# Patient Record
Sex: Male | Born: 1935
Health system: Southern US, Community
[De-identification: ages and names within clinical notes are randomized; demographics above are authoritative.]

## PROBLEM LIST (undated history)

## (undated) DIAGNOSIS — I358 Other nonrheumatic aortic valve disorders: Secondary | ICD-10-CM

## (undated) DIAGNOSIS — G935 Compression of brain: Secondary | ICD-10-CM

## (undated) DIAGNOSIS — E039 Hypothyroidism, unspecified: Secondary | ICD-10-CM

## (undated) DIAGNOSIS — I219 Acute myocardial infarction, unspecified: Secondary | ICD-10-CM

## (undated) DIAGNOSIS — I10 Essential (primary) hypertension: Secondary | ICD-10-CM

## (undated) DIAGNOSIS — I509 Heart failure, unspecified: Secondary | ICD-10-CM

## (undated) DIAGNOSIS — K56609 Unspecified intestinal obstruction, unspecified as to partial versus complete obstruction: Secondary | ICD-10-CM

## (undated) DIAGNOSIS — Z8701 Personal history of pneumonia (recurrent): Secondary | ICD-10-CM

## (undated) DIAGNOSIS — I351 Nonrheumatic aortic (valve) insufficiency: Secondary | ICD-10-CM

## (undated) DIAGNOSIS — J40 Bronchitis, not specified as acute or chronic: Secondary | ICD-10-CM

## (undated) DIAGNOSIS — E785 Hyperlipidemia, unspecified: Secondary | ICD-10-CM

## (undated) DIAGNOSIS — K219 Gastro-esophageal reflux disease without esophagitis: Secondary | ICD-10-CM

## (undated) DIAGNOSIS — K5790 Diverticulosis of intestine, part unspecified, without perforation or abscess without bleeding: Secondary | ICD-10-CM

## (undated) DIAGNOSIS — F0390 Unspecified dementia without behavioral disturbance: Secondary | ICD-10-CM

## (undated) DIAGNOSIS — H9190 Unspecified hearing loss, unspecified ear: Secondary | ICD-10-CM

## (undated) HISTORY — DX: Personal history of pneumonia (recurrent): Z87.01

## (undated) HISTORY — PX: ABDOMINAL HERNIA REPAIR: SHX539

## (undated) HISTORY — DX: Gastro-esophageal reflux disease without esophagitis: K21.9

## (undated) HISTORY — PX: CHOLECYSTECTOMY: SHX55

## (undated) HISTORY — DX: Hyperlipidemia, unspecified: E78.5

## (undated) HISTORY — DX: Diverticulosis of intestine, part unspecified, without perforation or abscess without bleeding: K57.90

## (undated) HISTORY — PX: EXPLORATORY LAPAROTOMY W/ BOWEL RESECTION: SHX1544

## (undated) HISTORY — DX: Hypothyroidism, unspecified: E03.9

## (undated) HISTORY — DX: Unspecified intestinal obstruction, unspecified as to partial versus complete obstruction: K56.609

## (undated) HISTORY — DX: Unspecified hearing loss, unspecified ear: H91.90

## (undated) HISTORY — PX: TRANSURETHRAL RESECTION OF PROSTATE: SHX73

## (undated) HISTORY — DX: Other nonrheumatic aortic valve disorders: I35.8

## (undated) HISTORY — PX: CATARACT EXTRACTION: SUR2

## (undated) HISTORY — DX: Acute myocardial infarction, unspecified: I21.9

## (undated) HISTORY — PX: EYE SURGERY: SHX253

## (undated) HISTORY — DX: Nonrheumatic aortic (valve) insufficiency: I35.1

---

## 1994-05-29 DIAGNOSIS — I219 Acute myocardial infarction, unspecified: Secondary | ICD-10-CM

## 1994-05-29 HISTORY — DX: Acute myocardial infarction, unspecified: I21.9

## 2002-11-17 ENCOUNTER — Ambulatory Visit (HOSPITAL_COMMUNITY): Admission: RE | Admit: 2002-11-17 | Discharge: 2002-11-17 | Payer: Self-pay | Admitting: Unknown Physician Specialty

## 2002-11-17 ENCOUNTER — Encounter: Payer: Self-pay | Admitting: Unknown Physician Specialty

## 2003-05-24 ENCOUNTER — Emergency Department (HOSPITAL_COMMUNITY): Admission: EM | Admit: 2003-05-24 | Discharge: 2003-05-24 | Payer: Self-pay | Admitting: Emergency Medicine

## 2003-09-09 ENCOUNTER — Inpatient Hospital Stay (HOSPITAL_COMMUNITY): Admission: EM | Admit: 2003-09-09 | Discharge: 2003-09-12 | Payer: Self-pay | Admitting: Emergency Medicine

## 2003-09-15 ENCOUNTER — Ambulatory Visit (HOSPITAL_COMMUNITY): Admission: RE | Admit: 2003-09-15 | Discharge: 2003-09-15 | Payer: Self-pay | Admitting: Unknown Physician Specialty

## 2004-10-25 ENCOUNTER — Ambulatory Visit: Payer: Self-pay | Admitting: Family Medicine

## 2005-03-02 ENCOUNTER — Ambulatory Visit: Payer: Self-pay | Admitting: Family Medicine

## 2005-04-13 ENCOUNTER — Ambulatory Visit: Payer: Self-pay | Admitting: Family Medicine

## 2005-05-28 ENCOUNTER — Emergency Department (HOSPITAL_COMMUNITY): Admission: EM | Admit: 2005-05-28 | Discharge: 2005-05-28 | Payer: Self-pay | Admitting: Emergency Medicine

## 2005-06-06 ENCOUNTER — Encounter: Payer: Self-pay | Admitting: Cardiology

## 2005-06-06 ENCOUNTER — Ambulatory Visit (HOSPITAL_COMMUNITY): Admission: RE | Admit: 2005-06-06 | Discharge: 2005-06-06 | Payer: Self-pay | Admitting: Family Medicine

## 2005-06-06 ENCOUNTER — Ambulatory Visit: Payer: Self-pay | Admitting: Family Medicine

## 2005-08-14 ENCOUNTER — Ambulatory Visit: Payer: Self-pay | Admitting: Family Medicine

## 2005-08-24 ENCOUNTER — Ambulatory Visit (HOSPITAL_COMMUNITY): Admission: RE | Admit: 2005-08-24 | Discharge: 2005-08-24 | Payer: Self-pay | Admitting: Family Medicine

## 2005-09-28 ENCOUNTER — Ambulatory Visit: Payer: Self-pay | Admitting: Family Medicine

## 2005-12-22 ENCOUNTER — Ambulatory Visit (HOSPITAL_COMMUNITY): Admission: RE | Admit: 2005-12-22 | Discharge: 2005-12-22 | Payer: Self-pay | Admitting: Ophthalmology

## 2006-02-08 ENCOUNTER — Ambulatory Visit: Payer: Self-pay | Admitting: Family Medicine

## 2006-03-12 ENCOUNTER — Ambulatory Visit (HOSPITAL_COMMUNITY): Admission: RE | Admit: 2006-03-12 | Discharge: 2006-03-12 | Payer: Self-pay | Admitting: Ophthalmology

## 2006-03-23 ENCOUNTER — Ambulatory Visit: Payer: Self-pay | Admitting: Family Medicine

## 2006-04-18 ENCOUNTER — Ambulatory Visit (HOSPITAL_COMMUNITY): Admission: RE | Admit: 2006-04-18 | Discharge: 2006-04-18 | Payer: Self-pay | Admitting: Family Medicine

## 2006-05-29 HISTORY — PX: COLONOSCOPY: SHX174

## 2006-06-04 ENCOUNTER — Ambulatory Visit: Payer: Self-pay | Admitting: Internal Medicine

## 2006-06-06 ENCOUNTER — Ambulatory Visit: Payer: Self-pay | Admitting: Internal Medicine

## 2006-06-21 ENCOUNTER — Encounter: Payer: Self-pay | Admitting: Cardiology

## 2007-06-24 ENCOUNTER — Encounter (INDEPENDENT_AMBULATORY_CARE_PROVIDER_SITE_OTHER): Payer: Self-pay | Admitting: General Surgery

## 2007-06-24 ENCOUNTER — Ambulatory Visit: Payer: Self-pay | Admitting: Internal Medicine

## 2007-06-24 ENCOUNTER — Inpatient Hospital Stay (HOSPITAL_COMMUNITY): Admission: EM | Admit: 2007-06-24 | Discharge: 2007-07-11 | Payer: Self-pay | Admitting: Emergency Medicine

## 2007-06-25 ENCOUNTER — Ambulatory Visit: Payer: Self-pay | Admitting: Internal Medicine

## 2007-07-04 ENCOUNTER — Encounter: Payer: Self-pay | Admitting: Cardiology

## 2007-07-07 ENCOUNTER — Encounter: Payer: Self-pay | Admitting: Cardiology

## 2007-07-08 ENCOUNTER — Encounter: Payer: Self-pay | Admitting: Cardiology

## 2007-07-12 ENCOUNTER — Inpatient Hospital Stay (HOSPITAL_COMMUNITY): Admission: EM | Admit: 2007-07-12 | Discharge: 2007-07-17 | Payer: Self-pay | Admitting: Emergency Medicine

## 2007-07-15 ENCOUNTER — Encounter: Payer: Self-pay | Admitting: Cardiology

## 2007-09-25 ENCOUNTER — Encounter: Payer: Self-pay | Admitting: Cardiology

## 2007-10-14 ENCOUNTER — Encounter: Payer: Self-pay | Admitting: Cardiology

## 2008-03-09 ENCOUNTER — Encounter: Payer: Self-pay | Admitting: Cardiology

## 2008-04-27 ENCOUNTER — Ambulatory Visit (HOSPITAL_COMMUNITY): Admission: RE | Admit: 2008-04-27 | Discharge: 2008-04-27 | Payer: Self-pay | Admitting: Ophthalmology

## 2009-03-10 ENCOUNTER — Encounter: Payer: Self-pay | Admitting: Cardiology

## 2009-03-17 ENCOUNTER — Encounter: Payer: Self-pay | Admitting: Cardiology

## 2009-08-16 ENCOUNTER — Encounter: Payer: Self-pay | Admitting: Cardiology

## 2009-10-26 ENCOUNTER — Encounter: Payer: Self-pay | Admitting: Cardiology

## 2009-11-23 ENCOUNTER — Encounter: Payer: Self-pay | Admitting: Cardiology

## 2009-11-30 ENCOUNTER — Ambulatory Visit: Payer: Self-pay | Admitting: Cardiology

## 2009-11-30 DIAGNOSIS — R42 Dizziness and giddiness: Secondary | ICD-10-CM | POA: Insufficient documentation

## 2009-11-30 DIAGNOSIS — G459 Transient cerebral ischemic attack, unspecified: Secondary | ICD-10-CM | POA: Insufficient documentation

## 2009-11-30 DIAGNOSIS — I10 Essential (primary) hypertension: Secondary | ICD-10-CM | POA: Insufficient documentation

## 2009-11-30 DIAGNOSIS — E785 Hyperlipidemia, unspecified: Secondary | ICD-10-CM | POA: Insufficient documentation

## 2009-11-30 DIAGNOSIS — I252 Old myocardial infarction: Secondary | ICD-10-CM

## 2009-11-30 DIAGNOSIS — E119 Type 2 diabetes mellitus without complications: Secondary | ICD-10-CM | POA: Insufficient documentation

## 2009-11-30 DIAGNOSIS — I2510 Atherosclerotic heart disease of native coronary artery without angina pectoris: Secondary | ICD-10-CM | POA: Insufficient documentation

## 2010-06-19 ENCOUNTER — Encounter: Payer: Self-pay | Admitting: Family Medicine

## 2010-06-30 NOTE — Assessment & Plan Note (Signed)
Summary: NP-CAD   Visit Type:  Follow-up Primary Provider:  Dr. Samuel Jester  CC:  Questionable CAD.  History of Present Illness: The patient presents for evaluation of questionable coronary disease. He was being seen routinely by another cardiology group. His family reports a history of an old myocardial infarction identified by some form of cardiovascular testing but apparently asymptomatic. I do not have any of these records. His wife says there was a catheterization many years ago but the patient does not recall this. He does not get chest discomfort, neck or arm discomfort. He does not have palpitations, presyncope or syncope. He does not have PND or orthopnea. I do note that at the time of abdominal surgery in the past he had some shortness of breath and questionable heart failure. In 2009 at that time an echo demonstrated LVH with a well-preserved ejection fraction and no mention of wall motion abnormalities.  Preventive Screening-Counseling & Management  Alcohol-Tobacco     Smoking Status: never  Current Medications (verified): 1)  Lotrel 5-20 Mg Caps (Amlodipine Besy-Benazepril Hcl) .... Take 1 Tablet By Mouth Once A Day 2)  Omeprazole 20 Mg Cpdr (Omeprazole) .... Take 1 Tablet By Mouth Once A Day 3)  Levoxyl 75 Mcg Tabs (Levothyroxine Sodium) .... Take 1 Tablet By Mouth Once A Day 4)  Lasix 40 Mg Tabs (Furosemide) .... Take 1 Tablet By Mouth Once A Day 5)  Simvastatin 20 Mg Tabs (Simvastatin) .... Take 1 Tablet By Mouth Once A Day 6)  Toprol Xl 50 Mg Xr24h-Tab (Metoprolol Succinate) .... Take 1 Tablet By Mouth Once A Day 7)  Aspir-Low 81 Mg Tbec (Aspirin) .... Take 1 Tablet By Mouth Once A Day 8)  Fish Oil 1000 Mg Caps (Omega-3 Fatty Acids) .... Take 1 Tablet By Mouth Two Times A Day 9)  Natural Vegetable Laxative 65-325 Mg Tabs (Senna) .... Take 1 Tab By Mouth At Bedtime As Needed 10)  Amitriptyline Hcl 25 Mg Tabs (Amitriptyline Hcl) .... Take 1 Tab By Mouth At  Bedtime  Allergies (verified): No Known Drug Allergies  Past History:  Past Medical History:  1. Incarcerated hernia.   2. Pneumonia.   3. Dysphagia.   4. Hyperglycemia.   5. Hypertension.   6. Hemorrhoids.   7. Questionable history of myocardial infarction.   8. Gastroesophageal reflux disease.      Past Surgical History: Exploratory laparotomy, small-bowel resection, repair of   ventral hernia with 6 x 16-cm MTF graft.  TURP Cholecystectomy     Family History: Father died of old age.  Mother lived into her 52s.    Social History: He lives with spouse,quit tobacco 60 years ago, drinking or drug abuse.     Smoking Status:  never  Review of Systems       As stated in the HPI and negative for all other systems.   Vital Signs:  Patient profile:   77 year old male Height:      65 inches Weight:      190.75 pounds BMI:     31.86 Pulse rate:   80 / minute BP sitting:   124 / 75  (left arm) Cuff size:   regular  Vitals Entered By: Hoover Brunette, LPN (November 30, 6293 3:22 PM)  Nutrition Counseling: Patient's BMI is greater than 25 and therefore counseled on weight management options. CC: Questionable CAD Is Patient Diabetic? No Comments establish  care with [place].  [facility] transferred to [place], no longer accept insurance.  Physical Exam  General:  Well developed, well nourished, in no acute distress. Head:  normocephalic and atraumatic Eyes:  PERRLA/EOM intact; conjunctiva and lids normal. Mouth:  Edentulous. Oral mucosa normal. Neck:  Neck supple, no JVD. No masses, thyromegaly or abnormal cervical nodes. Chest Wall:  no deformities or breast masses noted Lungs:  Clear bilaterally to auscultation and percussion. Abdomen:  Bowel sounds positive; abdomen soft and non-tender without masses, organomegaly, or hernias noted. No hepatosplenomegaly. Msk:  Back normal, normal gait. Muscle strength and tone normal. Extremities:  chronic venous stasis changes without  edema Neurologic:  Alert and oriented x 3.  Greatly decreased hearing Skin:  Intact without lesions or rashes. Cervical Nodes:  no significant adenopathy Axillary Nodes:  no significant adenopathy Inguinal Nodes:  no significant adenopathy Psych:  Normal affect.   Detailed Cardiovascular Exam  Neck    Carotids: Carotids full and equal bilaterally without bruits.      Neck Veins: Normal, no JVD.    Heart    Inspection: no deformities or lifts noted.      Palpation: normal PMI with no thrills palpable.      Auscultation: regular rate and rhythm, S1, S2 without murmurs, rubs, gallops, or clicks.    Vascular    Abdominal Aorta: no palpable masses, pulsations, or audible bruits.      Femoral Pulses: normal femoral pulses bilaterally.      Pedal Pulses: normal pedal pulses bilaterally.      Radial Pulses: normal radial pulses bilaterally.      Peripheral Circulation: no clubbing, cyanosis, or edema noted with normal capillary refill.     EKG  Procedure date:  11/30/2009  Findings:      Sinus rhythm, rate 79, axis within normal limits, intervals within normal limits, no acute ST-T wave changes  Impression & Recommendations:  Problem # 1:  CORONARY ATHEROSCLEROSIS NATIVE CORONARY ARTERY (ICD-414.01) He has a vague history of coronary disease and he does not recall any details or report of previous infarct. I will review records from his previous cardiology group. However, he is having no symptoms and no further testing would be indicated. He can continue with risk reduction alone. Orders: EKG w/ Interpretation (93000)  Problem # 2:  HYPERTENSION (ICD-401.9) His blood pressure is controlled on the medications as listed. I will suggest no changes.  Problem # 3:  OTHER AND UNSPECIFIED HYPERLIPIDEMIA (ICD-272.4) Per Dr. Charm Barges. Again I will review old records. Understanding whether he has prior coronary disease would affect our goals of therapy.  Patient Instructions: 1)  follow  up as needed

## 2010-06-30 NOTE — Letter (Signed)
Summary: External Correspondence/ OFFICE VISIT [facility]  External Correspondence/ OFFICE VISIT [facility]   Imported By: Dorise Hiss 12/08/2009 15:57:01  _____________________________________________________________________  External Attachment:    Type:   Image     Comment:   External Document

## 2010-06-30 NOTE — Letter (Signed)
Summary: External Correspondence/ OFFICE VISIT [facility]  External Correspondence/ OFFICE VISIT [facility]   Imported By: Dorise Hiss 11/24/2009 10:01:05  _____________________________________________________________________  External Attachment:    Type:   Image     Comment:   External Document

## 2010-06-30 NOTE — Medication Information (Signed)
Summary: RX Folder/ MED LIST [facility]  RX Folder/ MED LIST [facility]   Imported By: Dorise Hiss 11/24/2009 10:19:21  _____________________________________________________________________  External Attachment:    Type:   Image     Comment:   External Document

## 2010-06-30 NOTE — Letter (Signed)
Summary: External Correspondence/ [facility]  External Correspondence/ [facility]   Imported By: Dorise Hiss 11/30/2009 12:40:45  _____________________________________________________________________  External Attachment:    Type:   Image     Comment:   External Document

## 2010-07-01 NOTE — Letter (Signed)
Summary: External Correspondence/ OFFICE VISITS 2009 [facility]  External Correspondence/ OFFICE VISITS 2009 [facility]   Imported By: Dorise Hiss 12/13/2009 12:24:39  _____________________________________________________________________  External Attachment:    Type:   Image     Comment:   External Document

## 2010-08-10 ENCOUNTER — Inpatient Hospital Stay (HOSPITAL_COMMUNITY)
Admission: RE | Admit: 2010-08-10 | Discharge: 2010-08-16 | DRG: 394 | Disposition: A | Discharge status: Home / Self Care | Payer: Medicare Other | Source: Ambulatory Visit | Attending: Internal Medicine | Admitting: Internal Medicine

## 2010-08-10 ENCOUNTER — Emergency Department (HOSPITAL_COMMUNITY): Payer: Medicare Other

## 2010-08-10 DIAGNOSIS — H9190 Unspecified hearing loss, unspecified ear: Secondary | ICD-10-CM | POA: Diagnosis present

## 2010-08-10 DIAGNOSIS — E876 Hypokalemia: Secondary | ICD-10-CM | POA: Diagnosis present

## 2010-08-10 DIAGNOSIS — D509 Iron deficiency anemia, unspecified: Secondary | ICD-10-CM | POA: Diagnosis present

## 2010-08-10 DIAGNOSIS — E871 Hypo-osmolality and hyponatremia: Secondary | ICD-10-CM | POA: Diagnosis present

## 2010-08-10 DIAGNOSIS — E039 Hypothyroidism, unspecified: Secondary | ICD-10-CM | POA: Diagnosis present

## 2010-08-10 DIAGNOSIS — K219 Gastro-esophageal reflux disease without esophagitis: Secondary | ICD-10-CM | POA: Diagnosis present

## 2010-08-10 DIAGNOSIS — I10 Essential (primary) hypertension: Secondary | ICD-10-CM | POA: Diagnosis present

## 2010-08-10 DIAGNOSIS — K430 Incisional hernia with obstruction, without gangrene: Principal | ICD-10-CM | POA: Diagnosis present

## 2010-08-10 LAB — DIFFERENTIAL
Eosinophils Absolute: 0 10*3/uL (ref 0.0–0.7)
Eosinophils Relative: 0 % (ref 0–5)
Lymphs Abs: 0.5 10*3/uL — ABNORMAL LOW (ref 0.7–4.0)
Monocytes Relative: 8 % (ref 3–12)
Neutrophils Relative %: 86 % — ABNORMAL HIGH (ref 43–77)

## 2010-08-10 LAB — CBC
HCT: 35.6 % — ABNORMAL LOW (ref 39.0–52.0)
MCH: 23.4 pg — ABNORMAL LOW (ref 26.0–34.0)
MCV: 71.8 fL — ABNORMAL LOW (ref 78.0–100.0)
Platelets: 216 10*3/uL (ref 150–400)
RBC: 4.96 MIL/uL (ref 4.22–5.81)

## 2010-08-10 LAB — COMPREHENSIVE METABOLIC PANEL
Albumin: 3.5 g/dL (ref 3.5–5.2)
BUN: 17 mg/dL (ref 6–23)
Chloride: 99 mEq/L (ref 96–112)
Creatinine, Ser: 1.27 mg/dL (ref 0.4–1.5)
GFR calc non Af Amer: 54 mL/min — ABNORMAL LOW (ref >60)
Glucose, Bld: 192 mg/dL — ABNORMAL HIGH (ref 70–99)
Total Bilirubin: 0.5 mg/dL (ref 0.3–1.2)

## 2010-08-10 LAB — LIPASE, BLOOD: Lipase: 18 U/L (ref 11–59)

## 2010-08-10 LAB — POCT CARDIAC MARKERS

## 2010-08-11 ENCOUNTER — Inpatient Hospital Stay (HOSPITAL_COMMUNITY): Payer: Medicare Other

## 2010-08-11 LAB — CBC
HCT: 34.8 % — ABNORMAL LOW (ref 39.0–52.0)
Hemoglobin: 11.4 g/dL — ABNORMAL LOW (ref 13.0–17.0)
MCHC: 32.8 g/dL (ref 30.0–36.0)
MCV: 71.5 fL — ABNORMAL LOW (ref 78.0–100.0)
RDW: 15.9 % — ABNORMAL HIGH (ref 11.5–15.5)

## 2010-08-11 LAB — COMPREHENSIVE METABOLIC PANEL
ALT: 46 U/L (ref 0–53)
AST: 45 U/L — ABNORMAL HIGH (ref 0–37)
Albumin: 3.4 g/dL — ABNORMAL LOW (ref 3.5–5.2)
CO2: 23 mEq/L (ref 19–32)
Calcium: 8.9 mg/dL (ref 8.4–10.5)
Creatinine, Ser: 1.15 mg/dL (ref 0.4–1.5)
GFR calc Af Amer: >60 mL/min (ref >60)
Sodium: 133 mEq/L — ABNORMAL LOW (ref 135–145)
Total Protein: 6.5 g/dL (ref 6.0–8.3)

## 2010-08-11 LAB — URINE MICROSCOPIC-ADD ON

## 2010-08-11 LAB — GLUCOSE, CAPILLARY: Glucose-Capillary: 158 mg/dL — ABNORMAL HIGH (ref 70–99)

## 2010-08-11 LAB — URINALYSIS, ROUTINE W REFLEX MICROSCOPIC
Glucose, UA: 100 mg/dL — AB
Ketones, ur: NEGATIVE mg/dL
Protein, ur: 30 mg/dL — AB
Urobilinogen, UA: 0.2 mg/dL (ref 0.0–1.0)

## 2010-08-11 LAB — DIFFERENTIAL
Eosinophils Absolute: 0 10*3/uL (ref 0.0–0.7)
Eosinophils Relative: 0 % (ref 0–5)
Lymphocytes Relative: 13 % (ref 12–46)
Lymphs Abs: 1.2 10*3/uL (ref 0.7–4.0)
Monocytes Relative: 13 % — ABNORMAL HIGH (ref 3–12)

## 2010-08-11 LAB — IRON AND TIBC: Iron: <10 ug/dL — ABNORMAL LOW (ref 42–135)

## 2010-08-11 MED ORDER — IOHEXOL 300 MG/ML  SOLN
100.0000 mL | Freq: Once | INTRAMUSCULAR | Status: AC | PRN
  Administered 2010-08-11: 100 mL via INTRAVENOUS

## 2010-08-12 LAB — COMPREHENSIVE METABOLIC PANEL
ALT: 47 U/L (ref 0–53)
AST: 26 U/L (ref 0–37)
Albumin: 3 g/dL — ABNORMAL LOW (ref 3.5–5.2)
Alkaline Phosphatase: 48 U/L (ref 39–117)
Calcium: 8.4 mg/dL (ref 8.4–10.5)
GFR calc Af Amer: 58 mL/min — ABNORMAL LOW (ref >60)
Glucose, Bld: 155 mg/dL — ABNORMAL HIGH (ref 70–99)
Potassium: 3.7 mEq/L (ref 3.5–5.1)
Sodium: 134 mEq/L — ABNORMAL LOW (ref 135–145)
Total Protein: 6.1 g/dL (ref 6.0–8.3)

## 2010-08-12 LAB — URINE CULTURE
Culture  Setup Time: 201203160142
Culture: NO GROWTH
Special Requests: NEGATIVE

## 2010-08-12 LAB — MAGNESIUM: Magnesium: 2.3 mg/dL (ref 1.5–2.5)

## 2010-08-12 LAB — DIFFERENTIAL
Eosinophils Relative: 0 % (ref 0–5)
Lymphocytes Relative: 25 % (ref 12–46)
Lymphs Abs: 2.2 10*3/uL (ref 0.7–4.0)
Monocytes Relative: 15 % — ABNORMAL HIGH (ref 3–12)
WBC Morphology: INCREASED

## 2010-08-12 LAB — CBC
HCT: 35 % — ABNORMAL LOW (ref 39.0–52.0)
Hemoglobin: 11.5 g/dL — ABNORMAL LOW (ref 13.0–17.0)
MCHC: 32.9 g/dL (ref 30.0–36.0)
MCV: 71.6 fL — ABNORMAL LOW (ref 78.0–100.0)

## 2010-08-12 LAB — T4, FREE: Free T4: 1.05 ng/dL (ref 0.80–1.80)

## 2010-08-12 LAB — GLUCOSE, CAPILLARY: Glucose-Capillary: 170 mg/dL — ABNORMAL HIGH (ref 70–99)

## 2010-08-12 LAB — TSH: TSH: 3.641 u[IU]/mL (ref 0.350–4.500)

## 2010-08-13 LAB — GLUCOSE, CAPILLARY
Glucose-Capillary: 156 mg/dL — ABNORMAL HIGH (ref 70–99)
Glucose-Capillary: 101 mg/dL — ABNORMAL HIGH (ref 70–99)

## 2010-08-13 LAB — BASIC METABOLIC PANEL
CO2: 22 mEq/L (ref 19–32)
Chloride: 113 mEq/L — ABNORMAL HIGH (ref 96–112)
GFR calc Af Amer: >60 mL/min (ref >60)
Sodium: 140 mEq/L (ref 135–145)

## 2010-08-13 LAB — MAGNESIUM: Magnesium: 2.5 mg/dL (ref 1.5–2.5)

## 2010-08-14 LAB — DIFFERENTIAL
Basophils Relative: 1 % (ref 0–1)
Eosinophils Absolute: 0.1 10*3/uL (ref 0.0–0.7)
Lymphs Abs: 2.7 10*3/uL (ref 0.7–4.0)
Monocytes Relative: 9 % (ref 3–12)
Neutro Abs: 4 10*3/uL (ref 1.7–7.7)
Neutrophils Relative %: 53 % (ref 43–77)

## 2010-08-14 LAB — GLUCOSE, CAPILLARY
Glucose-Capillary: 148 mg/dL — ABNORMAL HIGH (ref 70–99)
Glucose-Capillary: 113 mg/dL — ABNORMAL HIGH (ref 70–99)
Glucose-Capillary: 125 mg/dL — ABNORMAL HIGH (ref 70–99)

## 2010-08-14 LAB — BASIC METABOLIC PANEL
CO2: 21 mEq/L (ref 19–32)
Chloride: 110 mEq/L (ref 96–112)
GFR calc Af Amer: >60 mL/min (ref >60)
Potassium: 4.1 mEq/L (ref 3.5–5.1)

## 2010-08-14 LAB — CBC
Hemoglobin: 9.7 g/dL — ABNORMAL LOW (ref 13.0–17.0)
MCH: 24 pg — ABNORMAL LOW (ref 26.0–34.0)
Platelets: 200 10*3/uL (ref 150–400)
RBC: 4.05 MIL/uL — ABNORMAL LOW (ref 4.22–5.81)
WBC: 7.6 10*3/uL (ref 4.0–10.5)

## 2010-08-15 LAB — GLUCOSE, CAPILLARY: Glucose-Capillary: 149 mg/dL — ABNORMAL HIGH (ref 70–99)

## 2010-08-16 LAB — GLUCOSE, CAPILLARY
Glucose-Capillary: 128 mg/dL — ABNORMAL HIGH (ref 70–99)
Glucose-Capillary: 108 mg/dL — ABNORMAL HIGH (ref 70–99)

## 2010-09-08 NOTE — H&P (Signed)
NAME:  [name], [name] NO.:  [id number]  MEDICAL RECORD NO.:  [id number]           PATIENT TYPE:  I  LOCATION:  A336                          FACILITY:  [facility]  PHYSICIAN:  Osvaldo Shipper, MD     DATE OF BIRTH:  12-03-1924  DATE OF ADMISSION:  08/10/2010 DATE OF DISCHARGE:  LH                             HISTORY & PHYSICAL   PRIMARY CARE PHYSICIAN:  Ernestina Penna, MD  SURGEON:  Barbaraann Barthel, MD  CARDIOLOGIST:  Nicki Guadalajara, MD at [facility].  ADMISSION DIAGNOSES: 1. Small bowel obstruction. 2. History of hypertension. 3. History of hernia. 4. History of hypothyroidism.  CHIEF COMPLAINT:  Abdominal pain, nausea, vomiting since yesterday.  HISTORY OF PRESENT ILLNESS:  The patient is an 77 year old Caucasian male who has a history of hypertension who otherwise is in good state of health.  He was in his usual state of health until day before yesterday night when he started having diarrhea.  He had 2-3 episodes last night, then yesterday morning he had 2-3 episodes.  He was also having some lower abdominal discomfort.  Then after yesterday morning's episodes of diarrhea, he stopped having any kind of bowel movements even no gas passage.  Then last night, he started throwing up yellowish material without any blood.  The pain is in the lower abdomen.  He is unable to quantify the pain.  The patient has just been given some Ativan and he is extremely hard of hearing, so history is very difficult to obtain from the patient, most of the history was obtained from his wife and his daughter.  Essentially, these symptoms started 2 days ago.  The daughter was concerned this yesterday morning because of shortness of his breath and she took him to the doctor's office who told him that he may have viral illness.  However, the daughter denies any complaints of fever. There was no history of any cough or chest pains.  Currently, the patient is  sedated, appears to be comfortable.  He still has some nausea.  MEDICATIONS AT HOME: 1. Dexilant 60 mg once a day. 2. Equate laxative once a day as needed. 3. Fish oil one tablet daily. 4. Aspirin 81 mg daily. 5. Levothyroxine 75 mcg daily. 6. Simvastatin 20 mg daily. 7. Furosemide 40 mg daily. 8. Metoprolol ER 50 mg once a day. 9. Amlodipine and benazepril 5/20 one tablet daily and he takes     another pill every evening for fever, the family does not know what     it is.  ALLERGIES:  No known drug allergies.  PAST MEDICAL HISTORY:  Positive for hemorrhoids, hernia, hypertension, hypokalemia, hyponatremia.  They thinks he may have had a MI many many years ago; however, they are not sure.  He has never had a stent placed or any kind of surgery done to his heart.  He is extremely hard of hearing.  He has had several hernia repairs with the last one was in January 2009, for which he underwent exploratory laparotomy, small bowel resection and repair of the ventral hernia.  This was done by  Dr. Malvin Johns.  He has had a cholecystectomy done as well.  SOCIAL HISTORY:  Lives in [place] with his wife.  His daughter lives close by.  No smoking, alcohol or illicit drug use.  He still drives, does not need any assistive devices to ambulate.  FAMILY HISTORY:  Positive for CHF and MI.  REVIEW OF SYSTEMS:  GENERAL:  Positive for weakness, malaise.  HEENT: Unremarkable.  CARDIOVASCULAR:  Unremarkable.  RESPIRATORY:  As in HPI. GI:  As in HPI.  GU:  Unremarkable.  Neurologic:  Unremarkable.  Once again review of systems is limited mostly because of his hard-of-hearing and his sedated status.  PHYSICAL EXAMINATION:  VITAL SIGNS:  Temperature 98.5, blood pressure 145/76, heart rate 82, respiratory rate 20, saturation 98% on room air. GENERAL:  This is an obese white male, elderly, in no distress, sedated. HEENT:  Head is normocephalic, atraumatic.  Pupils are equal and reacting.  No pallor.   No icterus.  Oral mucous membrane is moist.  No oral lesions are noted. NECK:  Soft and supple.  No thyromegaly is appreciated.  No cervical, supraclavicular, inguinal lymphadenopathy is present. LUNGS:  Reveal decreased air entry at the right base, otherwise mostly clear to auscultation. CARDIOVASCULAR:  S1-S2 is normal, regular.  No S3, S4 rubs, murmurs or bruits. ABDOMEN:  Distended, soft, minimal tenderness.  Bowel sounds are present, but very sluggish.  No masses or organomegaly appreciated. GU:  Deferred. MUSCULOSKELETAL:  Normal muscle mass and tone. NEUROLOGIC:  He is alert.  He is sedated rather easily arousable.  No obvious focal neurological deficits are present. SKIN:  Does not reveal any rashes.  LABORATORY DATA:  His white cell count is normal at 8.5, hemoglobin 7.6, MCV 71, platelet count is 216.  Sodium is 133, glucose is 192, BUN is 17, creatinine 1.27, lipase is 18.  Cardiac enzymes are negative x1.  IMAGING STUDIES:  He had a chest x-ray, which showed minimal bibasilar atelectasis with clear lungs, new right pleural thickening was noted without any focal mass, mild cardiomegaly and thoracic kyphosis is noted.  Abdominal films that were done which showed diffuse distention of the small bowel loops suspicious for small bowel obstruction.  EKG was done, which showed sinus rhythm at 82 with normal axis, intervals appear to be in the normal range.  No concerning Q-waves.  No concerning ST changes.  Nonspecific T-wave changes are noted.  ASSESSMENT:  This is an 77 year old Caucasian male who presents to the hospital with abdominal pain, nausea, vomiting.  He is noted to have a possible small bowel obstruction based on abdominal film.  He has had numerous hernia repair surgeries in the past.  So, his bowel obstruction could be secondary to adhesions or hernia.  His abdomen is not quite tender.  At this time, it is quite soft.  He also has microcytic anemia as well and  mild hyponatremia.  PLAN: 1. Small bowel obstruction.  NG tube will be placed.  We will proceed     with the CT of the abdomen and pelvis.  We will consult Dr.     Malvin Johns in the morning as he has seen him in the past.  The     patient is currently stable.  If his status changes, we will call     the on-call surgeon.  Dr. Effie Shy had already discussed this case     with Dr. Malvin Johns. 2. Microcytic anemia.  Anemia panel will be checked in the morning. 3. Mild hyponatremia,  will be corrected with IV fluids. 4. History of hypertension.  We will hold off on his antihypertensive     medications for now and monitor his blood pressures closely. 5. Nonspecific changes in the chest x-ray.  We will defer management     to his primary care physician. 6. Code status.  He is a full code.  This was discussed with daughter.     PPI will be initiated.  DVT prophylaxis will be initiated.  Further management decisions will depend on results of further testing and patient's response to treatment.  Osvaldo Shipper, MD     GK/MEDQ  D:  08/11/2010  T:  08/11/2010  Job:  [id number]  cc:   Barbaraann Barthel, M.D. Fax: [phone]  Ernestina Penna, M.D. Fax: [phone]  Electronically Signed by Osvaldo Shipper MD on 09/08/2010 11:02:11 PM

## 2010-09-14 NOTE — Discharge Summary (Signed)
NAME:  [name], [name] NO.:  [id number]  MEDICAL RECORD NO.:  [id number]           PATIENT TYPE:  I  LOCATION:  A336                          FACILITY:  [facility]  PHYSICIAN:  [name], MDDATE OF BIRTH:  07-12-1924  DATE OF ADMISSION:  08/10/2010 DATE OF DISCHARGE:  03/20/2012LH                              DISCHARGE SUMMARY   DISCHARGE DIAGNOSES: 1. Small-bowel obstruction, resolved with conservative management. 2. Large ventral hernia. 3. History of exploratory laparotomy, small bowel resection, and     repair of ventral hernia with a 6 x 16 cm MTF graft by Dr. Malvin Nichols     in 2009 for incarcerated ventral hernia, recurrent incisional 4. Small focal Richter hernia arising from right side of broad-based     anterior bulge in the abdominal wall involving less than 3 cm of     mid ileum by CAT scan. 5. Hypertension. 6. Hypothyroidism. 7. Gastroesophageal reflux disease. 8. Hearing impaired. 9. Mild iron deficiency anemia, needs outpatient followup and     treatment.  DISCHARGE MEDICATIONS:  MiraLax 17 grams a day, hold for diarrhea.  Stop Dexilant (or omeprazole, both are listed on his home medicine reconciliation form).  Continue Colace 100 mg a day, aspirin 81 mg a day, levothyroxine 75 mcg a day, simvastatin 20 mg a day, furosemide 40 mg a day, metoprolol succinate 50 mg daily, Lotrel 5/20 one p.o. daily, clonidine 0.1 mg p.o. daily, fish oil capsules 1 gel cap daily.  CONDITION:  Stable.  ACTIVITY:  Ad lib.  Follow up with Dr. Marilynn Nichols at [facility].  I have asked the ward secretary to schedule an appointment if possible.  If any problems, I would defer to Dr. Lovell Nichols.  CONSULTATIONS:  Dr. Lovell Nichols.  PROCEDURES:  None.  DIET:  Should be heart-healthy.  LABORATORY DATA:  CBC on admission significant for a hemoglobin of 11.4, hematocrit 34.8, MCV 71.  Complete metabolic panel on admission significant for a glucose of 174,  sodium of 133, SGOT of 45, albumin of 3.4.  Blood glucose normalized without intervention.  TSH 3.6, free T4 normal.  Ferritin 20, iron less than 20.  Vitamin B12, folate were normal.  Urinalysis showed 100 glucose, small blood, 30 protein, otherwise negative.  Urine culture negative.  DIAGNOSTICS:  On admission, chest x-ray showed minimal atelectasis, new right-sided pleural thickening, could represent rib injury or soft tissue mild cardiomegaly, thoracic kyphosis.  Two views of the abdomen showed diffuse distention of small bowel loops with fluid and air measuring up to 5.6 cm in diameter.  Residual air noted throughout the colon suspicious for early high-grade small bowel obstruction.  CT of the abdomen and pelvis showed small focal Richter hernia arising from the right side of a very broad-based anterior bulge and abdominal wall involving less than 3 cm segment of mid ileum, may be amendable to nonoperative reduction, gradual decompression of distal small bowel likely reflects some degree of dysmotility due to the Richter hernia. More proximal small bowel is diffusely distended.  No evidence of significant bowel obstruction, no free air, large anterior abdominal wall bulge noted with associated  lower abdominal wall mesh, bulge is much larger than on the prior study.  Distention of stomach with fluid and air with fluid noted in the distal esophagus, small bilateral inguinal hernias containing only fat, bibasilar atelectasis.  Left humerus x-ray showed no contrast extravasation.  He had repeat abdominal films for NG tube placement.  HISTORY AND HOSPITAL COURSE:  Please see H&P for details.  [name] is a pleasant elderly 77 year old white male who presented with abdominal pain, nausea, vomiting for about a day.  He has a history of ventral hernia that was repaired by Dr. Malvin Nichols in 2009.  He.  The patient on exam in the emergency room had normal vital signs.  He had a distended,  soft abdomen with sluggish bowel sounds, massively large ventral hernia.  X-rays were consistent with obstruction.  CAT scan showed Richter's hernia.  Dr. Effie Nichols discussed the case with Dr. Malvin Nichols who recommended admission to the hospitalist service and he would consult.  Apparently there was some back and forth and eventually Dr. Lovell Nichols kindly consulted as he was on-call for surgery.  The patient had an NG tube placed.  The patient luckily started having bowel movements. His abdominal pain and vomiting resolved.  He was tolerating a regular diet after removal of NG tube.  It was the opinion of Dr. Lovell Nichols that the patient, if required surgery, it would need to be done at [facility] for a composite mesh repair.  He recommended that the patient follow up with Dr. Marilynn Nichols at [facility] and I have asked the secretary to make an appointment.  If he has no another problem with obstruction or partial obstruction, I recommend that the patient be triaged to a tertiary center in case he requires a complicated procedure as mentioned above.  His medical problems remained stable.  He has no history of diabetes but did have some initial hyperglycemia related to stress response.  Total time on the day of discharge is greater than 30 minutes.     [name] L. Lendell Caprice, MD     CLS/MEDQ  D:  08/16/2010  T:  08/16/2010  Job:  [id number]  cc:   [name], M.D. Fax: [phone]  [name], M.D. Fax: [phone]  [name], M.D. Fax: [phone]  Electronically Signed by Craig Curb MD on 09/14/2010 09:28:13 PM

## 2010-10-11 NOTE — Group Therapy Note (Signed)
NAME:  Craig, Nichols NO.:  [id number]   MEDICAL RECORD NO.:  [id number]          PATIENT TYPE:  INP   LOCATION:  A203                          FACILITY:  [facility]   PHYSICIAN:  Edward L. Juanetta Gosling, M.D.DATE OF BIRTH:  June 08, 1924   DATE OF PROCEDURE:  DATE OF DISCHARGE:                                 PROGRESS NOTE   Mr. Record says he is feeling better.  He has no new complaints.   PHYSICAL EXAMINATION:  Today shows temperature is 98.5, pulse 81,  respirations 24, blood pressure 119/66, O2 sats 95%.  Blood sugar was  328 at 2000 hours last night and 275 this morning.  His chest is clearer.  He still has rhonchi but less than yesterday.  Less labored.   His white count is 9800, hemoglobin 9.3, platelets 287,000.  Electrolytes normal.  His albumin is 1.8, BUN 28, creatinine 1.35.   ASSESSMENT:  He is better from a pulmonary point-of-view.  He still has  significant problems with his nutritional status, et Karie Soda.  He is on  clear liquids now and may be able to advance.      Edward L. Juanetta Gosling, M.D.  Electronically Signed     ELH/MEDQ  D:  07/05/2007  T:  07/05/2007  Job:  [id number]

## 2010-10-11 NOTE — Group Therapy Note (Signed)
NAME:  [name], [name] NO.:  [id number]   MEDICAL RECORD NO.:  [id number]          PATIENT TYPE:  INP   LOCATION:  A203                          FACILITY:  [facility]   PHYSICIAN:  Dorris Singh, DO    DATE OF BIRTH:  06/09/1924   DATE OF PROCEDURE:  07/03/2007  DATE OF DISCHARGE:                                 PROGRESS NOTE   Patient was seen today resting in bed.  Has no complaints; however,  states that he is having a difficult time breathing.  We will go ahead  and obtain a chest x-ray.  He is currently pulse-oxing at 92% on 2.5  liters, which has remained consistent but will go ahead and continue to  monitor him as well.   Also, I discussed his case with Dr. Malvin Johns.  We will go ahead and  increase his diet to see how he does after surgery, and if he continues  to increase, we will consider discontinuing his TPN as well, although we  will just monitor that.   PHYSICAL EXAMINATION:  Temperature 98.3, pulse 79, respirations 20,  blood pressure 130/81.  Patient is an 77 year old Caucasian male who is well-developed and well-  nourished in no acute distress.  He is hard of hearing.  HEART:  Regular rate and rhythm.  LUNGS:  Some rhonchi present but no crackles.  ABDOMEN:  Still distended, soft, but with appropriate tenderness.  EXTREMITIES:  Positive pulses.  No ecchymosis or edema.   His blood work for today, he had a white count of 10.9, hemoglobin 10,  hematocrit 28.7, platelet count 252.  His chemistries:  Sodium 139,  potassium 4, chloride 112, glucose 212, CO2 21, BUN 28.  Creatinine is  1.31.   ASSESSMENT/PLAN:  1. Increased shortness of breath:  Will go ahead and get a chest x-ray      to determine if the patient has developed pneumonia.  He was just      recently extubated a couple of days ago,  but he is currently on      vancomycin and Zosyn, so he is on broad-spectrum coverage.  He was      started on steroids yesterday as well and will see if he  continues      to improve.  We will continue to monitor his sputum cultures, which      have not come in at this point in time.  He is status post surgery      for strangulated hernia repair.  He seems to be doing well.  Dr.      Malvin Johns has recommended that we increase his diet to clear      liquids.  We will see how he handles that.  2. Hyperglycemia:  Patient still is on TPN.  He was started on Lantus      yesterday and also started on steroids, so we will just continue to      follow this, and as soon as we can discontinue the TPN, which I      hope tomorrow, we will do that.  3.  Hypertension and history of coronary artery disease are stable.  4. Hypothyroidism:  He will continue on Synthroid.  5. Physical therapy will continue to work with him.  6. Patient was started on IV Cardizem by Dr. Domingo Sep for      tachycardia.  He does not have a history of atrial fibrillation,      but cardiology has been following him as well, so we will continue      to monitor and make any      changes as necessary.  We will continue to do the IV Cardizem until      we switch him to p.o. medications.  Hopefully, tomorrow we will be      able to switch everything over to p.o. and get him on some p.o.      antibiotics and see how he continues to improve.      Dorris Singh, DO  Electronically Signed     CB/MEDQ  D:  07/03/2007  T:  07/03/2007  Job:  [id number]

## 2010-10-11 NOTE — Discharge Summary (Signed)
NAME:  [name], [name] NO.:  [id number]   MEDICAL RECORD NO.:  [id number]          PATIENT TYPE:  INP   LOCATION:  A203                          FACILITY:  [facility]   PHYSICIAN:  Dorris Singh, DO    DATE OF BIRTH:  [birth year]   DATE OF ADMISSION:  06/23/2007  DATE OF DISCHARGE:  LH                               DISCHARGE SUMMARY   ADDENDUM TO DISCHARGE SUMMARY:  After discussion with Dr. Malvin Johns in the current state of the patient's  deconditioning, we discussed at length the possibility that due to the  patient's deconditioning and his family's ability to care for him what  would be the best course of action, even though family would like take  him home.  Dr. Malvin Johns and case management and myself talked with  family members and have recommended he go to skilled nursing,.  We are  concerned about wound dehiscence and also a recurrence of any  respiratory issues that he was having.  So, upon discharge, we will have  the new hospitalist do a discharge summary and any changes in his  medications at that point.      Dorris Singh, DO  Electronically Signed     CB/MEDQ  D:  07/10/2007  T:  07/10/2007  Job:  [phone]

## 2010-10-11 NOTE — Group Therapy Note (Signed)
NAME:  [name] NO.:  [id number]   MEDICAL RECORD NO.:  [id number]          PATIENT TYPE:  INP   LOCATION:  A203                          FACILITY:  [facility]   PHYSICIAN:  Edward L. Juanetta Gosling, M.D.DATE OF BIRTH:  05/11/1925   DATE OF PROCEDURE:  DATE OF DISCHARGE:                                 PROGRESS NOTE   Mr. Thum is a patient of the [facility] hospitalist team who has had  respiratory failure after surgery.  He seems to be doing better in  general.   PHYSICAL EXAMINATION:  VITALS:  His temperature is 97.6, pulse 68,  respirations 24, blood pressure 139/71, O2 sats 94%.  His blood sugar  225.  CHEST: Still shows some rhonchi but not as much as before.   LABORATORY DATA:  His white blood count 7500, hemoglobin 9.1, platelets  301. Electrolytes, glucose is 220, BUN 25, creatinine 1.19.  He is doing  pretty well on that. Albumin still 1.7 at last check.   ASSESSMENT:  He is slowly improving.   PLAN:  To continue his current treatments and medications and follow.      Edward L. Juanetta Gosling, M.D.  Electronically Signed     ELH/MEDQ  D:  07/08/2007  T:  07/08/2007  Job:  [id number]

## 2010-10-11 NOTE — Consult Note (Signed)
NAME:  [name], [name] NO.:  [id number]   MEDICAL RECORD NO.:  [id number]          PATIENT TYPE:  OBV   LOCATION:  IC03                          FACILITY:  [facility]   PHYSICIAN:  Barbaraann Barthel, M.D. DATE OF BIRTH:  July 28, 1924   DATE OF CONSULTATION:  06/24/2007  DATE OF DISCHARGE:                                 CONSULTATION   REASON FOR VISIT:  Surgery was asked to see this 77 year old white male  with history of approximately 24 hours of nausea and vomiting.   CHIEF COMPLAINT:  That of nausea and vomiting and some abdominal  discomfort.  His wife is not the best historian, and he is hard of  hearing and not the best historian either.   HISTORY OF PRESENT ILLNESS:  Apparently, yesterday, this occurred after  church.  He continued to have further nausea and vomiting and he came to  the emergency room where he was admitted.  He was evaluated by the  medical service and the GI service.  A CT scan was performed that showed  a large incarcerated hernia.  Significant is the fact that this patient  has had three previous hernia repairs, two ventral hernias repaired, the  last with mesh, and this was according to his wife and daughter at least  15 years ago.  He has also had a previous groin hernia repair.  This  does not appear to be the problem by CT scan.  Clinically, there is no  recurrent groin hernia at least clinically.  The patient does have two  areas of incarcerated bowel near the supraumbilical and the  infraumbilical of the periumbilical area.  He has an NG tube placed  which shows some bilious drainage and his rectal examination is trace  guaiac positive.  Last colonoscopy was last year.   PHYSICAL EXAMINATION:  VITAL SIGNS:  The patient is approximately 5 feet  6, weighs, weighs approximately 200 pounds or 18.9 kg.  His last  temperature was 100.6 with a pulse rate 107, respirations 20, blood  pressure 141/80, O2 sat was 94%.  HEENT:  Head is  normocephalic.  EYES:  Extraocular movements intact.  Pupils were round and react to light and accommodation.  There is some  scleral injection.  Nose has an NG tube placed.  The mouth is moist.  NECK:  Jugular veins are flat.  There are no bruits auscultated.  No  thyromegaly, no tracheal deviation.  No jugular vein distention, no  cervical adenopathy.  CHEST:  Clear both anterior and posterior to auscultation.  HEART:  Tachy but regular.  ABDOMEN:  There is an obvious hernia in the ventral area that  corresponds to what is mentioned above with a CT scan.  No femoral or  inguinal hernias are appreciated.  RECTAL:  There is trace guaiac positive stool.  Prostate is smooth.  EXTREMITIES:  There is no clubbing or cyanosis or edema peripherally.   REVIEW OF SYSTEMS:  GI SYSTEM:  Nausea and vomiting for at least 24  hours.  Last colonoscopy in 2008.  No past history of hepatitis or  inflammatory bowel disease.  He has had problems with hemorrhoids in the  past.  CARDIORESPIRATORY SYSTEM:  The patient as has had a previous  angioplasty and apparently a stent placed.  He has been followed by the  [facility].  We will have them see this patient in the  morning and follow this the patient with [place].  His last ejection fraction  was 63% and he underwent a cardiac stress test in 2008 which was  satisfactory.  He has also history of hypertension and  hypercholesterolemia.  ENDOCRINE SYSTEM:  No history of diabetes,  however, his wife states that he may have been borderline diabetic.  However, he takes no hypoglycemics.  He has taken in the past some of  thyroid medicine, he takes Levothroid Synthroid 75 micrograms daily.  MUSCULOSKELETAL SYSTEM:  Arthritic complaints in hips and legs, mostly  appropriate for age.  GU SYSTEM:  The patient underwent a TURP in the  [facility] hospital several years ago, at least 10 years ago.   PAST SURGERIES:  1. Three hernia repairs, and that would be two  ventral hernia repairs      with mesh and one inguinal hernia repair.  2. History of a cholecystectomy in the past.  3. TURP.   MEDICATIONS:  1. Norvasc 5 mg daily.  2. Lotensin 20 mg daily.  3. Synthroid 75 mcg daily.  4. Metoprolol 50 mg daily.  5. Zofran since his administration here.  6. Protonix 40 mg daily.   ALLERGIES:  He has no known allergies.   LABORATORY DATA:  His white count was 3.8 when he was admitted in the  morning with an H&H of 15.2 and 45.0.  His electrolytes are grossly  within normal limits.  Sodium is 135, potassium 3.6, chloride 102, CO2  20, glucose 226, BUN is 21 and creatinine is 1.38.  His urinalysis shows  a specific gravity 1.030.  Obviously, when he was admitted, this patient  was dehydrated.  He has since that time been hydrated and is putting out  good urine at the present time, and we will continue to hydrate him  preoperatively.  Electrocardiogram showed sinus tachycardia with  nonspecific ST and T-wave changes.  CT scan as mentioned above.   IMPRESSION:  Therefore, [name] is an 77 year old gentleman who  presented with an incarcerated hernia with nausea and vomiting.  CT scan  shows likely obstructing process occurring, and we will plan for repair.  This will be difficult, I am sure, because of previous repairs and the  need for mesh.  Hopefully, he has not infarcted any bowel.  Clinically,  this does not appear to be the case.  We will continue to hydrate him  covering perioperatively with antibiotics and have the medical service  follow him as well.  I will have Dr. Alanda Amass see him in the morning to  follow-up with him from a cardiac point-of-view.  Postoperatively, he  will be in the intensive care unit, and arrangements are being made now.      Barbaraann Barthel, M.D.  Electronically Signed     WB/MEDQ  D:  06/24/2007  T:  06/25/2007  Job:  [id number]   cc:   Dr. Lilian Kapur   GI Service   [facility]

## 2010-10-11 NOTE — Group Therapy Note (Signed)
NAME:  [name], [name] NO.:  [id number]   MEDICAL RECORD NO.:  [id number]          PATIENT TYPE:  INP   LOCATION:  A203                          FACILITY:  [facility]   PHYSICIAN:  Osvaldo Shipper, MD     DATE OF BIRTH:  07-Aug-1924   DATE OF PROCEDURE:  07/02/2007  DATE OF DISCHARGE:                                 PROGRESS NOTE   SUBJECTIVE:  The patient says he is feeling a little bit better today.  His breathing is better.  He is coughing up yellowish expectoration at  that time, but otherwise he says that he feels okay.  His pain in his  abdomen is much better controlled.   OBJECTIVE:  VITAL SIGNS:  His heart rate on telemetry is in the 80s.  It  looks like it is sinus rhythm.  His temperature is 97.8, the blood  pressure is 127/66, respiratory rate is about 24, saturation 91%-97% on  2 liters per minute.  LUNGS:  Still reveal diffuse expiratory wheezing bilaterally.  A lot of  rhonchi are also present.  No definite crackles are present.  CARDIAC:  S1, S2 normal, regular.  No murmurs appreciated.  ABDOMEN:  Still distended, soft.  I do not appreciate any bowel sounds  today.  EXTREMITIES:  Show no edema.   LABORATORY DATA:  His BMET shows a glucose of 205, otherwise not very  remarkable.  His renal function is normal.  His prealbumin yesterday was  8.2.  There is no CBC today, as his hemoglobin has been stable, and his  white count has also been stable.  His ABG showed a pH of 7.47, PCO2 of  27, PO2 of 60.  He had a chest x-ray yesterday which showed evidence for  bilateral pulmonary infiltrates, increased in the left upper lobe since  previous exam.   ASSESSMENT AND PLAN:  1. Pulmonary.  Patient developed pneumonia while being in the      hospital.  He was intubated for a few days after he underwent      surgery for a strangulated hernia.  He is on vancomycin and Zosyn,      so it is a pretty broad-spectrum coverage.  He is afebrile.  For      his wheezing, we  went ahead and started him on steroids yesterday      and stopped his beta blocker.  Hoping the patient will continue to      improve.  I will send off sputum cultures just to make sure we are      not dealing with some unusual organism here, but I am hoping the      patient will improve from here on.  2. Status post surgery for strangulated hernia.  His NG tube was      removed yesterday.  Dr. Malvin Johns is following this patient closely.      I think he plans to initiate p.o. intake pretty soon, but we will      defer to him on this matter.  3. Hyperglycemia.  The patient was started on TPN, and  his blood sugar      started going up.  We started him on some Lantus yesterday,      especially because he was also started on steroids.  So, this will      also need to be followed.  4. History of coronary artery disease and hypertension.  Stable.  5. He also has hyperthyroidism, for which we will restart his      Synthroid today.  6. Nutrition.  He is on TPN through a PICC line.  When his p.o. intake      is consistent, the TPN can be discontinued.  7. Physical therapy has been following him, and it looks like the      patient may be able to go home when he is finally ready for      discharge.  8. He was started on IV Cardizem by Dr. Domingo Sep yesterday for      tachycardia.  He does not have a history of atrial fibrillation,      but he just has sinus tachycardia.  Anyway, cardiology has been      following him for his history of coronary artery disease.  EKGs      that we have on record show sinus rhythm.  So, I think when his      heart rate has been stable, his Cardizem can be discontinued, and      maybe he can be started on some p.o. Cardizem.      Osvaldo Shipper, MD  Electronically Signed     GK/MEDQ  D:  07/02/2007  T:  07/02/2007  Job:  [id number]

## 2010-10-11 NOTE — Consult Note (Signed)
NAME:  [name], SERMON NO.:  [id number]   MEDICAL RECORD NO.:  [id number]          PATIENT TYPE:  INP   LOCATION:  A329                          FACILITY:  [facility]   PHYSICIAN:  Barbaraann Barthel, M.D. DATE OF BIRTH:  [birth year]   DATE OF CONSULTATION:  07/12/2007  DATE OF DISCHARGE:                                 CONSULTATION   Surgery was asked to see this 77 year old white male who is status post  exploratory laparotomy for bowel obstruction on June 24, 2007 at  which time he had  small-bowel resection and repair of a recurrent giant  incisional hernia with MTF graft. The patient came to the emergency room  after being discharged approximately 24 or 48 hours previously were he  was discharged to a nursing home. The nursing home sent him back to the  emergency room because he was distended and they felt uncomfortable with  this situation. He was seen in the emergency room by the emergency room  physician and he will be further evaluated by the [facility] team to see  if he will be readmitted.  In essence, clinically he is an 77 year old,  white male who is status post exploratory laparotomy on June 24, 2007. His is afebrile.  His  pulse rate is 66 per minute, respirations  16, blood pressure 119/65, O2 sat is 98-99%.  In essence, his wound is  clean.  There is some serous drainage but it is not copious and the  patient has global edema likely from congestive heart failure and his  massive third spacing of fluids, but his wound is clean, bowel sounds  are present. His wound was redressed, his belly is soft and bowel sounds  are present.  He had a bowel movement in the emergency room.  The  abdominal series showed no obstructive pattern or any free air.  He  looks essentially comfortable as can be expected under these conditions  and more or less surgically no change from the time when he was  discharge.  He does have some considerable peripheral global edema  in  his lower extremities and his upper extremities as well as has scrotal  and penile edema.  I do not know whether or not the [facility] team will  admit him and deal with his fluid status further. Other diagnoses  include resolving pneumonia, likely diabetes, hypertension and coronary  artery disease and a history of GERD..   As stated, clinically he is stable from the surgical point of view and I  will obviously follow this patient along with the [facility] team while  he is in the hospital his wound care will consist of cleaning his wound  with alcohol. I will not take his clips out at this time and dress his  wound with Xeroform and a sterile dressing on a daily basis.  I will  follow him along with the [facility] team.      Barbaraann Barthel, M.D.  Electronically Signed     WB/MEDQ  D:  07/12/2007  T:  07/12/2007  Job:  [id number]

## 2010-10-11 NOTE — Group Therapy Note (Signed)
NAME:  [name], [name] NO.:  [id number]   MEDICAL RECORD NO.:  [id number]          PATIENT TYPE:  INP   LOCATION:  A203                          FACILITY:  [facility]   PHYSICIAN:  Edward L. Juanetta Gosling, M.D.DATE OF BIRTH:  1924/11/19   DATE OF PROCEDURE:  DATE OF DISCHARGE:                                 PROGRESS NOTE   [name] seems to be doing much better.  He has coughed up a lot of  sputum yesterday.  He has no new complaints.   PHYSICAL EXAMINATION:  VITALS:  Exam today shows temperature is 97.4,  pulse 64, respirations 16, blood pressure 118/69.  His blood sugar is  245, but he seems much improved.   ASSESSMENT:  He is overall much better.  He has had what looks like  probably a pneumonia postop but he is markedly improved.  He had surgery  for an incarcerated ventral hernia and he is doing well from that. He is  eating now. He is diabetic and his blood sugars are fairly well-  controlled.  He is on sliding scale but overall I am very pleased with  how he is doing.  He is much better than about 48 hours ago.      Edward L. Juanetta Gosling, M.D.  Electronically Signed     ELH/MEDQ  D:  07/06/2007  T:  07/08/2007  Job:  [id number]

## 2010-10-11 NOTE — Procedures (Signed)
NAME:  [name], [name] NO.:  [id number]   MEDICAL RECORD NO.:  [id number]          PATIENT TYPE:  INP   LOCATION:  A329                          FACILITY:  [facility]   PHYSICIAN:  Dani Gobble, MD       DATE OF BIRTH:  08/05/1924   DATE OF PROCEDURE:  DATE OF DISCHARGE:                                ECHOCARDIOGRAM   INDICATIONS:  An 77 year old gentleman who is status post bowel  resection, admitted with pneumonia and concern for CHF.   The technical quality of the study is reasonable.   Aorta measures a bit dilated; however, subjectively it appears to be  normal in size.   The left atrium appears to be normal in size.  The patient appeared to  be in sinus rhythm during the procedure.   The interventricular septum and posterior wall appear mild to moderately  thickened.   The aortic valve leaflets were not well visualized, but grossly the  opening appeared normal.  Mild aortic insufficiency was noted.  Doppler  interrogation of the aortic valve revealed mildly elevated velocities  suggestive of mild aortic sclerosis.   The mitral valve was not well visualized but appeared to be grossly  structurally normal.  Trace to mild mitral regurgitation was noted.  Doppler interrogation of the mitral valve was within normal limits.   The pulmonic valve was poorly visualized but mild pulmonic insufficiency  was noted.   The tricuspid valve appeared grossly structurally normal with mild  tricuspid regurgitation noted.   The left ventricle was normal size but the LV IDD measured 4.0 cm, the  LV ICD measured 3.0 cm.  Overall left systolic function appeared to be  normal to vigorous.  No regional wall motion abnormalities were noted.   The right atrium and right ventricle and were normal in size and right  ventricular systolic function was vigorous to hyperdynamic.   I cannot exclude the possibility of a small pericardial effusion without  evidence of hemodynamic  compromise.   IMPRESSION:  1. Mild to moderate concentric left ventricular hypertrophy.  2. Mild aortic sclerosis without stenosis.  3. Mild aortic insufficiency.  4. Trace to mild mitral regurgitation.  5. Mild tricuspid regurgitation.  6. Mild pulmonic insufficiency.  7. Normal left ventricular size with normal to vigorous left      ventricular systolic function and no regional wall motion      abnormalities noted.  8. Normal right ventricular size with mild to moderate right      ventricular hypertrophy and vigorous to hyperdynamic right      ventricular systolic function.  9. Small pericardial effusion without evidence of hemodynamic      compromise.           ______________________________  Dani Gobble, MD     AB/MEDQ  D:  07/15/2007  T:  07/16/2007  Job:  [id number]   cc:   Tresa Moore

## 2010-10-11 NOTE — Group Therapy Note (Signed)
NAME:  [name], [name] NO.:  [id number]   MEDICAL RECORD NO.:  [id number]          PATIENT TYPE:  OBV   LOCATION:  IC03                          FACILITY:  [facility]   PHYSICIAN:  Edward L. Juanetta Gosling, M.D.DATE OF BIRTH:  1924-09-03   DATE OF PROCEDURE:  DATE OF DISCHARGE:                                 PROGRESS NOTE   Patient [facility] Hospitalist Team.  Dr. Malvin Johns the surgeon and that  Dr. Domingo Sep, the cardiologists involved.  Mr. Decarlo has had problems  through the night with dropping his O2 sat without any obvious cause.  He actually sounds pretty clear.  He is now on 70% O2 and 8 of PEEP.  No  other new problems been noted.   PHYSICAL EXAMINATION:  Shows he is sedated.  His pulse is in the 80s, blood pressure about 120 systolic.  His chest actually pretty clear.  I did not examine his abdomen.  His heart is regular.   His lab work today, blood gas on initially on 80% 550, rate of 12, 8 of  PEEP showed pO2 of 171 so now is on 70%.  His pCO2 was 33, pH 7.41.  Want to see if we can continue to taper his FIO2.  Potassium was 3.6,  BUN 23, creatinine 1.25.  CBC shows white count 4800, hemoglobin is 8.9,  platelets 128,000.  All drifting downward.  Blood cultures on the 26th  are negative.  Chest x-ray today shows some fluid in the minor fissure  and he has perhaps some volume overload.  I have discussed this with Dr.  Rito Ehrlich and with Dr. Domingo Sep.   Our plan is, because he has a cardiac disease, to go ahead and give him  some blood.  Try to give him some Lasix in between units.  Continue his  treatments.  I am going to see if I can decrease his FIO2 as the day  goes on.  No other new treatments right now.      Edward L. Juanetta Gosling, M.D.  Electronically Signed     ELH/MEDQ  D:  06/27/2007  T:  06/27/2007  Job:  [id number]

## 2010-10-11 NOTE — Group Therapy Note (Signed)
NAME:  [name], [name] NO.:  [id number]   MEDICAL RECORD NO.:  [id number]          PATIENT TYPE:  INP   LOCATION:  A203                          FACILITY:  [facility]   PHYSICIAN:  Dorris Singh, DO    DATE OF BIRTH:  Jan 03, 1925   DATE OF PROCEDURE:  DATE OF DISCHARGE:                                 PROGRESS NOTE   The patient seen today sitting up in chair eating.  He looks much more  improved.  His diet has been changed to the recommendations from speech  therapy.  The patient seen with Dr. Malvin Johns.  He has had a bowel  movement, has no complaints.  Also states that his breathing is better.  He is off the Ventimask and clinically looks much more improved.   VITAL SIGNS:  Temperature 97.4, pulse 64, respirations 18, blood  pressure 118/69.  GENERAL:  This is an 77 year old male who is well-developed, well-  nourished, in no acute distress.  HEART:  Regular rate and rhythm.  LUNGS:  Decreased, generalized rhonchi throughout, with some  improvement.  ABDOMEN:  Distended but soft with appropriate tenderness after surgery.  Bowel sounds heard in all four quadrants.  EXTREMITIES:  Positive pulses.  No edema, ecchymosis or cyanosis.   ASSESSMENT AND PLAN:  1. Pneumonia.  The patient seems clinically more improved and he is      off the Ventimask and his white count is completely normal.  Will      continue with antibiotic therapy for a few more days and continue      to monitor him.  Also, the plan will be to try to wean him off of      the [place].  2. Status post strangulation hernia repair.  The patient is improving,      he is eating, he has had a bowel movement.  Will continue to      monitor him.  3. Hyperglycemia.  I placed the patient on a sliding scale and ordered      a hemoglobin A1c which is 7.2.  Will talk with the patient      regarding this and talk about starting him on some oral      hyperglycemics upon discharge and he can follow up with his primary     care physician, who can then make some decisions as to what they      would like to place him on.      Dorris Singh, DO  Electronically Signed     CB/MEDQ  D:  07/06/2007  T:  07/06/2007  Job:  [id number]

## 2010-10-11 NOTE — Consult Note (Signed)
NAME:  [name], KONDRACKI NO.:  [id number]   MEDICAL RECORD NO.:  [id number]          PATIENT TYPE:  OBV   LOCATION:  A326                          FACILITY:  [facility]   PHYSICIAN:  R. Roetta Sessions, M.D. DATE OF BIRTH:  10-17-1924   DATE OF CONSULTATION:  DATE OF DISCHARGE:                                 CONSULTATION   REFERRING PHYSICIAN:  [facility]   REASON FOR CONSULTATION:  Nausea, vomiting, abdominal pain.   Mr. Weekley is an 77 year old male.  He had acute onset nausea,  vomiting, and diarrhea, which began on 06/22/07.  He had multiple  episodes of diarrhea and emesis.  He was found to have bright red blood  in his stool.  Since admitted he began to have persistent nausea and  vomiting, that was not controlled with antiemetics.  He has since had an  NG tube placed.  He has had a history of colonoscopy by Dr. Leone Payor last  year.  However, we do not have this report.  He has had some Phenergan.  He appears drowsy.  He is very difficult to obtain a history.  He is  also hard of hearing.   Last CT was April 18, 2006.  He was found to have a moderately large  ventral hernia, bilateral inguinal hernias and fatty liver.   PAST MEDICAL AND SURGICAL HISTORY:  Hypertension, hemorrhoids, coronary  artery disease status post MI, and hard of hearing, herniorrhaphies. He  has had history of colonoscopy by Dr. Leone Payor last year.  However, do  not have this report.  Aortic valve sclerosis and insufficiency.  Hyperlipidemia and hypothyroidism.   MEDICATIONS PRIOR TO ADMISSION:  1. Amlodipine 5/25 mg daily.  2. Aspirin 81 mg daily.  3. Metoprolol 50 mg ER once daily.  4. Furosemide 40 to 80 mg daily.  5. Klor-Con M20 once daily.  6. Simvastatin 20 mg daily.  7. Omeprazole 20 mg nightly.  8. Levothyroxine 75 mcg daily.   ALLERGIES:  NO KNOWN DRUG ALLERGIES.   FAMILY HISTORY:  Unable to obtain from patient, but previous medical  records state no known family  history of carcinoma, liver, or chronic GI  problems.   SOCIAL HISTORY:  He lives with his wife.  There is no known history of  alcohol, tobacco, or drug use.   REVIEW OF SYSTEMS:  See HPI.  Unable to obtain from patient at this  time.   PHYSICAL EXAMINATION:  VITAL SIGNS:  Weight 88.9 kilograms.  Height 65  inches.  Temperature 95.  Pulse 120.  Respirations 20.  Blood pressure  126/77.  O2 sat 94% on room air.  GENERAL:  Mr. Pooley is an elderly Caucasian male who appears acutely  ill.  He is alert and oriented x3.  SKIN:  Pink, with diaphoresis.  Sclerae with injections.  Oropharynx  pink and moist without any lesions.  He has an NG tube intact with  bilious material in canister.  NECK:  Supple without mass or thyromegaly.  CHEST:  Heart rate is tachycardic.  Murmur noted.  LUNGS:  Clear to auscultation bilaterally.  ABDOMEN:  Absent bowel sounds.  He has a large, protruding ventral  hernia to his left lower abdomen, it is warm to the touch.  It is  nonreducible and tender.  Unable to palpate hepatosplenomegaly.  EXTREMITIES:  Trace lower extremity edema bilaterally.   LABORATORY STUDIES:  WBC is 3.8.  Hemoglobin 15.2.  Hematocrit 45.  Platelets 185.  Calcium 8.9.  Sodium 135, potassium 3.6, chloride 102,  CO2 20, BUN 20, creatinine 1.38, and glucose 226.  Urine has a high  specific gravity, glucose, and trace blood.   IMPRESSION:  Mr. Lefferts is an 77 year old male with acute onset of  nausea, vomiting, abdominal pain and weakness, now with tachycardia and  acute abdominal pain.  He has a large, warm, ventral hernia not easily  reducible and no bowel sounds with NG tube in place.  High likelihood of  bowel obstruction secondary to hernia strangulation.  At this point we  need to rule out an acute abdomen.   PLAN:  1. CT of the abdomen and pelvis as soon as possible.  2. Pain control p.r.n.  3. Agree with NG tube for now.  4. Obtain colonoscopy report and the biopsy  report from 2008.   Dr. Leone Payor, thank you and [facility] for allowing [place] to  participate in the care of Mr. Schippers.      Lorenza Burton, N.P.      Jonathon Bellows, M.D.  Electronically Signed    KJ/MEDQ  D:  06/24/2007  T:  06/24/2007  Job:  [id number]

## 2010-10-11 NOTE — Group Therapy Note (Signed)
NAME:  KACI, FREEL NO.:  [id number]   MEDICAL RECORD NO.:  [id number]          PATIENT TYPE:  INP   LOCATION:  IC03                          FACILITY:  [facility]   PHYSICIAN:  Edward L. Juanetta Gosling, M.D.DATE OF BIRTH:  Mar 09, 1925   DATE OF PROCEDURE:  06/30/2007  DATE OF DISCHARGE:                                 PROGRESS NOTE   Mr. Maggart continues doing well off the ventilator.  He is now on  nasal cannula at 3.5 liters and seems to be doing quite well.   His physical examination shows he is awake. His pulse is in the 60s,  blood pressure in the 110-120 range.  His chest is clear with occasional  rhonchi.  His heart is regular.   Lab work shows white count 8600, hemoglobin 11.1, platelets 189.  Metabolic profile shows potassium of 2.9. Glucose is 221, BUN 21,  creatinine 1.18, and albumin was 1.6. Magnesium 2.4, phosphorus 3.1.   ASSESSMENT:  He is doing well.   PLAN:  Continue his current medications and treatments and follow.  Overall from a pulmonary point of view,  he is doing quite well.      Edward L. Juanetta Gosling, M.D.  Electronically Signed     ELH/MEDQ  D:  06/30/2007  T:  06/30/2007  Job:  [id number]

## 2010-10-11 NOTE — Group Therapy Note (Signed)
NAME:  [name], [name] NO.:  [id number]   MEDICAL RECORD NO.:  [id number]          PATIENT TYPE:  INP   LOCATION:  A203                          FACILITY:  [facility]   PHYSICIAN:  Dorris Singh, DO    DATE OF BIRTH:  [birth year]   DATE OF PROCEDURE:  07/04/2007  DATE OF DISCHARGE:                                 PROGRESS NOTE   The patient is seen today with some difficulty breathing. States that he  does feel a little bit better.  However, clinically his respiration rate  is still elevated. Went ahead and ordered a chest x-ray on him today  after discussion with Dr. Micheline Maze regarding his chest x-ray on the 4th. He  did not believe that he had a collapsed lung. His current chest x-ray  shows diffuse bilateral pulmonary infiltrates with improvement in  aeration in left upper lobe since previous study, bilateral bibasilar  effusions and atelectasis was slightly improved aeration of the left  lower lobe. So, this study does show some improvement. It is also seen  by pharmacology who is monitoring his vancomycin and will continue with  that and they also repleted his magnesium as well.   Current vitals are 97.5, pulse 73, respirations 22, blood pressure  132/61.  He is currently 35% Ventimask at 93%.  GENERAL: This is an 77 year old Caucasian male who is well-developed,  well-nourished and is hard of hearing.  HEART: Regular rate and rhythm.  LUNGS:  He does have some rhonchi present. Still left greater than  right.  ABDOMEN: Distended, but soft with appropriate tenderness after surgery.  EXTREMITIES: Positive pulses. No ecchymosis or edema. He has SCDs on.   His current white count today is 12.4, which is increased from  yesterday, which was 10.9.  Hemoglobin is 10.0, hematocrit is 28.4,  platelet count is 281. His chemistries: Sodium 141, potassium 4.0,  chloride 113, CO2 21, glucose 119. BUN 30, creatinine 1.4. His magnesium  is 2.6, phosphorus 3.0, calcium 8.0.   His sputum culture noncontributory at this point in time.   ASSESSMENT/PLAN:  1. Increased shortness of breath. This is due to his pneumonia which      seems to be improving. He was placed back on Zosyn and vancomycin      and will continue to monitor that.  2. Status post strangulated hernia repair. He seems to be improving      with that. Dr. Malvin Johns is continuing to follow the case.  3. Hyperglycemia. The patient is still on TPN and will go ahead and      discontinue that as long as he is continuing to eat and tolerate      clear liquids without any problems.  4. Hypertension. Continuing to follow that which remains stable at      this point in time.  5. Difficulty breathing. The patient is still on a Ventimask. Will see      if we can wean him down. Once he is more comfortable, we will try      to get him back to nasal cannula.  6. Hypothyroidism.  He will continue on Synthroid.  7. Physical therapy will continue as well.  8. The patient was started on IV Cardizem for tachycardia, but      Cardiology is following him. Will see how he does tomorrow and then      switch him to p.o. medications as long as he remains stable.      Dorris Singh, DO  Electronically Signed     CB/MEDQ  D:  07/04/2007  T:  07/04/2007  Job:  [id number]

## 2010-10-11 NOTE — Group Therapy Note (Signed)
NAME:  [name], [name] NO.:  [id number]   MEDICAL RECORD NO.:  [id number]          PATIENT TYPE:  OBV   LOCATION:  IC03                          FACILITY:  [facility]   PHYSICIAN:  Skeet Latch, DO    DATE OF BIRTH:  26-Jun-1924   DATE OF PROCEDURE:  DATE OF DISCHARGE:                                 PROGRESS NOTE   SUBJECTIVE:  [name] is an 77 year old Caucasian male, presented to  [facility] ER, after a one-day history of nausea, vomiting, diarrhea.  The patient presented with some bilious type of emesis and complained he  was unable to eat and had weakness.  His wife brought him to the ER to  be evaluation.  The patient does have a history of hemorrhoids and had a  colonoscopy at [facility] early this year.  The patient has a history of  a longstanding hernia with multiple hernia repairs in the past.  The  patient was admitted for his nausea and vomiting, diarrhea and  dehydration, and secondary to his bilious emesis, the patient was placed  on a NG tube.  NG tube did improve his nausea and vomiting.  Secondary  to the size of the hernia, a CT scan was ordered, and GI was consulted.  CT showed a ventral hernia containing a non-obstructive small bowel loop  in the mid-abdomen, showed a small bowel herniation through a defect and  left of the midline upper pelvis causing a small bowel obstruction and  mesenteric edema.  Radiologist read it as a strangulated hernia, and  surgery was consulted.  Surgery saw patient and felt that patient needed  immediate bowel resection and hernia repair.  Today, the patient is  status post bowel resection and hernia repair and is currently intubated  in the ICU.  At this time, the patient's blood pressure seems to be in  the low 80s.  I feel at this time the patient needs some type of pressor  for his blood pressure, and I will also start the patient on vancomycin.  If this does not improve, the patient probably will need to be on  a  sepsis protocol and will need a central line placed.   OBJECTIVE:  CURRENT VITAL SIGNS:  Blood pressure is 87/40, heart rate is  109, the patient is satting 97%.  CARDIOVASCULAR:  He is tachycardic.  S1 and S2 is regular.  LUNGS:  Slight course breath sounds heard bilaterally.  No rhonchi or  wheezing.  ABDOMEN:  Obese and soft on deep palpation.  Minimum bowel sounds are  appreciated.  EXTREMITIES:  No clubbing, cyanosis or edema.   LABORATORY:  ABG.  The patient is intubated, pH is 7.459, PCO2 is 31.2,  PO2 73.2, bicarb 21.8.  His PTT is 20.3, INR 1.7, white count is 3.5,  hemoglobin 12.7, hematocrit 36.5, platelets 158, sodium 134, potassium  3.5, chloride 107, CO2 22, glucose 135, BUN 27, creatinine 1.56,   ASSESSMENT/PLAN:  1. The patient is status post hernia repair and a bowel resection.      The patient is being followed by  surgery, and post-surgical orders      were written.  The patient was on Cipro.  As stated, I would add      vancomycin for an antibiotic.  Also, cardiology was consulted to      the patient's cardiac history.  The patient is currently NPO and      has an NG tube in place.  2. Hypotension.  Will start Neo-Synephrine drip, titrate his mask to      be over 60.  As stated, if this does not work for his hypotension,      the patient may need to be on a sepsis protocol and may need other      pressors added, possibly dopamine.  We will continue to follow      patient closely.      Skeet Latch, DO  Electronically Signed     SM/MEDQ  D:  06/25/2007  T:  06/25/2007  Job:  [id number]

## 2010-10-11 NOTE — Group Therapy Note (Signed)
NAME:  NICHOLS, CORTER NO.:  [id number]   MEDICAL RECORD NO.:  [id number]          PATIENT TYPE:  OBV   LOCATION:  IC03                          FACILITY:  [facility]   PHYSICIAN:  Edward L. Juanetta Gosling, M.D.DATE OF BIRTH:  02-01-1925   DATE OF PROCEDURE:  DATE OF DISCHARGE:                                 PROGRESS NOTE   SUBJECTIVE:  Mr. Heigl seems much better this morning.  His heart  rate is down his temperature is down, blood pressure is up.  He looks  very comfortable.  His no new problems been noted through the night.   PHYSICAL EXAMINATION:  This morning shows his heart rates in the 90s,  blood pressure about 114/70.  His chest much clearer.  Heart is regular with an occasional extra systole.  I did not examine his abdomen.   His lab work, BNP is less than 30.  CBC shows white count 5400,  hemoglobin is 10.4, platelets 131,000.  BMET BUN 32, creatinine 1.47.  His BNP was less than 30.  His blood gas on 55% O2 tidal volume 550 rate  of 12, PEEP of 5, pH 72, pO2 of 72, pCO2 of 33 pH 7.42.  The chest x-ray  shows cardiomegaly.  Still atelectasis but less.   ASSESSMENT:  He is on 55% O2 which may keep [place] from being able to get  him ready for extubation, but I am going to see if we can get his O2  down some, continue his other treatments, and hopefully have him ready  for weaning and possible extubation later today.      Edward L. Juanetta Gosling, M.D.  Electronically Signed     ELH/MEDQ  D:  06/26/2007  T:  06/26/2007  Job:  [id number]

## 2010-10-11 NOTE — Discharge Summary (Signed)
NAME:  [name], [name] NO.:  [id number]   MEDICAL RECORD NO.:  [id number]          PATIENT TYPE:  INP   LOCATION:  A203                          FACILITY:  [facility]   PHYSICIAN:  Dorris Singh, DO    DATE OF BIRTH:  02-Oct-1924   DATE OF ADMISSION:  06/23/2007  DATE OF DISCHARGE:  02/11/2009LH                               DISCHARGE SUMMARY   ADMISSION DIAGNOSIS:  1. Nausea, vomiting and diarrhea, etiology unknown.  2. Dehydration.  3. Bright red blood per rectum times several episodes.  4. Hypokalemia.  5. Hypertension.  6. Hyponatremia.   DISCHARGE DIAGNOSES INCLUDE:  1. Pneumonia, which is resolving.  2. Status post hernia repair.  3. Dysphagia.  4. Hyperglycemia.  5. Hypertension.  6. Hemorrhoids.  7. History of myocardial infarction.  8. Gastroesophageal reflux disease.   PRIMARY CARE PHYSICIAN:  They have him listed as Dr. Molly Maduro Day;  however, he said he recently has transferred to Dr. Jacqulyn Bath.   CONSULTATIONS:  He had several consults, which include physical therapy.  Dr. Juanetta Gosling of pulmonology.  Dr. Malvin Johns of surgery.  Dr. Jena Gauss of gastroenterology.   He had several tests done.  Radiology evaluations, starting on June 24, 2007; he had a CT of the abdomen and pelvis.  CT of the abdomen  showed minimal bibasilar atelectasis, tiny ventral hernia, containing  nonobstructive small bowel loop in the abdomen, supraumbilical, see  below.  CT of the pelvis showed small-bowel herniation through fascial  defect, left of midline and upper pelvis, causing small bowel  obstruction and mesenteric edema, no evidence of bowel perforation.  Question left inguinal hernia.  On June 24, 2007, he also had a chest  x-ray, which showed no acute cardiopulmonary disease, enteric tube tip  in stomach, proximal side port not seen.   On January 27, he had a portable chest x-ray, one view, which showed  cardiomegaly with low lung volumes and bibasilar atelectasis,  satisfactory endotracheal tube placement.  On June 26, 2007, he had  another portable chest x-ray, one view, that demonstrated right tip of  arm PICC line projects over right atrium, though this may be accentuated  by severely decreased lung volumes, at current degree of pulmonary  infiltration, withdrawing tube 4 cm would place it at the caval-atrial  junction region.  Satisfactory endotracheal tube position.  Bibasilar  atelectasis.  Right pleural effusion and question perihilar edema and  infiltrate.  On January 28, another portable chest x-ray, which  demonstrated cardiomegaly and slight improvement in bibasilar air space  disease.  January 29, another one-view portable chest x-ray showed  slight improvement in aeration of both lung bases, residual small right  pleural effusion.  June 27, 2007, one-view portable chest x-ray  demonstrated increased perihilar infiltrate edema with mild right  basilar atelectasis and probable right pleural effusion.  July 01, 2007, portable chest x-ray demonstrates bilateral pulmonary infiltrates  increasing in left upper lobe since previous exam.  June 29, 2007,  portable chest x-ray shows slightly lower lung volumes after extubation  of change.  June 30, 2007:  Slightly worsening  air space disease  versus asymmetric edema, small right pleural effusion; this is a  portable chest x-ray.  February 4, portable chest x-ray demonstrates  degraded by respiratory motion artifact, increased upper lobe pneumonia,  versus aspiration; left lower lobe collapse stable to increased.  Two-  view chest on July 04, 2007, which demonstrates diffuse bilateral  pulmonary infiltrates with improvement in aeration in left upper lobe  since previous study.  Bibasilar effusions and atelectasis with slightly  improved aeration of left lower lobe.  One-view portable chest x-ray  which demonstrates, on February 8, there is a right basilar atelectasis  or  infiltrate, stable right subclavian central venous line position,  left basilar atelectasis, and on February 9, he had a modified barium  swallow, which showed swallowing study function was performed.  See  separate sheet and pathology report.   His H&P was done by Dr. Dorris Singh, but to summarize, patient is an  77 year old Caucasian male, who presented to [facility]  after one-day history of nausea, vomiting and diarrhea.  He had had  multiple episodes of diarrhea and emesis, which was characterized as  bilious.  He was unable to eat and drink.  He also had multiple episodes  of bright red blood per rectum with liquid stools multiple times on that  first day.  He was brought by his wife for evaluation.  He had just  recently had a colonoscopy by [facility] within the last 12 months and  had a past medical history significant for hypertension, hemorrhoids and  a ventral hernia, as well as history of an MI.  Patient was then  admitted to the service of [facility] with the above diagnoses, at first  to 23-hour observation.  Also he was placed on remote telemetry and  hydrated with normal saline IV fluids.  Serial hemoglobins and  hematocrits were followed and a GI consult was obtained for the morning  regarding bright red blood per rectum and hemorrhoids.  Also, due to the  potential of a GI bleed, he was placed on TED hose and put on Protonix  IV.  He was given Phenergan and Lomotil and stool cultures were  obtained.   After he was admitted, he was examined the next day.  He was found in  bilious vomit.  At that point in time a CT of the abdomen was ordered,  which demonstrated a possible obstruction of his colon.  A NG tube was  placed and GI consult saw him.  A CT of the pelvis was done.  Pain  control was completed.  The NG tube was then placed and GI decided to  obtain colonoscopy records.  Also LFTs were obtained, as well.  Once the  results from the CT were  obtained, Dr. Malvin Johns of general surgery was  contacted regarding the patient's history of three hernia repairs, one  groin and two ventral.  At that point in time it was decided that he had  a ventral incisional hernia with incarceration.  It was determined that  he would have ventral hernia repair and small bowel resection, if  needed.  He was taken to surgery on January 26 with the diagnosis of  small bowel obstruction, secondary to incarcerated ventral recurrent  incisional hernia.  He had exploratory laparotomy with small bowel  resection and repair of hernia with a 6 x 6 MTF graft.  Patient was then  placed in the ICU.  He was intubated and followed along  by [facility]-Link.  Dr.  Juanetta Gosling was then consulted on the case to follow him to help with  ventilator management.  Also, patient was placed on vancomycin with  pharmacy to follow.   On January 27, patient continued to be on the ventilator.  He was also  started on Cipro, as well and, due to his severe hypotension, he was  started on Neo-Synephrine drip and it was titrated to over 60 and the  sepsis protocol was initiated on him.  Dr. Domingo Sep saw the patient and  continued to follow him, held all of his medications and awaited patient  to be weaned off the ventilator.  He was followed by E-Link, as well,  and by Dr. Juanetta Gosling.  On January 28, the patient was still intubated and  he was then extubated, seemed to be doing well.  We continued to follow  him.  Dr. Juanetta Gosling put him on 55% Ventimask.  Patient had an episode  where, when he was deep suctioned, they obtained thick, tan secretions  from his endo tube.  He was then bagged and lavaged several times,  followed by an ABG.  There were no plugs present.  His PEEP was  increased to 8 cm per Dr. __________  and FiO2 increased to 100%, his O2  saturation was 92%.  Also on January 28, patient had intermittent  episodes of hypoxia, but his PEEP was increased and that helped him.  A  PICC line  was placed on January 28, as well, followed by an x-ray.  Cardiology continued to follow patient on January 29.  [facility]  continued to have patient on the vent on January 29.  He seemed to be  doing well from a surgical standpoint and he was on antibiotic therapy  as appropriate.  Nutrition came to see him on January 29, which  recommended him to have TNA for his 100% nutritional needs until he was  taken off the vent and he was started on that.  Pharmacy gave TPN in  vent protocol, started him on 50 mL per hour.  Also it was determined  that the patient's hemoglobin was 8.9 and it was drifting downward and  blood cultures from January 26 were negative.  Dr. Juanetta Gosling then decided  that we would transfuse him and to see if this would help.   On January 30, patient was still intubated.  However, he was more awake  and aware and comfortable.  He was on TPN.  Chest x-ray was reviewed,  which showed increasing pulmonary infiltrates and the plan would be to  wean him today and to continue with antibiotic lavage.  He was seen by  [facility], who recommended that we check his TSH level and  that was checked.  Pharmacy continued to follow him with his TPN.  Surgery followed and cleaned wound as instructed.  Nutrition gave more  recommendations regarding placing patient on a sliding scale.  Dr.  Juanetta Gosling, on January 30, was hopeful we could extubated patient and he  was extubated on January 31.  He was still on TPN and antibiotic  therapy, continued to progress.  He was breathing well.  He was on 2 L  of O2, continued to progress without any problems.  On February 1,  patient was found to be coughing and wheezing.  They checked a chest x-  ray, found an increasing right and left pulmonary infiltrate.  He was  extubated two days ago.  He was also hypokalemic.  He was currently on  antibiotic therapy.  We continued to monitor him.  On February 2,  patient had a rough night with  increasing episodes of shortness of  breath.  It was felt he probably had bilateral pneumonia.  He was placed  on O2 nebs and pulmonary toilet, due to being extubated two days ago and  being status post bowel resection for strangulated hernia.  He continued  on nebulizer treatments and was started on steroids.  Patient had  episodes of shortness of breath.  [facility], due to the  patient's still being n.p.o., went ahead and started him on IV Cardizem.  Physical therapy continued to see patient, as well, while he was in the  ICU.   On February 3, Dr. Rito Ehrlich signed off patient with assessment and plan  of this patient developed pneumonia while being in the hospital.  He was  extubated three days prior to.  He was started on vancomycin and Zosyn.  He remained afebrile, but was wheezing and abdominally breathing.  Sputum cultures were obtained.  He was also status post surgery for  strangulated hernia.  His NG tube was removed on the second.  Dr.  Malvin Johns was following him closely and taking care of wound care.  Hyperglycemia.  Because he was started on TPN, his blood sugars  continued to go up so he was then started on Lantus, as well as  steroids, which is probably increasing __________.  He has a history of  hypothyroidism and his Synthroid was then restarted.  He was getting his  nutrition via TPN through a PICC line and, once he increased his p.o.  intake, then he could be discontinued off the TPN.  Physical therapy  continued to follow him to make him ready for discharge.   Dr. Domingo Sep of [facility] started him on IV Cardizem on  February 2 for tachycardia and he does have a history of atrial  fibrillation.  She would continue to follow him until discharge to  change as definitive p.o. intake improved.  On February 3, it was  confirmed by chest x-ray that he had bilateral pneumonia.  Patient then  was transferred out of the ICU, as he continued to  improve clinically.  However, his x-rays did demonstrate that he did have pneumonia and he  did have increasing shortness of breath.  This was determined on  February 4.  We continued to monitor his CBC, as well as his blood  sugars, and continued him on steroids and Zosyn and vancomycin and  increased his breathing treatments to around the clock and every 2 hours  as needed.  Dr. Juanetta Gosling continued to follow with the patient and surgery  continued to follow with the patient with wound care.  There was some  concern after extubation.  The family was saying that patient had been  coughing after eating.  At this point in time, it was determined that we  would get a bedside swallow study.  Bedside swallow study was done and,  based on her recommendations, the speech therapist recommended a  modified barium swallow study, which was completed.  Her recommendations  were as follows.  To have patient sit upright with no straws, small  bites, have complete supervision when eating, eat only when most alert,  and if he starts to cough, he needs to stop eating and do the chin-tuck,  which was taught to him, and intermittent throat clearing.  It has been  recommended that  he have thin liquids and mechanically soft food.  He  was started on this diet and has not had any problems throughout his  course and stay with coughing or choking while eating.  Nutrition  continued to follow him as he continued his p.o. intake.  The TPN was  then decreased and stopped, as well as the sliding glycemic protocol  sliding scale that was stopped, as well.   Patient remained on a Ventimask up until February 2, worked with  respiratory to try to get patient onto nasal cannula.  He was then able  to get to nasal cannula on postoperative day #12 and was breathing  better.  Slowly, he continued to improve from February 7 to February 10.  It was determined, at that point in time, that we would consider  discharge planning  and worked with home health and case management set  up the necessary materials needed for patient to go home.  Discussed the  case with Dr. Malvin Johns, who agreed that, once he removed his sutures,  patient could go home.  Continued to improve without any problems and we  will go ahead and discharge patient home to home care with specific  instructions for wound care.  His condition is stable.  He will be discharged on the following medications, which include:   DISCHARGE MEDICATIONS:  1. Lotrel 5/20 mg one p.o. daily.  2. Aspirin 81 mg one p.o. daily.  3. Metoprolol 50 mg one p.o. daily.  4. Torsemide 20 mg one p.o. daily.  5. Klor-Con 20 mEq one p.o. daily.  6. Simvastatin 20 mg one p.o. in the a.m.  7. Omeprazole 20 mg one p.o. q.h.s.  8. Levothyroxine 75 mcg one p.o. daily.   NEW MEDICATIONS INCLUDE:  1. Prednisone 20 mg in a tapered dose three pills p.o. times two days,      two pills p.o. times two days, one pill p.o. times two days, then      stop.  2. Levaquin 750 mg once a day times 5 days.  3. Nu-Iron 150 mg once a day.  4. Cardizem 240 mg once a day.  5. Albuterol 2.5 mg with nebulizer treatments three times a day   FOLLOWUP:  He will follow up with the following doctors.  1. Dr. Malvin Johns.  I have put two to three weeks, but he may decide      patient to follow up sooner than that regarding wound care.  2. Dr. Domingo Sep in two to three weeks for A-fib.  3. Dr. Modesto Charon, his primary care physician, in one week.   He has been instructed to crush meds and give them with puree and to  have a mechanically soft diet and thin liquids.  He is to return if he  has any problems and his disposition will be to home with home health  care.      Dorris Singh, DO  Electronically Signed     CB/MEDQ  D:  07/10/2007  T:  07/10/2007  Job:  [id number]

## 2010-10-11 NOTE — Group Therapy Note (Signed)
NAME:  [name], [name] NO.:  [id number]   MEDICAL RECORD NO.:  [id number]          PATIENT TYPE:  INP   LOCATION:  A203                          FACILITY:  [facility]   PHYSICIAN:  Edward L. Juanetta Gosling, M.D.DATE OF BIRTH:  Nov 13, 1924   DATE OF PROCEDURE:  DATE OF DISCHARGE:                                 PROGRESS NOTE   [name] continues to have some rhonchi.  He now has his nasogastric  tube removed and he seems a little more alert.   EXAM:  Shows temperature is 97.8, pulse 75, respirations 28, blood  pressure 127/66, O2 sats 91% on 2-1/2 liter.  Blood sugar 247 earlier  was 301.  His chest still shows some rhonchi.  His heart is regular.   His chest x-ray yesterday showed bilateral pulmonary infiltrates and he  is currently on Zosyn and vancomycin, so he certainly has got good  coverage for that.  It is fairly clear that he is not going to be able  to be discharged directly home.  He is going to need some help at home.  Still unclear if he is going to need placement.      Edward L. Juanetta Gosling, M.D.  Electronically Signed     ELH/MEDQ  D:  07/02/2007  T:  07/02/2007  Job:  [id number]

## 2010-10-11 NOTE — Group Therapy Note (Signed)
NAME:  [name], [name] NO.:  [id number]   MEDICAL RECORD NO.:  [id number]          PATIENT TYPE:  INP   LOCATION:  A203                          FACILITY:  [facility]   PHYSICIAN:  Edward L. Juanetta Gosling, M.D.DATE OF BIRTH:  01/26/1925   DATE OF PROCEDURE:  DATE OF DISCHARGE:                                 PROGRESS NOTE   Patient of the [facility] hospitalist team.   SUBJECTIVE:  [name] seems to be doing better.  His family has  apparently decided that he would do want to have him to do a short-term  rehab placement which I think is the appropriate decision for them,  because he is really still quite sick.   PHYSICAL EXAMINATION:  Shows temperature is 97.9, pulse 75, respirations  22, blood pressure 133/57, O2 sats 93%.  Blood sugars 245.  His chest is clearer.  His heart is regular.  The abdomen is seems fairly soft.   ASSESSMENT:  I think he is doing well, comparatively.   PLAN:  To continue his treatments.  He has had the post-op pneumonia but  he is clearly getting better as far as that is concerned.  The major  problem now is that he still quite weak.  I do not plan to change  anything else.      Edward L. Juanetta Gosling, M.D.  Electronically Signed     ELH/MEDQ  D:  07/11/2007  T:  07/12/2007  Job:  [id number]

## 2010-10-11 NOTE — Group Therapy Note (Signed)
NAME:  [name], [name] NO.:  [id number]   MEDICAL RECORD NO.:  [id number]          PATIENT TYPE:  INP   LOCATION:  A203                          FACILITY:  [facility]   PHYSICIAN:  Edward L. Juanetta Gosling, M.D.DATE OF BIRTH:  1925-03-23   DATE OF PROCEDURE:  DATE OF DISCHARGE:                                 PROGRESS NOTE   Patient of the [facility] hospitalist team.  [name] seems to be  improving some.  He still somewhat short of breath.  He still coughing  but he looks a little more comfortable and I think he is doing a little  bit better.   EXAM:  Shows temperature is 98.3, pulse is 79, respirations 20, blood  pressure 130/81, O2 sats 90%.  Blood sugar 277.  It was 306.  His chest is I think somewhat clearer although he still has pretty  significant rhonchi.  Bringing up some sputum.   His blood cultures from the 30th were both negative.  No growth.  His  white blood count is 10,900, hemoglobin is 10, platelets 252,000.  He  does have a left shift.  BMET shows his BUN is 28, creatinine 1.31.  And  his chest x-ray most recently does show bilateral infiltrates.  However,  he seems to be improving somewhat.  He is on antibiotic coverage.  I do  not think there is a great deal to add he is slowly recovering from his  surgery.      Edward L. Juanetta Gosling, M.D.  Electronically Signed     ELH/MEDQ  D:  07/03/2007  T:  07/03/2007  Job:  [id number]

## 2010-10-11 NOTE — Group Therapy Note (Signed)
NAME:  [name], [name] NO.:  [id number]   MEDICAL RECORD NO.:  [id number]          PATIENT TYPE:  INP   LOCATION:  IC03                          FACILITY:  [facility]   PHYSICIAN:  Edward L. Juanetta Gosling, M.D.DATE OF BIRTH:  01-25-1925   DATE OF PROCEDURE:  DATE OF DISCHARGE:                                 PROGRESS NOTE   SUBJECTIVE:  [name] seems to be doing better.  He was able to be  extubated yesterday.  He has no new complaints but he is a little  confused.   PHYSICAL EXAMINATION:  GENERAL:  He is awake, alert.  Pulse rate 60,  blood pressure 121/69, respirations 15.  CHEST:  Much clearer.   LABORATORY DATA:  O2 sat 96%.  His blood gas on 50% O2 PO2 is 91, PCO2  of 33.  pH 7.42.  Magnesium is 2.5.  Phosphorus 2.7.  White count 6200,  hemoglobin is 11, platelets 155.  Current metabolic profile shows  potassium is 3.4, glucose 157, albumin low at 1.6.   ASSESSMENT AND PLAN:  We may be able to taper his O2 a bit today.  I  will see how he does when he switches and see if he can tolerate a nasal  cannula although as he is just on 50%, that is unlikely.      Edward L. Juanetta Gosling, M.D.  Electronically Signed     ELH/MEDQ  D:  06/29/2007  T:  06/29/2007  Job:  [id number]

## 2010-10-11 NOTE — H&P (Signed)
NAME:  [name], [name] NO.:  [id number]   MEDICAL RECORD NO.:  [id number]          PATIENT TYPE:  INP   LOCATION:  A329                          FACILITY:  [facility]   PHYSICIAN:  Gardiner Barefoot, MD    DATE OF BIRTH:  08-05-1924   DATE OF ADMISSION:  07/12/2007  DATE OF DISCHARGE:  LH                              HISTORY & PHYSICAL   CHIEF COMPLAINT:  Abdominal distention.   HISTORY OF PRESENT ILLNESS:  This is an 77 year old male recently  discharged from status post incarcerated hernia repair with exploratory  laparotomy and small bowel resection who had gone to the rehab center  yesterday however was sent back here today for concerns of worsening  abdominal distention. Otherwise the patient has had normal bowel  movements, denies any abdominal pain, no fever and is eating well. In  fact, the patient reports he is hungry now.   PAST MEDICAL HISTORY:  1. Status post small bowel resection from incarcerated hernia.  2. Status post pneumonia.  3. Dysphagia.  4. Hyperglycemia.  5. Hypertension.  6. Hemorrhoids.  7. History of myocardial infarction.  8. Gastroesophageal reflux disease.   MEDICATIONS:  1. Prednisone wean currently at 40 mg daily x2 days and then will wean      to 20 mg x2 days and off.  2. Levaquin 750 mg x4 more days.  3. Cardizem 240 mg p.o. daily.  4. Lotrel 5/20 mg p.o. daily.  5. Metoprolol XL 50 mg daily.  6. Torsemide 20 mg daily.  7. Klor-Con 20 mEq daily.  8. Simvastatin 20 mEq daily.   ALLERGIES:  No known drug allergies.   SOCIAL HISTORY:  He lives with spouse and denies any history of smoking,  drinking or drug abuse.   FAMILY HISTORY:  Noncontributory.   REVIEW OF SYSTEMS:  Negative except as per the history of present  illness.   PHYSICAL EXAMINATION:  VITAL SIGNS:  Temperature is 97.5, pulse is 108,  respirations 24, blood pressure is 144/72 and O2 saturations 98% on room  air.  GENERAL:  The patient is awake and alert  and appears in no acute  distress.  CARDIOVASCULAR:  Tachycardic with a regular rhythm, no murmurs, rubs or  gallops.  LUNGS:  Clear to auscultation bilaterally.  ABDOMEN:  Surgical incision clean, dry, nontender, soft, positive bowel  sounds.  EXTREMITIES:  2+ pitting edema.  GU:  Scrotal edema.   LABORATORY DATA:  Sodium 135, potassium 3.9, chloride 104, bicarb 28,  glucose 200, BUN 23, creatinine is 1.29, albumin is 2.0, AST 23, ALT 40,  WBC is 10.7, hemoglobin 8.5, platelets 218, A1c is 7.1.   ASSESSMENT/PLAN:  1. Abdominal distention. Will admit the patient for observation. He      was seen by surgery, Dr. Malvin Johns, who feels that his abdomen is      the same as it was at discharge. No acute intervention indicated.  2. Diabetes. The patient has hyperglycemia secondary to prednisone and      will put on sliding scale insulin and watch closely.  3. Edema. The patient  has significant lower extremity that may be      secondary partly to heart failure, although he has had no recent      echo. The other issue may be that since his albumin is pretty low      he may be third spacing his fluid. Will check an echocardiogram and      try 2 doses of Lasix IV to see if he gets some of the fluid off and      improves.  4. Hypertension. Will continue the patient's home medications.  5. Pneumonia. Will continue with the Levaquin for another 4 days.   DISPOSITION:  The patient's family reports that they prefer to take the  patient home now and therefore he will not be returning back to the  rehab center. Will discuss with case management about necessary things  that he will need at home which will likely take place early next week  getting case management involved after the weekend      Gardiner Barefoot, MD  Electronically Signed     RWC/MEDQ  D:  07/12/2007  T:  07/12/2007  Job:  [id number]

## 2010-10-11 NOTE — Group Therapy Note (Signed)
NAME:  [name], SHEAR NO.:  [id number]   MEDICAL RECORD NO.:  [id number]          PATIENT TYPE:  OBV   LOCATION:  A326                          FACILITY:  [facility]   PHYSICIAN:  Dorris Singh, DO    DATE OF BIRTH:  Jan 02, 1925   DATE OF PROCEDURE:  06/24/2007  DATE OF DISCHARGE:                                 PROGRESS NOTE   Patient seen this morning covered in a bilious vomit that he keeps  spitting up uncontrollably, not necessarily dry heaving. However, it is  all over his clothes.  It is all over his bed.  Was called last night  for issues regarding his nausea which has not been controlled with the  Phenergan and Zofran.  As well, the patient is extremely hard of  hearing, so it is hard to elicit any kind of history from him.   PHYSICAL EXAMINATION:  VITAL SIGNS FOR TODAY:  98.5 for temperature,  pulse 120, respirations 20, blood pressure 126/77.  GENERAL:  This is an 77 year old Caucasian male who is seen today in  some distress.  HEART:  Tachy sinus.  LUNGS:  Clear to auscultation bilaterally.  ABDOMEN:  Has some increased diffuse tenderness.  The patient said he is  more uncomfortable.  As mentioned before, the patient has two hernias,  an umbilical hernia and a ventral hernia that is the size of a  grapefruit on the left hand side.  It is still soft and reducible to  some degree.  There is a little bit of tenderness to that, but this was  the same amount of tenderness that was noted last night on initial exam.  EXTREMITIES:  Positive pulses.  No edema noted.   I reviewed his labs.  Still pending his CBC and BMETs.  Will also add  some liver enzymes to that as well.   ASSESSMENT AND PLAN:  1. Nausea and vomiting.  This now seems to have increased in      frequency.  Will go ahead and put an NG tube down, and put him on      intermittent suction to see if this helps get that under control.      Will also continue with antiemetics.  2. Dehydration.  Will  continue with IV fluids as well.  Will see if      his hypokalemia has corrected.  3. His blood pressure seems to be under control as well as his      hyponatremia.  4. Due to this progression in his case, we will go ahead and get a CT      of the abdomen to rule out any kind of small bowel obstruction, and      with the presence of these hernias and a history of strangulation,      we will go ahead and try to rule out a small bowel obstruction as      well, and will get some LFTs, and await any further recommendations      that might be obtained from GI.      Dorris Singh, DO  Electronically Signed    CB/MEDQ  D:  06/24/2007  T:  06/24/2007  Job:  [id number]

## 2010-10-11 NOTE — Op Note (Signed)
NAME:  [name], [name] NO.:  [id number]   MEDICAL RECORD NO.:  [id number]          PATIENT TYPE:  OBV   LOCATION:  A326                          FACILITY:  [facility]   PHYSICIAN:  Barbaraann Barthel, M.D. DATE OF BIRTH:  1925-05-23   DATE OF PROCEDURE:  DATE OF DISCHARGE:                               OPERATIVE REPORT   SURGEON:  Dr. Malvin Johns.   POSTOPERATIVE DIAGNOSIS:  Incarcerated ventral (recurrent incisional)  hernia.   PROCEDURE:  Exploratory laparotomy, small-bowel resection, repair of  ventral hernia with 6 x 16-cm MTF graft.   SPECIMENS:  Small bowel approximately 8 inches of midjejunum.   NOTE:  This is an 77 year old white male who was admitted through the  emergency room with nausea and vomiting.  A CT scan revealed  incarcerated hernia with eminent obstruction and the patient clinically  had a large nonreducible ventral hernia and guaiac-positive stools.  I  took him to surgery after hydration and initiation of antibiotic  therapy.  I discussed with his family that this patient would be  complicated because of his medical problems and I discussed the  complications with the surgery not limited to but including bleeding,  infection and bowel leak.  Informed consent was obtained by the patient  as well as his wife and his daughter who has power of attorney.   GROSS OPERATIVE FINDINGS:  The patient had a large hernia sac which had  compromise bowel within it.  This bowel did not appear viable and during  the dissection an enterotomy was inadvertently done, however, this area  was dusky and was going to be resected at any rate.  There was a minimal  amount of spillage and this was stapling oversewn immediately.  Other  findings:  I did not palpate anything in the entire small bowel or large  colon that was any abnormalities.  The patient had extensive adhesions,  however and the patient was repaired previously with some sort of metal  coils for the graft  and these coils were actually very adherent to the  area of the bowel that was resected almost penetrating the wall of  bowel.  These coils were removed as much as possible from the area of  dissection prior to any repair.   TECHNIQUE:  The patient was placed in supine position after the adequate  administration of general anesthesia via endotracheal intubation his  entire abdomen was prepped with Betadine solution and draped in the  usual manner.  Midline incision was carried out over the previous area  of scar tissue and with tedious dissection the abdomen was opened  through the midline with the above findings.  There was a large hernia  sac on the left side of the abdomen.  The bowel was removed from that  there were these coils that were affecting the small bowel and an area  of dusky small bowel was encountered in the hernia sac.  This was  dissected free and an enterotomy was noted.  The enterotomy occurred and  this was oversewn immediately, minimal leaking from this area.  I then  resected this with a GIA stapling device and then performed a side-to-  side closed stapled anastomosis, repairing the mesentery with 3-0  Polysorb.  We continued the surgery taking down the adhesions and then I  repaired the defect in the abdominal wall using a 6 x 16-cm MTF graft.  After this was sutured in place around the perimeter of the incision I  used part of the remaining portion of the mesh.  It was so adherent to  the fascia this was good suturing material as well along the periphery.  We did use that and this was sutured.  The MTF graft was sutured below  the fascia using a 0-0 Prolene suture around the perimeter.  I then  placed a Jackson-Pratt drain that was able to drain not only over the  graft site but also this large hernia pocket that was closed off.  We  irrigated with normal saline solution.  After checking for hemostasis we  then closed the skin using the stapling device.  The  drain was sutured  in place with 3-0 nylon.  Prior to closure all sponge, needles counts  found be correct.  Estimated blood loss was minimal, I would say less  than 100 mL.  The patient received 5000 mL of crystalloids and 500 mL of  Hespan.  The patient was then taken to the intensive care unit to be  placed on a ventilator as the patient did not tolerate an extubation  postoperatively.   We discussed the surgery in detail with the patient his family,  discussing the small bowel resection and the need for it and we also  told them that his prognosis was guarded depending upon his respiratory  and cardiac status.  We will obtain appropriate consultations with Dr.  Juanetta Gosling for ventilator control and have [facility] see him as  well.  The patient will continue to be on the [facility] teams service  and I will follow obviously from the surgical standpoint.      Barbaraann Barthel, M.D.  Electronically Signed     WB/MEDQ  D:  06/24/2007  T:  06/25/2007  Job:  [id number]   cc:   Dr. Carlyle Basques L. Juanetta Gosling, M.D.  Fax: [phone]   [facility] card.   GI svc

## 2010-10-11 NOTE — Group Therapy Note (Signed)
NAME:  [name] NO.:  [id number]   MEDICAL RECORD NO.:  [id number]          PATIENT TYPE:  INP   LOCATION:  A203                          FACILITY:  [facility]   PHYSICIAN:  Edward L. Juanetta Gosling, M.D.DATE OF BIRTH:  [birth year]   DATE OF PROCEDURE:  07/09/2007  DATE OF DISCHARGE:                                 PROGRESS NOTE   Patient of the [facility] hospitalist team.   SUBJECTIVE:  [name] is I think better in general.  Seems to be  slowly improving.  He did have a pneumonia, but that seems to have  improved.  I do not think this is a ventilator associated pneumonia.  This appears to have happened more after he was extubated and may have  aspirated, although it is not totally clear.   PHYSICAL EXAMINATION:  His exam this morning shows temperature is 98.1,  pulse is 73, respirations 22, blood pressure 133/73, O2 sats 94%.  Blood  sugar 245, it was 298 earlier.  His chest is clearer and he looks better in general, although he still  weak.   His white count is 10,300.  His hemoglobin is 8.9, platelets 317; his  electrolytes shows BUN of 24, creatinine 1.08, calcium is 7.6, potassium  is 4.2.   ASSESSMENT:  He has had some serious surgery for an incarcerated ventral  hernia for which he is recovering; he has what I suspect is chronic  obstructive pulmonary disease, although he has not officially been  diagnosed with that as best I can tell until this admission.  He had a  pneumonia which is possibly an aspiration.  He is profoundly weak, but  overall he is improving markedly.      Edward L. Juanetta Gosling, M.D.  Electronically Signed     ELH/MEDQ  D:  07/09/2007  T:  07/09/2007  Job:  [id number]

## 2010-10-11 NOTE — Group Therapy Note (Signed)
NAME:  [name], [name] NO.:  [id number]   MEDICAL RECORD NO.:  [id number]          PATIENT TYPE:  INP   LOCATION:  A203                          FACILITY:  [facility]   PHYSICIAN:  Edward L. Juanetta Gosling, M.D.DATE OF BIRTH:  November 19, 1924   DATE OF PROCEDURE:  DATE OF DISCHARGE:                                 PROGRESS NOTE   [name] is a patient of the [facility] Team and has had surgery for  a incisional hernia.  This morning he is more alert more awake, and is  he is not as short of breath,  and he looks a bit more comfortable but  he does still have wheezing bilaterally.   EXAM:  Shows temperature is 97.5, pulse 77, respirations 20, blood  pressure 133/61, O2 sats 93% on a 35% mask and also 93% on 3 liters  which he had on while I was eating.  Blood sugars 250.  His chest still showing some audible wheezing from across the room.  His heart is regular.  His abdomen soft, seems to be healing.  His extremities showed no edema.   ASSESSMENT:  He has post-op pneumonia, possible aspiration.  He has post-  op state.   PLAN:  Continue his treatments.  He has bilateral infiltrates.  He is  being treated with antibiotics and does seem to be improving although  slowly.  I do not plan to change anything else and will continue to  follow with you.  I do think he is making some progress.      Edward L. Juanetta Gosling, M.D.  Electronically Signed     ELH/MEDQ  D:  07/04/2007  T:  07/04/2007  Job:  [id number]

## 2010-10-11 NOTE — Group Therapy Note (Signed)
NAME:  [name], [name] NO.:  [id number]   MEDICAL RECORD NO.:  [id number]          PATIENT TYPE:  INP   LOCATION:  A203                          FACILITY:  [facility]   PHYSICIAN:  Edward L. Juanetta Gosling, M.D.DATE OF BIRTH:  28-Feb-1925   DATE OF PROCEDURE:  DATE OF DISCHARGE:                                 PROGRESS NOTE   Patient of the hospitalist team.   SUBJECTIVE:  [name] seems to be doing much better.  He has no new  complaints except he is having a little bit of abdominal discomfort but  he looks very comfortable as far as his breathing is concerned.  He does  not appear to be short of breath.  His chest is clear.   His temperature is 97.9, pulse 87, respirations 18, blood pressure  144/78, O2 sats 97% on 2 liters.  His chest is mentioned is much clearer.  His heart is regular.  His abdomen fairly soft and bowel sounds are active.   ASSESSMENT:  I think overall he is much improved and seems to be getting  better pretty much on a daily basis.  He of course does have what  appears to be a post-op pneumonia but he has done quite well in the last  several days and seems to be clearing that very well.      Edward L. Juanetta Gosling, M.D.  Electronically Signed     ELH/MEDQ  D:  07/10/2007  T:  07/11/2007  Job:  [id number]

## 2010-10-11 NOTE — Group Therapy Note (Signed)
NAME:  [name], BRAFFORD NO.:  [id number]   MEDICAL RECORD NO.:  [id number]          PATIENT TYPE:  INP   LOCATION:  A203                          FACILITY:  [facility]   PHYSICIAN:  Dorris Singh, DO    DATE OF BIRTH:  Oct 19, 1924   DATE OF PROCEDURE:  DATE OF DISCHARGE:                                 PROGRESS NOTE   The patient seen today sitting upright.  States he feels pretty good.  Does not have any complaints.  Reviewed his chest x-ray from yesterday.  Still has some infiltrates on the right side but clinically the patient  is much improved.  Also reviewed diabetic management recommendations.  Will change protocol to moderate and will change as diet order to carb  modified.  Also speech therapy saw today recommended the mechanical soft  diet with thin liquids.  Will crush medicine give with puree.  He needs  full supervision during meals.   The patient's vitals are as follows.  Temperature 97.5, pulse 68,  respirations 20, blood pressure 133/75.  Currently pulse ox being a 94%  on 2 liters.  The patient is an 77 year old male who is well-nourished, well-developed  in no acute distress.  His heart is regular rate and rhythm.  LUNGS:  Some rhonchi but they are improving throughout.  His abdomen is soft, nontender, nondistended with appropriate tenderness  at incisional site.  EXTREMITIES:  Positive pulses.  He has T.E.D. hose on.   His labs are as follows for today.  He is afebrile.  His CBC white count  is 7.5, hemoglobin 9.1, hematocrit 26.1 and had a platelet count of  301,000.  His chemistries within normal limits.  Glucose being 222 and  BUN being 25, creatinine is 1.18.  His calcium is 7.4, albumin 1.7 and  his ALT is 58.   ASSESSMENT:  1. Pneumonia.  The patient is clinically improved.  He is on 2 liters.      He is on nasal cannula.  Currently will keep him on antibiotic      therapy.  Keep with nebulizing treatments as scheduled.  2. Status post  strangulated hernia repair.  The patient is doing well.      He is eating and continues to progress.  3. Dysphasia the patient was seen by speech pathology do a modified      barium swallow studies.  They have given recommendations which have      been put on the orders to have him continue with thin liquids and      pureed mechanically soft diet.  Crush his meds with puree and have      full supervision.  4. Hyperglycemia also had the diabetic management team give their      recommendations which has been ordered as well.      Dorris Singh, DO  Electronically Signed     CB/MEDQ  D:  07/08/2007  T:  07/08/2007  Job:  [id number]

## 2010-10-11 NOTE — Group Therapy Note (Signed)
NAME:  [name], [name] NO.:  [id number]   MEDICAL RECORD NO.:  [id number]          PATIENT TYPE:  INP   LOCATION:  A203                          FACILITY:  [facility]   PHYSICIAN:  Dorris Singh, DO    DATE OF BIRTH:  1924/11/18   DATE OF PROCEDURE:  07/05/2007  DATE OF DISCHARGE:                                 PROGRESS NOTE   Patient seen today sitting up in chair, is breathing a little more  comfortable.  He is currently still on a Ventimask.  He states that he  feels more improved today than he has in the last couple of days.  Spoke  with patient, stated that he is eating.  He was able to eat a little bit  of his dinner last night and ate all of his breakfast with the help of a  family member but states that he does feel more improved.   VITALS TODAY:  Temperature 96.8, pulse 71, respirations 22, blood  pressure 122/68.  GENERAL:  This is an 77 year old male who is well-developed, well-  nourished, who is in no acute distress.  He is also hard of hearing.  HEART:  Regular rate and rhythm.  LUNGS:  He had generalized rhonchi throughout, the left is greater than  right.  ABDOMEN:  Distended but soft, with appropriate tenderness after surgery.  There is some drainage on the incision and patient still has a wound  drain in place.  EXTREMITIES:  Positive pulses, no edema, ecchymosis or cyanosis.  He  currently has TED hose on.   ASSESSMENT AND PLAN:  1. Pneumonia.  Patient seems clinically to be more improved.  His      white count is decreasing.  Will continue with current therapy and      will continue him on the Zosyn and vancomycin.  2. Status post strangulated hernia repair.  He seems to be improving.      He is eating, he has had a bowel movement in the last 48 hours.      Will continue to monitor this as well.  3. Hyperglycemia.  He was stopped on TPN yesterday.  May do sliding      scale coverage on him  __________ since he is still having elevated  blood sugars.   His hypertension is controlled and currently he is still on the  Ventimask.  We will continue with that and have Surgery see if they can  change him back to nasal cannula when appropriate, when his sats are  stable at 92%.  Patient was also seen by Physical Therapy for bedside  swallow study.  Speech Therapy did the beside swallow for dysphagia,  states that he shows signs of clinical oropharyngeal dysphagia which is  consistent when he has  nectars and thin liquids.  They have recommended  that he use a chin tuck to facilitate swallow and he has a delay due to  switching foods between his buccal area several times prior to  swallowing.  His wife is very attentive.  His diet is honey-thick and  pureed.  It  is recommended and we will schedule a modified barium  swallow if the patient cannot  tolerate this secondary to respiratory distress.  We will continue to  monitor him and make any further recommendations as needed.  Also,  Physical Therapy is seeing him and Dr. Domingo Sep is seeing him and  recommends that we check his BNP and continue to follow and change  therapy as needed.      Dorris Singh, DO  Electronically Signed     CB/MEDQ  D:  07/05/2007  T:  07/05/2007  Job:  [id number]

## 2010-10-11 NOTE — Group Therapy Note (Signed)
NAME:  CAI, ANFINSON NO.:  [id number]   MEDICAL RECORD NO.:  [id number]          PATIENT TYPE:  INP   LOCATION:  A203                          FACILITY:  [facility]   PHYSICIAN:  Dorris Singh, DO    DATE OF BIRTH:  04-11-1925   DATE OF PROCEDURE:  07/07/2007  DATE OF DISCHARGE:                                 PROGRESS NOTE   Patient seen today, doing very well, up in chair.  States he has been  eating without any difficulty.  Feels much more improved.  States that  he is breathing better.  He is also on nasal cannula.   VITAL SIGNS:  Temperature 98.5, pulse 61, respirations 24, blood  pressure 124/77.  GENERAL APPEARANCE:  This is an 77 year old male who is well-developed,  well-nourished, in no acute distress.  HEART:  Regular rate and rhythm.  LUNGS:  He does have generalized rhonchi throughout.  Seems unchanged  from yesterday.  ABDOMEN:  Distended but soft with appropriate tenderness after surgery.  Bowel sounds are in all four quadrants.  EXTREMITIES:  Pulses, no edema, cyanosis or ecchymosis.  Patient has on  TED hose.   LABORATORY DATA:  His white count is 7.4, hemoglobin 9.3, hematocrit  27.3, platelet count 301.  His chemistry is within normal limits except  for his glucose being 199, BUN 26, creatinine 1.2.   ASSESSMENT:  1. Pneumonia.  Patient clinically seems more improved.  He is      currently on nasal cannula and will continue antibiotic therapy.      Will also get a chest x-ray today.  2. Status post strangulation hernia repair.  Patient has continued to      improve.  Has had a bowel movement.  Is eating.  Will continue to      monitor him.  Note, Dr. Malvin Johns is doing surgical incision care.  3. Hyperglycemia.  Patient seems to be doing better with his glucose      levels.  Will continue him on sliding scale.      Dorris Singh, DO  Electronically Signed     CB/MEDQ  D:  07/07/2007  T:  07/07/2007  Job:  [id number]

## 2010-10-11 NOTE — H&P (Signed)
NAME:  [name], IMBERT NO.:  [id number]   MEDICAL RECORD NO.:  [id number]          PATIENT TYPE:  OBV   LOCATION:  A326                          FACILITY:  [facility]   PHYSICIAN:  Dorris Singh, DO    DATE OF BIRTH:  1925/03/30   DATE OF ADMISSION:  06/23/2007  DATE OF DISCHARGE:  LH                              HISTORY & PHYSICAL   The patient is an 77 year old Caucasian male who presented to [facility]  emergency room after a 1-day history of nausea, vomiting and diarrhea.  The patient has had multiple episodes of diarrhea and emesis which is  now characterized as bilious.  Also, the patient was unable to eat  anything.  He just felt so weak.  With his episodes of diarrhea, he has  had bright red blood per rectum with liquid stools where the bowl is red  multiple times today.  His wife then brought him into the emergency room  to be evaluated.  The patient does have a history of hemorrhoids and  admits to having a colonoscopy at  GI sometime this year (his  wife was unsure of the month).  They do not remember his doctor's name,  but hopefully this will help in obtaining records if needed.   PAST MEDICAL HISTORY:  1. Hypertension.  2. Hemorrhoids.  3. Per wife, he has had an MI.   SOCIAL HISTORY:  Nonsmoker, nondrinker.  No drug abuse.  Lives with  spouse.  The patient is extremely hard of hearing.  He does wear hearing  aids.   ALLERGIES:  No known drug allergies.   CURRENT MEDICATIONS:  1. Amlodipine/benazepril 5/20 daily.  2. Furosemide oral 40 mg once a day.  3. Metoprolol tartrate 50 mg once a day.  4. Omeprazole 20 mg once a day.  5. Potassium chloride 20 mEq once a day.  6. Simvastatin 20 mg once a day.  7. Aspirin 81 mg once a day.   CONSTITUTIONAL:  Positive for weakness.  HEENT:  Negative for ear pain  or changes in vision.  Eyes, nose, mouth and throat negative for pain.  CARDIOVASCULAR:  Negative for chest pain.  RESPIRATORY:  Negative  for  cough, dyspnea and wheezing.  GASTROINTESTINAL:  Positive for nausea,  vomiting, diarrhea and bloody stools.  Negative for abdominal pain.  GU:  Negative for dysuria and flank pain.  MUSCULOSKELETAL:  Negative for  arthralgias, back pain and neck pain.  SKIN:  Negative for rash.  NEUROLOGIC:  Negative for headache.  PSYCHIATRIC:  Negative for  depression.  HEMATOLOGIC:  Negative for anemia.   PHYSICAL EXAMINATION:  VITAL SIGNS:  Blood pressure 139/71, pulse rate  107, respirations 24, temperature 99.9.  GENERAL:  The patient is a Well-developed, well-nourished, Caucasian man  who is in no acute distress.  HEENT:  Normocephalic and atraumatic.  Eyes - pupils are equal, round  and reactive to light and accommodation.  Extraocular movements intact.  Nose - turbinates are moist.  Mouth - mucosa is dry.  No exudate noted.  NECK:  Supple.  No lymphadenopathy felt.  HEART:  Regular rate and rhythm.  LUNGS:  Clear to auscultation bilaterally.  No wheezes, rales or  rhonchi.  ABDOMEN:  Soft, nontender, nondistended, but diffuse tenderness with  deep palpation.  The patient has one very large ventral hernia.  It is  reproducible.  It is the size of a grapefruit on the abdomen.  Also, he  has an umbilical hernia, as well, that is soft and tender to the touch.  EXTREMITIES:  Positive pulses.  No ecchymosis or cyanosis.   LABORATORY DATA FOR TODAY:  Chemistry - Sodium is 132, potassium 3.3,  chloride 101, carbon dioxide 22, glucose 221, BUN 21, creatinine 1.34.  CBC revealed a white count of 10.4, hemoglobin 15.5, hematocrit 46.3 and  platelet count of 190.   ASSESSMENT AND PLAN:  1. Nausea, vomiting and diarrhea, etiology unknown.  2. Dehydration.  3. Bright red blood per rectum times several episodes.  4. Hypokalemia.  5. Hypertension.  6. Hyponatremia.   PLAN:  Will admit the patient to 23-hour observation to 3A.  Due to  patient's cardiac history, will go ahead and place him on  remote  telemetry.  Will hydrate him at 110 cc per hour.  Will do serial  hemoglobins and hematocrits x6.  Will get a GI consult in the morning  regarding bright red blood per rectum and hemorrhoids.  Will put him on  TED hose.  Will also put him on Protonix 40 mg IV push.  Will give him  Phenergan and Lomotil, as well as getting stool cultures.  The patient's  temperature is elevated.  Will go ahead and get some blood cultures, as  well, and a UA to make sure he does not have another source of  infection.  Will continue to monitor him and hopefully be able to  discharge him as soon as he improves.  Will also replace his potassium,  as well.      Dorris Singh, DO  Electronically Signed     CB/MEDQ  D:  06/24/2007  T:  06/24/2007  Job:  [id number]

## 2010-10-11 NOTE — Group Therapy Note (Signed)
NAME:  [name], HOECKER NO.:  [id number]   MEDICAL RECORD NO.:  [id number]          PATIENT TYPE:  INP   LOCATION:  A203                          FACILITY:  [facility]   PHYSICIAN:  Dorris Singh, DO    DATE OF BIRTH:  1925-05-24   DATE OF PROCEDURE:  DATE OF DISCHARGE:                                 PROGRESS NOTE   The patient was seen today sitting up in bed.  He states that he feels  very well.  We have him now on 2 liters of oxygen. Chest x-ray yesterday  was somewhat improved but he still had some infiltrates in the right  lower lobe. At this point in time, we will discuss discharge planning. I  talked with Dr. Malvin Johns today as to when he would like to discharge  him.  He requested that due to the complexity of his surgery and the  findings intraoperatively that we will go ahead and keep him for one  more day and then remove his sutures tomorrow to promote better adhesion  as well as decreasing the chances of him returning and so I have spoken  with case management and social services regarding this.  We will go  ahead and set up the home today through [facility] and then we  will plan on discharging after suture removal. Also the patient is on IV  medications. Will go ahead and transition him to p.o. Instructions have  been for them to crush his medications so this will also be instructions  for the home health nurse as well.   VITAL SIGNS:  His vitals today are as follows, temperature 97.4, pulse  81, respirations 16, blood pressure 141/73.  GENERAL:  This is an 77 year old male who is well-developed, well-  nourished in no acute distress.  HEART:  Regular rate and rhythm.  LUNGS:  Improved aeration with minimal wheezing.  ABDOMEN:  Soft, nontender with appropriate tenderness.  EXTREMITIES:  Positive SCDs, positive pulses.   LABORATORY DATA:  Today white count of 10.3, hemoglobin of 8.9,  hematocrit 26.1, platelet count of 317.  His sodium is within  normal  limits, potassium within normal limits, chloride 112, CO2 24, glucose  has been running high at 225 and this is due to steroids. His BUN is 24,  his creatinine is 1.08. His calcium is 7.6.   ASSESSMENT/PLAN:  1. Pneumonia.  The patient is clinically improved.  He is still on      nasal cannula, had a good night.  Will switch his antibiotics to      p.o. and will take him off the vancomycin. Also will make sure that      we continue with nebulizing treatments for home health as well as      possible assessment for O2.  2. Status post hernia repair. Will plan on removing his seizures      tomorrow. The patient is eating well and will have home health also      deal with wound care.  3. Dysphagia. This has already been discussed. Will continue to give  recommendations for them to crush his      medicines and give them in puree.  4. Hyperglycemia. He is on diabetic management. We will stop his      steroids however, and see if this helps him to0. Will have him      follow-up with his primary care physician for any type of coverage      for diabetes.      Dorris Singh, DO  Electronically Signed     CB/MEDQ  D:  07/09/2007  T:  07/09/2007  Job:  [id number]

## 2010-10-11 NOTE — Group Therapy Note (Signed)
NAME:  [name], [name] NO.:  [id number]   MEDICAL RECORD NO.:  [id number]          PATIENT TYPE:  INP   LOCATION:  A329                          FACILITY:  [facility]   PHYSICIAN:  Gardiner Barefoot, MD    DATE OF BIRTH:  06-03-1924   DATE OF PROCEDURE:  DATE OF DISCHARGE:                                 PROGRESS NOTE   SUBJECTIVE:  No acute events overnight and no complaints today.  Denies  any pain.   OBJECTIVE:  VITAL SIGNS:  Temperature 97.2, pulse 71, respirations 20  and blood pressure is 136/71 and O2 saturation is 94% on 2-L nasal  cannula.  GENERAL:  The patient is awake and alert and appears in no  acute distress.  CARDIOVASCULAR:  Regular rate and rhythm with no  murmurs, rubs, or gallops appreciated.  LUNGS:  Clear to auscultation  bilaterally.  ABDOMEN:  Soft and nontender with surgical changes.  Positive bowel sounds, normoactive.  EXTREMITIES:  Decreased edema, 1 to  2+, pitting.   LABORATORY DATA:  BMP with glucose of 170, otherwise within normal  limits including a creatinine of 1.1; CBC with a mildly elevated WBC at  11 and 94% neutrophils, which is stable from previous.   ASSESSMENT AND PLAN:  1. Bowel resection for incarcerated hernia:  The patient's abdomen      does appear to be stable and is followed by Surgery as well.  We      will defer any management to Surgery, but at this time no active      issues.  2. Pneumonia: The patient is continuing on his course of Levaquin.  3. Congestive heart failure:  The patient does have some notable edema      and is responding to Lasix.  We will continue with the Lasix at      this time; however, a lot of his edema is also secondary to his      poor albumin and therefore I suspect he will continue to have some      third-spacing until his albumin significantly improves, although it      is slowly improving at the time.  4. Congestive heart failure:  The patient has not had any recent      echocardiogram;  therefore, we will order a routine echocardiogram      at this time.  5. Diabetes:  The patient is on sliding-scale insulin and is likely      hyperglycemic secondary to steroids, which are being weaned at this      time.  Prednisone will be weaned over the next several days.  6. Blood pressure:  The patient will continue with his blood pressure      medication.  7. Hyperlipidemia:  The patient will continue on simvastatin.   DISPOSITION:  The patient's family prefer to take the patient back home  now rather than return to the rehab center.  We will discuss with Case  Management on Monday needs for the home prior to discharge during the  week.      Gardiner Barefoot,  MD  Electronically Signed    RWC/MEDQ  D:  07/13/2007  T:  07/14/2007  Job:  [id number]

## 2010-10-11 NOTE — Group Therapy Note (Signed)
NAME:  ONOFRE, GAINS NO.:  [id number]   MEDICAL RECORD NO.:  [id number]          PATIENT TYPE:  INP   LOCATION:  A203                          FACILITY:  [facility]   PHYSICIAN:  Edward L. Juanetta Gosling, M.D.DATE OF BIRTH:  10-11-1924   DATE OF PROCEDURE:  DATE OF DISCHARGE:                                 PROGRESS NOTE   SUBJECTIVE:  Ms. Petrelli seems to be doing better.  He has no new  complaints except he is thirsty.  His exam shows his blood pressure  111/63, temperature 98.3, pulse 74, respirations 17, O2 sats 95% on 3  liters.  His blood sugars in the 200s. His chest shows some rhonchi.  His heart is regular.  He is receiving a nebulizer treatment right now.   ASSESSMENT:  He is postop with hernia surgery. He had respiratory  failure but is better now.   PLAN:  Is to continue his medications. Continue his treatments. No  changes today and I will continue to follow.      Edward L. Juanetta Gosling, M.D.  Electronically Signed     ELH/MEDQ  D:  07/01/2007  T:  07/01/2007  Job:  [id number]

## 2010-10-11 NOTE — Op Note (Signed)
NAME:  REG, BIRCHER NO.:  [id number]   MEDICAL RECORD NO.:  [id number]          PATIENT TYPE:  INP   LOCATION:  A203                          FACILITY:  [facility]   PHYSICIAN:  Gardiner Barefoot, MD    DATE OF BIRTH:  [birth year]   DATE OF PROCEDURE:  DATE OF DISCHARGE:  07/11/2007                               OPERATIVE REPORT   Please see previously dictated discharge summary from July 10, 2007.  Since that discharge summary yesterday, there have been no significant  changes, no changes in medications or other events overnight. The  patient today is awake and alert and up out of bed. He is in no acute  distress. The patient is to be discharged to nursing home today and has  been instructed to followup with his primary care physician in one week,  Dr. Domingo Sep in 2-3 weeks and Dr. Malvin Johns in 2-3 weeks.      Gardiner Barefoot, MD  Electronically Signed     RWC/MEDQ  D:  07/11/2007  T:  07/11/2007  Job:  [id number]

## 2010-10-11 NOTE — Discharge Summary (Signed)
NAME:  [name], RATHE NO.:  [id number]   MEDICAL RECORD NO.:  [id number]          PATIENT TYPE:  INP   LOCATION:  A329                          FACILITY:  [facility]   PHYSICIAN:  Skeet Latch, DO    DATE OF BIRTH:  10-12-1924   DATE OF ADMISSION:  07/12/2007  DATE OF DISCHARGE:  02/18/2009LH                               DISCHARGE SUMMARY   ADMITTING DIAGNOSES:  1. Abdominal distention.  2. Diabetes.  3. Lower extremity edema.  4. Hypertension.  5. Pneumonia.   DISCHARGE DIAGNOSES:  1. Bowel distention secondary to bowel resection for incarcerated      hernia.  2. Pneumonia.  3. Probable congestive heart failure, with lower extremity edema,      improved.  4. Diabetes.  5. History of hyperlipidemia.  6. Hypokalemia.  7. Hypertension.   PRIMARY CARE PHYSICIAN:  Either Dr. Morrie Sheldon or Dr. Jacqulyn Bath.   CONSULTANTS:  Barbaraann Barthel, M.D.   BRIEF HOSPITAL COURSE:  Please see history and physical done by Dr.  Luciana Axe.  This is an 77 year old male who was recently discharged for an  titrated hernia with exploratory laparotomy and small-bowel resection  and was sent to a rehab facility, who presented back to the emergency  room with worsening abdominal distention.  The patient was having normal  bowel movements, denied any abdominal pain, fever, and was eating well.  Initial labs on admission showed sodium 135, potassium 3.9, chloride  104, bicarbonate 20, glucose 200, BUN 23, creatinine 1.29, albumin 2,  AST 23, ALT 40.  White count 10.7, hemoglobin 8.5, platelets 218.  The  patient was found to have some pitting edema in his lower extremities  and some bowel distention, but his surgical incision was clean and dry  and intact.  The patient was admitted for his abdominal distention and  was seen by Dr. Malvin Johns, who felt that his abdomen was pretty  unremarkable and expected that it was secondary to his surgery.  He was  found to be hyperglycemic that was probably  secondary to the patient  being on prednisone.  He was placed on sliding scale.  For the edema,  the patient was placed on IV diuretics and seemed to be improving with  this.  He still has some lower extremity edema, but it seems to be  improving.  The patient had an echocardiogram that did not show any  significant abnormalities.  His abdomen x-ray demonstrated no acute  findings status post his recent abdominal surgery.  It did show  improving pulmonary aeration and bilateral airspace opacities.  The  patient's progress has been improving daily.  The patient did not  complain of any abdominal pain and has been eating.  The patient was  placed on IV antibiotics for a recent pneumonia.  These have been  discontinued as of July 17, 2007.  He was found to be slightly  thrombocytopenic.  His Lovenox was discontinued also.  The patient was  given diabetic education as well as nutrition counseling regarding his  meals.  The patient's hemoglobin did drop yesterday.  The patient was  given 2 units of packed red blood cells.  His hemoglobin has improved  and is stable at this time.  Family members want to take the patient  home as of discharge.  He was at a rehab facility.  We did not  recommended this at this time secondary to his condition, but the family  really wants to take him home, and we will send him home at this time.   VITAL SIGNS ON DISCHARGE:  Temperature is 97.8, pulse 79, respirations  20, blood pressure 133/55.   LABORATORIES:  Sodium 136, potassium 3.3, chloride 100, CO2 31, glucose  139, BUN 21, creatinine 1.20.  White count 8.9, hemoglobin 9.8,  hematocrit 28.2, platelets 123.   MEDICATIONS AT DISCHARGE:  1. Albuterol 2.5 mg on nebulizers as needed.  2. Lotrel 5/20 mg once daily.  3. Aspirin 81 mg daily.  4. Metoprolol 50 mg daily,  5. Simvastatin 20 mg daily.  6. Omeprazole 20 mg daily.  7. Levothyroxine 75 mcg daily.  8. Nu-Iron 150 mg daily.  9. Cardizem 240 mg  daily.  10.Lasix 60 mg p.o. daily.  11.Klor-Con 20 mEq p.o. twice a day.   DISCHARGE INSTRUCTIONS:  The patient is to maintain low-sodium. heart-  healthy diet.  For his wound care, he is to keep the dressing clean,  dry, and be changed 2-3 times daily.  The patient had home health prior  to being admitted this time.  He probably could benefit from home health  to manage his wound care.  If this is the case, he should continue with  wound care.   ACTIVITIES:  Increase his activity slowly.   The patient is to follow up with his primary care physician in at least  1 week for followup, and the patient is to follow up with Dr. Malvin Johns  for any previous appointment made prior to being admitted to the  hospital.  I believe the patient and family understand these.  As  stated, I do not feel the patient should be going home.  He probably  needs a facility, but we will honor the wishes of family and wife at  this time.      Skeet Latch, DO  Electronically Signed     SM/MEDQ  D:  07/17/2007  T:  07/18/2007  Job:  [id number]

## 2010-10-11 NOTE — Consult Note (Signed)
NAME:  [name], [name] NO.:  [id number]   MEDICAL RECORD NO.:  [id number]          PATIENT TYPE:  OBV   LOCATION:  IC03                          FACILITY:  [facility]   PHYSICIAN:  Edward L. Juanetta Gosling, M.D.DATE OF BIRTH:  November 11, 1924   DATE OF CONSULTATION:  06/25/2007  DATE OF DISCHARGE:                                 CONSULTATION   REFERRING PHYSICIAN:  [facility] hospitalist team.   HISTORY:  [name] is an 77 year old who came to the emergency room  with nausea, vomiting and diarrhea.  He developed eventually so much  vomiting that he developed bilious vomiting.  He has not been able to  eat.  He has been very weak and he eventually was discovered to have a  large ventral hernia that was incarcerated and ended up having to have  surgery.  He is now intubated on a ventilator and consultation is  requested for help with ventilator management.   PAST MEDICAL HISTORY:  Positive for hypertension, coronary artery  occlusive disease.  He is not known to have any COPD.  He is very hard-  of-hearing.  He has had multiple hernia surgeries.  He has a history of  hyperlipidemia, hypothyroidism.   MEDICATIONS PRIOR TO ADMISSION:  1. Include Amlodipine Lotensin 05/20 one daily.  2. Aspirin 81 mg daily.  3. Metoprolol 50 mg daily.  4. Lasix 40-80 mg daily.  5. Potassium chloride 20 mEq daily.  6. Simvastatin 80 mg daily.  7. Omeprazole 20 mg daily.  8. Levofloxacin 75 mcg daily.   ALLERGIES:  He has no known drug allergies.   FAMILY HISTORY:  Apparently is negative for carcinoma of the liver or  chronic GI problems.   SOCIAL HISTORY:  Does not use tobacco, alcohol or illicit drugs.   REVIEW OF SYSTEMS:  Unobtainable at this point.   PHYSICAL EXAMINATION:  GENERAL:  Shows an intubated elderly male who  appears fairly comfortable.  VITAL SIGNS:  He has temperature of 102, pulse rate about 130, blood  pressure in the 90s.  HEENT:  Pupils are reactive to light and  accommodation. Mucous membranes  are slightly dry.  CHEST:  His chest shows some rhonchi.  HEART:  His heart is regular without gallop.  I Do not hear a murmur  although he has been said to have aortic sclerosis.  I do not hear the  murmur now.  He has a lot of airway noise.  ABDOMEN:  His abdomen is soft postop.  I did not do a full abdominal  examination.  EXTREMITIES:  He does not have any edema.  CNS:  Grossly intact.   Chest x-ray done on 27th shows endotracheal tube in satisfactory  position.  NG tube is coiled in the stomach.  He has cardiac  enlargement, low lung volume, tortuous aorta, bibasilar atelectasis and  bronchitic changes.   LAB WORK:  Urine culture of course is pending.  BMET:  Potassium is 3.5,  BUN 27, creatinine 1.56, sodium is 134 slightly low.  CBC shows white  blood count 3500, hemoglobin 12.7, platelets 158, pro time 20.3  with an  INR of 1.7.  Blood gas on 50% 700, rate of 12, 5 of PEEP, pH 7.45, pCO2  of 31, pO2 of 73.   ASSESSMENT:  He has had surgery for a ventral hernia.  He has coronary  artery occlusive disease. The extent of that is not totally known.  He  has multiple previous problems with hernias.  He has respiratory  failure, mostly as a result of his postop situation.  He is afebrile.  He has an elevated heart rate and he does have a somewhat elevated  prothrombin time.   PLAN:  With his temperature, with his heart rate, etc. I do not think he  is going to be able to be weaned quite yet but if we can get his  temperature down, etc. since apparently he does not have any native lung  disease, we should be able to see about trying to get him weaned but I  doubt if it is going to happen today.      Edward L. Juanetta Gosling, M.D.  Electronically Signed     ELH/MEDQ  D:  06/25/2007  T:  06/25/2007  Job:  [id number]

## 2010-10-11 NOTE — Group Therapy Note (Signed)
NAME:  [name], [name] NO.:  [id number]   MEDICAL RECORD NO.:  [id number]          PATIENT TYPE:  INP   LOCATION:  IC03                          FACILITY:  [facility]   PHYSICIAN:  Edward L. Juanetta Gosling, M.D.DATE OF BIRTH:  [birth year]   DATE OF PROCEDURE:  DATE OF DISCHARGE:                                 PROGRESS NOTE   Patient of the [facility]ist Team.  [name] is much more  alert after investigation yesterday.  Part of the problem that he was  having with his reduction in O2 saturation was that he had a defective  probe.  He seems improved this morning.   He is awake, he is alert and he is responsive.  It is a good cough  effort.  CHEST:  Shows rhonchi.  His heart rates in the 70s, blood pressure was 102/57, O2 sats 95%,  respirations are 19.   His white count is 5400, hemoglobin is up to 11.2, platelets 145,000.  Comp metabolic profile shows potassium is 3.1 which we to replace, BUN  24, creatinine 1.29.  His albumin is 1.70.  He has been started on TPN.  Magnesium is 2.6, phosphorus 1.9, slightly low and his BNP 128.  He did  not have blood gas yet because we are working on weaning.   ASSESSMENT:  He is improving.   PLAN:  I am hopeful that we can get him off the ventilator today.      Edward L. Juanetta Gosling, M.D.  Electronically Signed     ELH/MEDQ  D:  06/28/2007  T:  06/28/2007  Job:  [id number]

## 2010-10-14 NOTE — Discharge Summary (Signed)
NAME:  [name], [name]                         ACCOUNT NO.:  [id number]   MEDICAL RECORD NO.:  [id number]                   PATIENT TYPE:  INP   LOCATION:  5703                                 FACILITY:  [facility]   PHYSICIAN:  Melissa L. Ladona Ridgel, MD               DATE OF BIRTH:  07-17-1924   DATE OF ADMISSION:  09/09/2003  DATE OF DISCHARGE:  09/12/2003                                 DISCHARGE SUMMARY   ADMISSION DIAGNOSES:  1. Abdominal pain and nausea secondary to small bowel obstruction.  2. History of myocardial infarction with known coronary artery disease.  3. Previous abdominal surgeries which may be the source of adhesions related     to #1.   DISCHARGE DIAGNOSES:  1. Partial small bowel obstruction now resolved using conservative therapy.  2. History of myocardial infarction with coronary artery disease.  No signs     or symptoms of heart failure at this time.  3. Constipation, resolved using Colace and Dulcolax.   DISCHARGE MEDICATIONS:  1. Toprol XL 50 mg daily.  2. Lotensin 20 mg daily.  3. Norvasc 5 mg daily.  4. The patient can resume his Lotrel 5/25 which is an equivalent combination     pill.  5. Clonidine 0.2 mg q.24h. patch change every 7 days, which appears new at     this admission.  6. Lasix 40 mg daily.  7. Xalatan 0.005% one drop both eyes at bedtime.  8. Colace 100 mg t.i.d.  9. Prilosec 20 mg daily.   DIET:  Increased fiber.   FOLLOWUP:  Please make an appointment to see Dr. Dewaine Conger in one week.   HISTORY OF PRESENT ILLNESS:  The patient is a 77 year old gentleman with  known history of coronary artery disease, status post myocardial infarction,  history of hernia repair, who presents with one day of worsening generalized  edema, nausea and vomiting for fecal material at least twice prior to  admission.  He has no fever or chills, diarrhea or constipation.  He denied  any cardiovascular symptoms of PND, orthopnea, or chest pain, but in the  emergency  room was found to be vomiting yellow fecal material.  A NG tube  was placed and copious material was recovered from his abdomen.  He is  admitted to the telemetry service to treat what appeared to be a small bowel  obstruction on x-ray.  Surgery consult was obtained with Dr. Luan Pulling, and  who supported conservative therapy with NG tube suction, bowel rest, and IV  hydration, as well as antiemetics and pain medication.  The patient did well  within the first 24 hours.  Was noted to have decreased material output from  his NG tube.  By 48 hours, was able to tolerate clear liquids.  His nausea  and vomiting had disappeared.  On the day of discharge, the patient is  tolerating full cardiac diet without nausea or vomiting.  He has moved his  bowels x2, and does not appear to be in any distress related to his  cardiovascular status.  He has been able to ambulate the hallways without  difficulty.   DISCHARGE PHYSICAL EXAMINATION:  VITAL SIGNS:  Temperature 97.6, blood  pressure 139/72, pulse of 74, respiratory rate of 18, saturation 96%.  His  I's and O's for the day prior to discharge 2664 in, 3025 out with two bowel  movements overnight.  GENERAL:  The patient is well-developed, well-nourished in no acute  distress.  HEENT:  Pupils equal, round, reactive to light.  Extraocular movements were  intact.  Mucous membranes are moist.  NECK:  Supple, there is no JVD.  CHEST:  Clear to auscultation with no wheezes, rhonchi, or rales.  On the  night prior to discharge, he did have some basilar crackles bilaterally;  however, these have resolved with Lasix.  ABDOMEN:  Soft, nontender, minimally distended, obese, positive bowel  sounds.  EXTREMITIES:  Show 2+ pulses with no edema.   The patient appears stable for discharge to home to follow up with Dr.  Dewaine Conger.  We have instructed him to increase the fiber in his diet, as well  as use a stool softener, Colace 100 mg t.i.d.  Should he develop  nausea or  vomiting, or increasing abdominal pain he should speak with Dr. Dewaine Conger or  come to the emergency room for follow up care.  It is possible that at some  point he may require surgical intervention, but at this time he has  responded to conservative therapy.  Please note that the patient during this  admission has been started on a clonidine patch 0.2 mg q.24h., which he  appears to be tolerating, and we will leave the continuation of this new  medication up to his primary care physician.  If it is desirous to add an  oral agent that would be more effective for Mr. Marengo blood pressure  control, the clonidine should be weaned so as not to precipitate a rebound  hypertension.   LABORATORY DATA:  A white count of 6.8 at discharge, hemoglobin of 13.4,  hematocrit 38.7, platelet count 196.  His basic metabolic panel on the day  of discharge showed a sodium of 135, potassium 3.4, chloride 104, CO2 26,  BUN 9, creatinine 1.1, glucose 117.   Urinalysis obtained during this admission was unremarkable.  His LFTs on  admission were unremarkable.   CONDITION ON DISCHARGE:  Stable.                                                Melissa L. Ladona Ridgel, MD    MLT/MEDQ  D:  09/12/2003  T:  09/14/2003  Job:  [id number]   cc:   Vikki Ports, M.D.  [address], [address]  [place]  [place] [address]  Fax: [phone]   Colon Flattery, D.O.  [address]  [place]  [place] [address]  Fax: [phone]   Sheppard Plumber. Earlene Plater, M.D.  [address] [place]  [place] [address]  Fax: [phone]

## 2010-10-14 NOTE — Consult Note (Signed)
NAME:  [name], [name] NO.:  [id number]   MEDICAL RECORD NO.:  [id number]                   PATIENT TYPE:  INP   LOCATION:  5703                                 FACILITY:  [facility]   PHYSICIAN:  Vikki Ports, M.D.         DATE OF BIRTH:  1924/12/07   DATE OF CONSULTATION:  09/09/2003  DATE OF DISCHARGE:                                   CONSULTATION   REFERRING PHYSICIAN:  Jackie Plum, M.D.   HISTORY OF PRESENT ILLNESS:  The patient is a 77 year old white male who is  a very poor historian.  He came to the emergency room with abdominal pain  last night.  This resolved while in the emergency room, but then the patient  began vomiting.  Acute abdominal series was consistent with small-bowel  obstruction.  NG tube was placed, and Dr. Julio Sicks admitted the patient  for bowel rest, NG tube decompression, and IV therapy.  I was consulted for  surgical management.   The patient denies any previous history of small-bowel obstruction.  Has had  a number of hernia repairs, according to our office note, in [place].  He has not had any surgery that I am aware of here in [place].   PAST MEDICAL HISTORY:  Consistent with hypertension.   PAST SURGICAL HISTORY:  1. Cholecystectomy.  2. Two hernia repairs in [place].   MEDICATIONS:  1. Toprol XL 50 mg a day.  2. Lotrel 5/20 one daily.  3. Lasix 40 mg daily.  4. Clonidine patch 0.2 mg weekly.   PHYSICAL EXAMINATION:  GENERAL:  Age-appropriate white male in no distress.  VITAL SIGNS:  Temperature 97, heart rate 80, respiratory rate 20, blood  pressure 159/80.  HEENT:  Normocephalic and atraumatic.  Pupils equal, round, and reactive to  light.  LUNGS:  Clear to auscultation and percussion.  HEART:  Regular rate and rhythm without murmurs, rubs, or gallops.  ABDOMEN:  Distended and rotund with high-pitched bowel sounds, completely  nontender with easily reducible hernias.  NG tube is  in place with  significant feculent drainage.   IMPRESSION:  Small-bowel obstruction.   PLAN:  NG tube decompression and IV hydration.                                               Vikki Ports, M.D.    KRH/MEDQ  D:  09/09/2003  T:  09/09/2003  Job:  [id number]   cc:   Jackie Plum, M.D.

## 2010-10-14 NOTE — Assessment & Plan Note (Signed)
[facility]                         GASTROENTEROLOGY OFFICE NOTE   [name], [name]                      MRN:          [id number]  DATE:06/04/2006                            DOB:          1925/02/04    REASON FOR CONSULTATION:  Rectal bleeding.   ASSESSMENT:  A 77 year old white man who has had rectal bleeding off-and-  on for at least a few months with bright red blood passed into the  toilet. His stools have been soft and watery in the past year. He has  some gas and bloating problems. He believes this is due to hemorrhoids  (the bleeding).   RECOMMENDATIONS AND PLAN:  Colonoscopy to investigate the change in  bowel habits and the rectal bleeding. Risks, benefits and indications  are explained. He understands and agrees to proceed.   HISTORY:  As above, this 58 -year-old white man has had problems with  rectal bleeding passing bright red blood with his bowel movements  intermittently. He tells me has been checked and told he had  hemorrhoids. He has never had a colonoscopy or at least if he has, it  has been years ago. He did see a physician assistant and Dr. Tresa Endo  recently and was given preoperative clearance for his colonoscopy on  May 30, 2006. A hemoglobin was 14 on October 9th. CT of the abdomen  and pelvis on April 18, 2006, shows a moderate to large ventral  hernia containing small bowel and bilateral inguinal hernias containing  fat.   PAST MEDICAL HISTORY:  1. Multiple hernia surgeries.  2. Hypertension.  3. Previous myocardial infarction with remote percutaneous coronary      intervention in 1996.  4. Aortic valve sclerosis and aortic insufficiency.  5. Dyslipidemia.  6. Hypothyroidism.   MEDICATIONS:  1. Toprol XL 50 mg daily.  2. Lasix 40 mg daily.  3. Lotrel 5/20 mg daily.  4. Aspirin 81 mg daily.  5. Fish oil.  6. Prilosec 20 mg daily.  7. Levothyroxine 75 mg daily.  8. Simvastatin 20 mg daily.   DRUG  ALLERGIES:  NORVASC HAS CAUSED LEG SWELLING.   Note, the patient does have well-controlled reflux symptoms on Prilosec.   FAMILY HISTORY:  Heart disease in multiple siblings and parents.  Diabetes in his sister. Alcoholism in a brother. No colon cancer.   SOCIAL HISTORY:  He is married. He is here with his wife. He is retired  from [employer]. No alcohol, tobacco or drugs.   REVIEW OF SYSTEMS:  Positive for fatigue, dyspnea, pedal edema, hearing  difficulty and hearing aids. All other systems are negative or as  reflected in my medical history form.   PHYSICAL EXAMINATION:  Reveals an obese, elderly, white man. Height is 5  feet, 4 inches. Weight 209 pounds. Blood pressure 120/74, pulse 74.  EYES: Anicteric.  ENT: Shows a ruddy face and complexion with dentures in the upper and  lower jaw without other oral lesions.  NECK: Is supple without mass, thyromegaly.  CHEST: Is clear.  HEART: S1, S2. I hear no murmur today. No jugular venous distention.  ABDOMEN: Is  obese, soft. There is a large ventral hernia in the left  lower quadrant area and perhaps a smaller ventral hernia in the  epigastric area consistent with diastasis recti. There is no  organomegaly or mass otherwise. Abdomen is nontender.  RECTAL: Examination is deferred.  LYMPHATIC: No neck or supraclavicular nodes palpated.  EXTREMITIES: Trace bilateral lower extremity edema with some chronic  hyperpigmentation in the pretibial area on skin examination.  NEURO/PSYCH: He appears alert and oriented x3.   I appreciate the opportunity to care for this patient. I have reviewed  the office notes kindly sent from Dr. Landry Dyke office.     Iva Boop, MD,FACG  Electronically Signed    CEG/MedQ  DD: 06/04/2006  DT: 06/04/2006  Job #: [id number]   cc:   Alfredia Client, MD

## 2010-10-14 NOTE — H&P (Signed)
NAME:  [name], [name]                         ACCOUNT NO.:  [id number]   MEDICAL RECORD NO.:  [id number]                   PATIENT TYPE:  INP   LOCATION:  5703                                 FACILITY:  [facility]   PHYSICIAN:  Jackie Plum, M.D.             DATE OF BIRTH:  Jun 01, 1924   DATE OF ADMISSION:  09/09/2003  DATE OF DISCHARGE:                                HISTORY & PHYSICAL   CHIEF COMPLAINT:  Abdominal pain.   HISTORY OF PRESENT ILLNESS:  The patient is a 77 year old gentleman with  history of CAD, status post MI and history of hernia, who presents with one-  day history of worsening generalized abdominal pain with nausea and vomiting  later in the evening x2.  No history of fever or chills, diarrhea,  constipation, PND, orthopnea, chest pain, cough, sputum production or  dysuria.  At the ED, the patient called for Dr. Earlene Plater, the patient's general  surgeon.  Dr. Luan Pulling was seen.  In the emergency room, the patient was  given antiemetics.  He started vomiting, and therefore, the ED physician  called Dr. Luan Pulling, who was on-call for Dr. Earlene Plater.  On account of small  bowel obstruction on abdominal x-ray, and apparently, she had been  discharged home.  However, the symptoms occurred again, and therefore, the  ED called me to evaluate the patient for possible admission.   PAST MEDICAL HISTORY:  Please see HPI.   MEDICATIONS:  1. Toprol 50 mg p.o. daily.  2. Lotrel 5/20 one tablet p.o. daily.  3. Lasix 40 mg p.o. daily.  4. Prilosec 20 mg p.o. daily.   ALLERGIES:  No known drug allergies.   SOCIAL HISTORY:  He denies cigarette smoking or alcohol use.   PHYSICAL EXAMINATION:  VITAL SIGNS:  Blood pressure 145/80, pulse 104,  respiratory rate 20, temperature 98.3 degrees Fahrenheit.  O2 saturation 96%  on room air.  GENERAL:  The patient was vomiting eaten food, yellowish in color at the  time of my evaluation.  He was not in acute cardiopulmonary distress.  HEENT:  Normocephalic and atraumatic.  Pupils equal, round, and reactive to  light.  Extraocular movements intact.  Oropharynx was moist.  NECK:  Supple.  No JVD.  LUNGS:  Clear breath sounds without any crackles.  HEART:  Regular rate and rhythm without any murmurs, rubs, or gallops.  ABDOMEN:  Obese.  Seemed tender.  He had a surgical scar.  Bowel sounds  seemed quite hypoactive.  He had some mild generalized tenderness.  EXTREMITIES:  He had trace bipedal edema which was patent.  He was oriented  x3.  No acute tachycardia at this time.   LABORATORY DATA:  Abdominal x-ray showed small bowel obstruction.  Blood  work; sodium 139, potassium 3.5, chloride 103, BUN 21, glucose 158, pH  7.371, pCO2 44.3, bicarb 25.7, hematocrit 32.0, hemoglobin 14.6, WBC 11.6,  MCV 86.1, platelet count 219. Creatinine 1.4.  UA has been ordered and is  pending.  I could not see the result at the time of my evaluation.   IMPRESSION:  Small bowel obstruction.   PLAN:  The patient will be admitted to the hospitalists' service and Dr.  Luan Pulling will consult.  Will place on antiemetics, NG tube placement,  bowel rest, and pain medications for supportive treatment for now.                                                Jackie Plum, M.D.    GO/MEDQ  D:  09/09/2003  T:  09/09/2003  Job:  [id number]   cc:   Sheppard Plumber. Earlene Plater, M.D.  [address]  [place]  [place] [address]  Fax: [phone]   Vikki Ports, M.D.  [address]  [place]  [place] [address]  Fax: [phone]   Colon Flattery, D.O.  [address]  [place]  [place] [address]  Fax: [phone]

## 2011-02-16 LAB — BASIC METABOLIC PANEL
BUN: 21
BUN: 27 — ABNORMAL HIGH
CO2: 20
CO2: 22
CO2: 24
Calcium: 7.3 — ABNORMAL LOW
Calcium: 7.5 — ABNORMAL LOW
Calcium: 8.7
Chloride: 102
Chloride: 107
Chloride: 111
Chloride: 113 — ABNORMAL HIGH
Chloride: 115 — ABNORMAL HIGH
Creatinine, Ser: 1.38
Creatinine, Ser: 1.56 — ABNORMAL HIGH
GFR calc Af Amer: 43 — ABNORMAL LOW
GFR calc Af Amer: 52 — ABNORMAL LOW
GFR calc Af Amer: 56 — ABNORMAL LOW
GFR calc Af Amer: >60
GFR calc Af Amer: >60
GFR calc non Af Amer: 51 — ABNORMAL LOW
Glucose, Bld: 126 — ABNORMAL HIGH
Glucose, Bld: 221 — ABNORMAL HIGH
Glucose, Bld: 226 — ABNORMAL HIGH
Potassium: 3.3 — ABNORMAL LOW
Potassium: 3.6
Potassium: 3.8
Potassium: 4.1
Sodium: 133 — ABNORMAL LOW
Sodium: 140
Sodium: 140
Sodium: 142

## 2011-02-16 LAB — BLOOD GAS, ARTERIAL
Acid-base deficit: 1.5
Bicarbonate: 20.9
Bicarbonate: 21.3
Bicarbonate: 21.6
Bicarbonate: 21.8
Bicarbonate: 23.2
Bicarbonate: 23.4
FIO2: 50
FIO2: 50
FIO2: 50
FIO2: 80
MECHVT: 700
MECHVT: 700
O2 Saturation: 92
O2 Saturation: 95.2
O2 Saturation: 97.9
O2 Saturation: 99.2
PEEP: 5
PEEP: 5
PEEP: 5
PEEP: 8
Patient temperature: 37
Pressure support: 8
RATE: 12
RATE: 12
TCO2: 18.7
TCO2: 19.5
pCO2 arterial: 31.2 — ABNORMAL LOW
pCO2 arterial: 31.5 — ABNORMAL LOW
pCO2 arterial: 33.4 — ABNORMAL LOW
pCO2 arterial: 36.8
pH, Arterial: 7.381
pH, Arterial: 7.422
pH, Arterial: 7.438
pH, Arterial: 7.44
pH, Arterial: 7.443
pH, Arterial: 7.459 — ABNORMAL HIGH
pO2, Arterial: 171 — ABNORMAL HIGH
pO2, Arterial: 72.9 — ABNORMAL LOW
pO2, Arterial: 78.5 — ABNORMAL LOW
pO2, Arterial: 91.5

## 2011-02-16 LAB — URINALYSIS, ROUTINE W REFLEX MICROSCOPIC
Bilirubin Urine: NEGATIVE
Glucose, UA: 250 — AB
Leukocytes, UA: NEGATIVE
Nitrite: NEGATIVE
Specific Gravity, Urine: >1.03 — ABNORMAL HIGH
pH: 5.5

## 2011-02-16 LAB — CBC
HCT: 25.3 — ABNORMAL LOW
HCT: 29.9 — ABNORMAL LOW
HCT: 31.3 — ABNORMAL LOW
HCT: 32 — ABNORMAL LOW
HCT: 45
Hemoglobin: 10.4 — ABNORMAL LOW
Hemoglobin: 11.2 — ABNORMAL LOW
Hemoglobin: 15.2
Hemoglobin: 15.5
MCHC: 34.9
MCHC: 35.2
MCV: 84.3
MCV: 85
MCV: 85.4
MCV: 85.6
Platelets: 155
Platelets: 158
RBC: 2.96 — ABNORMAL LOW
RBC: 3.5 — ABNORMAL LOW
RBC: 3.78 — ABNORMAL LOW
RBC: 4.29
RDW: 14.1
RDW: 14.3
RDW: 15.4
RDW: 15.6 — ABNORMAL HIGH
WBC: 10.4
WBC: 3.5 — ABNORMAL LOW
WBC: 4.8
WBC: 6.2

## 2011-02-16 LAB — COMPREHENSIVE METABOLIC PANEL
ALT: 26
AST: 25
Albumin: 1.6 — ABNORMAL LOW
Alkaline Phosphatase: 49
Alkaline Phosphatase: 51
BUN: 24 — ABNORMAL HIGH
CO2: 26
Chloride: 113 — ABNORMAL HIGH
Creatinine, Ser: 1.15
GFR calc Af Amer: >60
Glucose, Bld: 151 — ABNORMAL HIGH
Glucose, Bld: 157 — ABNORMAL HIGH
Potassium: 3.1 — ABNORMAL LOW
Potassium: 3.4 — ABNORMAL LOW
Sodium: 140
Total Bilirubin: 1.8 — ABNORMAL HIGH
Total Bilirubin: 2.6 — ABNORMAL HIGH
Total Protein: 4.3 — ABNORMAL LOW

## 2011-02-16 LAB — DIFFERENTIAL
Basophils Absolute: 0
Basophils Absolute: 0
Basophils Absolute: 0
Basophils Relative: 0
Basophils Relative: 0
Basophils Relative: 0
Basophils Relative: 0
Eosinophils Absolute: 0
Eosinophils Absolute: 0
Eosinophils Absolute: 0.1
Eosinophils Relative: 0
Eosinophils Relative: 0
Eosinophils Relative: 1
Eosinophils Relative: 2
Eosinophils Relative: 4
Lymphocytes Relative: 11 — ABNORMAL LOW
Lymphocytes Relative: 16
Lymphocytes Relative: 20
Lymphocytes Relative: 3 — ABNORMAL LOW
Lymphs Abs: 0.3 — ABNORMAL LOW
Lymphs Abs: 0.4 — ABNORMAL LOW
Lymphs Abs: 0.8
Lymphs Abs: 1.1
Monocytes Absolute: 0 — ABNORMAL LOW
Monocytes Absolute: 0.1
Monocytes Absolute: 0.2
Monocytes Absolute: 0.3
Monocytes Absolute: 0.4
Monocytes Relative: 1 — ABNORMAL LOW
Monocytes Relative: 1 — ABNORMAL LOW
Monocytes Relative: 3
Monocytes Relative: 4
Monocytes Relative: 5
Neutro Abs: 2.3
Neutro Abs: 4.6
Neutro Abs: 5.1
Neutro Abs: 9.8 — ABNORMAL HIGH
Neutrophils Relative %: 66
Neutrophils Relative %: 78 — ABNORMAL HIGH

## 2011-02-16 LAB — MAGNESIUM: Magnesium: 2.6 — ABNORMAL HIGH

## 2011-02-16 LAB — FERRITIN: Ferritin: 42 (ref 22–322)

## 2011-02-16 LAB — CULTURE, BLOOD (ROUTINE X 2)
Culture: NO GROWTH
Culture: NO GROWTH
Report Status: 1312009
Report Status: 2042009

## 2011-02-16 LAB — HEPATIC FUNCTION PANEL
AST: 27
Albumin: 3.2 — ABNORMAL LOW

## 2011-02-16 LAB — PROTIME-INR
INR: 1.7 — ABNORMAL HIGH
Prothrombin Time: 20.3 — ABNORMAL HIGH

## 2011-02-16 LAB — CROSSMATCH: Antibody Screen: NEGATIVE

## 2011-02-16 LAB — URINE MICROSCOPIC-ADD ON

## 2011-02-16 LAB — HEMOGLOBIN AND HEMATOCRIT, BLOOD
HCT: 35.8 — ABNORMAL LOW
HCT: 45
Hemoglobin: 12.5 — ABNORMAL LOW

## 2011-02-16 LAB — PREALBUMIN: Prealbumin: 4.8 — ABNORMAL LOW

## 2011-02-16 LAB — IRON AND TIBC: UIBC: 301

## 2011-02-17 LAB — BASIC METABOLIC PANEL
BUN: 23
BUN: 24 — ABNORMAL HIGH
BUN: 28 — ABNORMAL HIGH
BUN: 29 — ABNORMAL HIGH
BUN: 21
BUN: 23
BUN: 25 — ABNORMAL HIGH
BUN: 25 — ABNORMAL HIGH
CO2: 21
CO2: 21
CO2: 28
CO2: 29
CO2: 30
Calcium: 7.4 — ABNORMAL LOW
Calcium: 7.4 — ABNORMAL LOW
Calcium: 7.8 — ABNORMAL LOW
Calcium: 7.8 — ABNORMAL LOW
Calcium: 7.7 — ABNORMAL LOW
Chloride: 111
Chloride: 112
Chloride: 112
Chloride: 102
Chloride: 99
Creatinine, Ser: 1.07
Creatinine, Ser: 1.31
Creatinine, Ser: 1.34
Creatinine, Ser: 1.35
Creatinine, Ser: 1.1
Creatinine, Ser: 1.18
Creatinine, Ser: 1.3
Creatinine, Ser: 1.35
GFR calc Af Amer: >60
GFR calc Af Amer: >60
GFR calc Af Amer: >60
GFR calc Af Amer: >60
GFR calc non Af Amer: 51 — ABNORMAL LOW
GFR calc non Af Amer: 56 — ABNORMAL LOW
GFR calc non Af Amer: >60
GFR calc non Af Amer: >60
GFR calc non Af Amer: 53 — ABNORMAL LOW
GFR calc non Af Amer: 58 — ABNORMAL LOW
GFR calc non Af Amer: 59 — ABNORMAL LOW
Glucose, Bld: 212 — ABNORMAL HIGH
Glucose, Bld: 222 — ABNORMAL HIGH
Glucose, Bld: 223 — ABNORMAL HIGH
Glucose, Bld: 162 — ABNORMAL HIGH
Glucose, Bld: 170 — ABNORMAL HIGH
Potassium: 4
Potassium: 4.2
Potassium: 4.2
Potassium: 3.1 — ABNORMAL LOW
Potassium: 3.3 — ABNORMAL LOW
Potassium: 3.5
Sodium: 137
Sodium: 137
Sodium: 138
Sodium: 141
Sodium: 136

## 2011-02-17 LAB — BLOOD GAS, ARTERIAL
Acid-base deficit: 3.1 — ABNORMAL HIGH
Acid-base deficit: 4.8 — ABNORMAL HIGH
FIO2: 0.35
O2 Content: 3
O2 Saturation: 95.5
Patient temperature: 37
Patient temperature: 37
TCO2: 17.9
pCO2 arterial: 27.3 — ABNORMAL LOW
pH, Arterial: 7.477 — ABNORMAL HIGH
pO2, Arterial: 77.9 — ABNORMAL LOW

## 2011-02-17 LAB — DIFFERENTIAL
Basophils Absolute: 0
Basophils Absolute: 0
Basophils Absolute: 0
Basophils Absolute: 0
Basophils Absolute: 0
Basophils Absolute: 0
Basophils Absolute: 0
Basophils Absolute: 0.1
Basophils Absolute: 0.2 — ABNORMAL HIGH
Basophils Relative: 0
Basophils Relative: 0
Basophils Relative: 0
Basophils Relative: 0
Basophils Relative: 0
Basophils Relative: 1
Basophils Relative: 1
Eosinophils Absolute: 0
Eosinophils Absolute: 0
Eosinophils Absolute: 0
Eosinophils Absolute: 0
Eosinophils Relative: 0
Eosinophils Relative: 0
Eosinophils Relative: 0
Eosinophils Relative: 0
Eosinophils Relative: 0
Eosinophils Relative: 0
Eosinophils Relative: 0
Eosinophils Relative: 0
Eosinophils Relative: 1
Eosinophils Relative: 0
Eosinophils Relative: 0
Eosinophils Relative: 2
Lymphocytes Relative: 3 — ABNORMAL LOW
Lymphocytes Relative: 3 — ABNORMAL LOW
Lymphocytes Relative: 3 — ABNORMAL LOW
Lymphocytes Relative: 3 — ABNORMAL LOW
Lymphocytes Relative: 4 — ABNORMAL LOW
Lymphocytes Relative: 4 — ABNORMAL LOW
Lymphocytes Relative: 8 — ABNORMAL LOW
Lymphocytes Relative: 3 — ABNORMAL LOW
Lymphocytes Relative: 8 — ABNORMAL LOW
Lymphocytes Relative: 9 — ABNORMAL LOW
Lymphs Abs: 0.3 — ABNORMAL LOW
Lymphs Abs: 0.3 — ABNORMAL LOW
Lymphs Abs: 0.3 — ABNORMAL LOW
Lymphs Abs: 0.3 — ABNORMAL LOW
Lymphs Abs: 0.8
Lymphs Abs: 0.9
Lymphs Abs: 0.3 — ABNORMAL LOW
Lymphs Abs: 0.7
Lymphs Abs: 0.8
Lymphs Abs: 0.9
Monocytes Absolute: 0.1
Monocytes Absolute: 0.1
Monocytes Absolute: 0.2
Monocytes Absolute: 0.3
Monocytes Absolute: 0.4
Monocytes Absolute: 0.2
Monocytes Absolute: 0.5
Monocytes Absolute: 0.6
Monocytes Relative: 1 — ABNORMAL LOW
Monocytes Relative: 3
Monocytes Relative: 4
Neutro Abs: 6.9
Neutro Abs: 8 — ABNORMAL HIGH
Neutro Abs: 9.2 — ABNORMAL HIGH
Neutro Abs: 9.3 — ABNORMAL HIGH
Neutro Abs: 10.4 — ABNORMAL HIGH
Neutro Abs: 10.4 — ABNORMAL HIGH
Neutrophils Relative %: 93 — ABNORMAL HIGH
Neutrophils Relative %: 86 — ABNORMAL HIGH
Neutrophils Relative %: 88 — ABNORMAL HIGH

## 2011-02-17 LAB — CBC
HCT: 26.1 — ABNORMAL LOW
HCT: 26.1 — ABNORMAL LOW
HCT: 26.9 — ABNORMAL LOW
HCT: 27.3 — ABNORMAL LOW
HCT: 28.4 — ABNORMAL LOW
HCT: 28.7 — ABNORMAL LOW
HCT: 31.4 — ABNORMAL LOW
HCT: 21.2 — ABNORMAL LOW
HCT: 24.2 — ABNORMAL LOW
HCT: 26.3 — ABNORMAL LOW
HCT: 28.2 — ABNORMAL LOW
Hemoglobin: 10 — ABNORMAL LOW
Hemoglobin: 11.1 — ABNORMAL LOW
Hemoglobin: 8.9 — ABNORMAL LOW
Hemoglobin: 9.1 — ABNORMAL LOW
Hemoglobin: 9.1 — ABNORMAL LOW
Hemoglobin: 9.3 — ABNORMAL LOW
Hemoglobin: 8.2 — ABNORMAL LOW
Hemoglobin: 9.8 — ABNORMAL LOW
MCHC: 34.7
MCHC: 34.9
MCHC: 35
MCHC: 35.3
MCHC: 34.3
MCHC: 34.7
MCV: 84.6
MCV: 85.1
MCV: 85.3
MCV: 85.4
MCV: 85.9
MCV: 86.7
MCV: 87.7
Platelets: 218
Platelets: 226
Platelets: 252
Platelets: 289
Platelets: 301
Platelets: 317
Platelets: 122 — ABNORMAL LOW
Platelets: 123 — ABNORMAL LOW
Platelets: 156
Platelets: 204
RBC: 3.03 — ABNORMAL LOW
RBC: 3.14 — ABNORMAL LOW
RBC: 3.74 — ABNORMAL LOW
RBC: 2.41 — ABNORMAL LOW
RBC: 2.78 — ABNORMAL LOW
RBC: 3 — ABNORMAL LOW
RDW: 15.4
RDW: 15.4
RDW: 15.5
RDW: 15.6 — ABNORMAL HIGH
RDW: 15.7 — ABNORMAL HIGH
RDW: 16.7 — ABNORMAL HIGH
RDW: 17.6 — ABNORMAL HIGH
WBC: 10.3
WBC: 10.3
WBC: 10.7 — ABNORMAL HIGH
WBC: 7.4
WBC: 7.5
WBC: 8.6
WBC: 9.8
WBC: 15.4 — ABNORMAL HIGH
WBC: 8.6
WBC: 8.9

## 2011-02-17 LAB — CROSSMATCH: Antibody Screen: NEGATIVE

## 2011-02-17 LAB — COMPREHENSIVE METABOLIC PANEL
ALT: 180 — ABNORMAL HIGH
ALT: 29
ALT: 35
AST: 23
AST: 28
AST: 35
AST: 37
AST: 84 — ABNORMAL HIGH
Albumin: 1.7 — ABNORMAL LOW
Albumin: 1.7 — ABNORMAL LOW
Albumin: 2 — ABNORMAL LOW
Alkaline Phosphatase: 56
Alkaline Phosphatase: 57
BUN: 18
BUN: 25 — ABNORMAL HIGH
BUN: 28 — ABNORMAL HIGH
CO2: 20
CO2: 21
CO2: 23
CO2: 25
Calcium: 7.4 — ABNORMAL LOW
Calcium: 7.6 — ABNORMAL LOW
Calcium: 7.6 — ABNORMAL LOW
Chloride: 107
Chloride: 107
Chloride: 110
Chloride: 112
Chloride: 113 — ABNORMAL HIGH
Chloride: 114 — ABNORMAL HIGH
Creatinine, Ser: 1.14
Creatinine, Ser: 1.18
Creatinine, Ser: 1.29
Creatinine, Ser: 1.35
Creatinine, Ser: 1.35
GFR calc Af Amer: >60
GFR calc Af Amer: >60
GFR calc Af Amer: >60
GFR calc Af Amer: >60
GFR calc Af Amer: >60
GFR calc non Af Amer: 51 — ABNORMAL LOW
GFR calc non Af Amer: 51 — ABNORMAL LOW
GFR calc non Af Amer: 59 — ABNORMAL LOW
GFR calc non Af Amer: 59 — ABNORMAL LOW
Glucose, Bld: 225 — ABNORMAL HIGH
Potassium: 2.9 — ABNORMAL LOW
Potassium: 4.1
Sodium: 135
Sodium: 138
Sodium: 140
Total Bilirubin: 1
Total Bilirubin: 1
Total Bilirubin: 1.1
Total Bilirubin: 1.1
Total Bilirubin: 1.5 — ABNORMAL HIGH
Total Bilirubin: 1.8 — ABNORMAL HIGH
Total Protein: 3.8 — ABNORMAL LOW
Total Protein: 5 — ABNORMAL LOW

## 2011-02-17 LAB — IRON AND TIBC
Iron: 46
Saturation Ratios: 25
UIBC: 139

## 2011-02-17 LAB — TRIGLYCERIDES: Triglycerides: 115

## 2011-02-17 LAB — MAGNESIUM
Magnesium: 2.4
Magnesium: 2.4

## 2011-02-17 LAB — PHOSPHORUS
Phosphorus: 3.1
Phosphorus: 3.4

## 2011-02-17 LAB — HEMOGLOBIN A1C: Hgb A1c MFr Bld: 7.1 — ABNORMAL HIGH

## 2011-02-17 LAB — PREALBUMIN: Prealbumin: 8.2 — ABNORMAL LOW

## 2011-02-17 LAB — B-NATRIURETIC PEPTIDE (CONVERTED LAB): Pro B Natriuretic peptide (BNP): 110 — ABNORMAL HIGH

## 2011-02-17 LAB — VITAMIN B12: Vitamin B-12: 300 (ref 211–911)

## 2011-02-17 LAB — HEPARIN ANTIBODY SCREEN: Heparin Antibody Screen: NEGATIVE

## 2011-07-03 ENCOUNTER — Encounter (HOSPITAL_COMMUNITY)
Admission: RE | Disposition: A | Discharge status: Home / Self Care | Payer: Self-pay | Source: Ambulatory Visit | Attending: Ophthalmology

## 2011-07-03 ENCOUNTER — Ambulatory Visit (HOSPITAL_COMMUNITY)
Admission: RE | Admit: 2011-07-03 | Discharge: 2011-07-03 | Disposition: A | Discharge status: Home / Self Care | Payer: Medicare Other | Source: Ambulatory Visit | Attending: Ophthalmology | Admitting: Ophthalmology

## 2011-07-03 ENCOUNTER — Encounter (HOSPITAL_COMMUNITY): Payer: Self-pay | Admitting: *Deleted

## 2011-07-03 DIAGNOSIS — H26499 Other secondary cataract, unspecified eye: Secondary | ICD-10-CM | POA: Insufficient documentation

## 2011-07-03 DIAGNOSIS — I10 Essential (primary) hypertension: Secondary | ICD-10-CM | POA: Insufficient documentation

## 2011-07-03 SURGERY — TREATMENT, USING YAG LASER
Anesthesia: LOCAL | Laterality: Left

## 2011-07-03 MED ORDER — APRACLONIDINE HCL 1 % OP SOLN
OPHTHALMIC | Status: AC
  Administered 2011-07-03: 1 [drp] via OPHTHALMIC
  Filled 2011-07-03: qty 0.1

## 2011-07-03 MED ORDER — APRACLONIDINE HCL 1 % OP SOLN
1.0000 [drp] | OPHTHALMIC | Status: AC
  Administered 2011-07-03 (×2): 1 [drp] via OPHTHALMIC

## 2011-07-03 MED ORDER — TROPICAMIDE 1 % OP SOLN
1.0000 [drp] | OPHTHALMIC | Status: AC
  Administered 2011-07-03 (×2): 1 [drp] via OPHTHALMIC

## 2011-07-03 MED ORDER — TROPICAMIDE 1 % OP SOLN
OPHTHALMIC | Status: AC
  Administered 2011-07-03: 1 [drp] via OPHTHALMIC
  Filled 2011-07-03: qty 3

## 2011-07-03 MED ORDER — TETRACAINE HCL 0.5 % OP SOLN
OPHTHALMIC | Status: AC
  Administered 2011-07-03: 1 [drp] via OPHTHALMIC
  Filled 2011-07-03: qty 2

## 2011-07-03 MED ORDER — TETRACAINE HCL 0.5 % OP SOLN
1.0000 [drp] | Freq: Once | OPHTHALMIC | Status: AC
  Administered 2011-07-03: 1 [drp] via OPHTHALMIC

## 2011-07-03 NOTE — H&P (Signed)
See scanned H&P from office 

## 2011-07-03 NOTE — Progress Notes (Signed)
Dr Lita Mains in to see paitent left eye fully dilated no need for frther eye drops per dr Lita Mains

## 2011-07-03 NOTE — Procedures (Signed)
See brief op note

## 2011-07-03 NOTE — Brief Op Note (Addendum)
[name] 07/03/2011  Susa Simmonds, MD  Yag Laser Self Test Completedyes. Procedure: Posterior Capsulotomy, left eye.  Eye Protection Worn by Staff yes. Laser In Use Sign on Door yes.  Laser: Nd:YAG Spot Size: Fixed Burst Mode: III Power Setting: 1.5 mJ/burst Position treated:  central posterior capsule Number of shots: 43 Total energy delivered: 64.4 mJ  Patency of the peripheral iridotomy was confirmed visually.  The patient tolerated the procedure without difficulty. No complications were encountered.  Tenometer reading immediately after procedure: see nurses notes mmHg.  The patient was discharged home with the instructions to continue all his current glaucoma medications, if any.   Patient instructed to go to office per discharge instructions  Patient verbalizes understanding of discharge instructions yes.   Notes:none

## 2011-07-07 ENCOUNTER — Ambulatory Visit (HOSPITAL_COMMUNITY)
Admission: RE | Admit: 2011-07-07 | Discharge: 2011-07-07 | Disposition: A | Discharge status: Home / Self Care | Payer: Medicare Other | Source: Ambulatory Visit | Attending: Family Medicine | Admitting: Family Medicine

## 2011-07-07 ENCOUNTER — Other Ambulatory Visit: Payer: Self-pay | Admitting: Family Medicine

## 2011-07-07 ENCOUNTER — Encounter (HOSPITAL_COMMUNITY): Payer: Self-pay

## 2011-07-07 DIAGNOSIS — R911 Solitary pulmonary nodule: Secondary | ICD-10-CM | POA: Insufficient documentation

## 2011-07-07 DIAGNOSIS — J90 Pleural effusion, not elsewhere classified: Secondary | ICD-10-CM | POA: Insufficient documentation

## 2011-07-07 HISTORY — DX: Essential (primary) hypertension: I10

## 2011-07-20 ENCOUNTER — Telehealth: Payer: Self-pay | Admitting: Internal Medicine

## 2011-07-20 NOTE — Telephone Encounter (Signed)
Patient with occasional rectal bleeding on the tissue and on the stool.  No bleeding in the last tow days.  He is scheduled for a new patient appt for 07/31/11 8:45.  I have mailed him new patient paperwork

## 2011-07-31 ENCOUNTER — Encounter: Payer: Self-pay | Admitting: Internal Medicine

## 2011-07-31 ENCOUNTER — Ambulatory Visit (INDEPENDENT_AMBULATORY_CARE_PROVIDER_SITE_OTHER): Payer: Medicare Other | Admitting: Internal Medicine

## 2011-07-31 VITALS — BP 120/68 | HR 76 | Ht 65.0 in | Wt 204.0 lb

## 2011-07-31 DIAGNOSIS — K625 Hemorrhage of anus and rectum: Secondary | ICD-10-CM

## 2011-07-31 DIAGNOSIS — D509 Iron deficiency anemia, unspecified: Secondary | ICD-10-CM

## 2011-07-31 MED ORDER — PEG-KCL-NACL-NASULF-NA ASC-C 100 G PO SOLR: 1.0000 | Freq: Once | ORAL | Status: DC

## 2011-07-31 NOTE — Patient Instructions (Signed)
You have been scheduled for a colonoscopy. Please follow written instructions given to you at your visit today.  Please pick up your prep kit at the pharmacy within the next 1-3 days. We will request your records from Dr [facility] office.

## 2011-07-31 NOTE — Progress Notes (Addendum)
Subjective:    Patient ID: [name], male    DOB: 1925/02/15, 78 y.o.   MRN: [id number]    HPI This pleasant elderly white man presents for evaluation of rectal bleeding and anemia. Dr. Modesto Charon asked him to be evaluated.  He is known to me from prior colonoscopy in 2008, which showed internal hemorrhoids and sigmoid diverticulosis as an investigation of bleeding. He had a fair adequate prep. In the interim, he has had a small bowel resection and ventral hernia repair. Over the past few months he has had problems with blood into the toilet and with wiping. He says it's when his stools get tight. His daughter and wife indicate that he has bladder stools and does not seem to have hard stools. There is brown loose stools blood into the commode with the blood. Record review shows that he has had an anemia is microcytic and a ferritin level of 20 about one year ago. I do not have any more recent labs at this time. He takes a laxative every day, containing senna. He denies nausea or vomiting, dysphagia, melena. GI review of systems is otherwise negative as best I can tell.  No Known Allergies Outpatient Prescriptions Prior to Visit  Medication Sig Dispense Refill  . amLODipine-benazepril (LOTREL) 5-20 MG per capsule Take 1 capsule by mouth daily.      Marland Kitchen aspirin 81 MG tablet Take 81 mg by mouth daily.      . furosemide (LASIX) 40 MG tablet Take 40 mg by mouth daily.      Marland Kitchen levothyroxine (SYNTHROID, LEVOTHROID) 75 MCG tablet Take 75 mcg by mouth daily.      . metoprolol succinate (TOPROL-XL) 50 MG 24 hr tablet Take 50 mg by mouth daily. Take with or immediately following a meal.      . Natural Senna Laxative 64.8-324 MG TABS Take 2 tablets by mouth at bedtime.       . Omega-3 Fatty Acids (FISH OIL) 1000 MG CAPS Take 1 capsule by mouth daily.      Marland Kitchen omeprazole (PRILOSEC) 20 MG capsule Take 20 mg by mouth daily.      . simvastatin (ZOCOR) 20 MG tablet Take 20 mg by mouth daily.      Marland Kitchen amitriptyline  (ELAVIL) 25 MG tablet Take 25 mg by mouth at bedtime.       Past Medical History  Diagnosis Date  . Diabetes mellitus     Diet control   . Hypertension   . History of pneumonia   . Hemorrhoids   . MI (myocardial infarction) 1996    percutaneous coronary intervention   . GERD (gastroesophageal reflux disease)   . Hypothyroidism   . Diverticulosis   . Aortic insufficiency   . Aortic valve sclerosis   . Dyslipidemia   . Hard of hearing   . Small bowel obstruction    Past Surgical History  Procedure Date  . Exploratory laparotomy w/ bowel resection     ventral hernia repair  . Transurethral resection of prostate   . Cholecystectomy   . Colonoscopy 2008   History   Social History  . Marital Status: Married                 Occupational History  . Retired    Social History Main Topics  . Smoking status: Former Games developer  . Smokeless tobacco: Never Used  . Alcohol Use: No  . Drug Use: No  . Sexually Active: None  Family History  Problem Relation Age of Onset  . Colon cancer Neg Hx         Review of Systems Positive for hearing difficulty, using hearing aids. Occasional confusion, night sweats. He has a cough at times. All other review of systems negative or as per history of present illness    Objective:   Physical Exam General:  Well-developed, well-nourished and in no acute distress - elderly Eyes:  anicteric. ENT:   Mouth and posterior pharynx free of lesions. He has dentures Neck:   supple w/o thyromegaly or mass.  Lungs: Clear to auscultation bilaterally. Heart:  S1S2, no rubs, murmurs, gallops. Abdomen: There is a very large ventral hernia, essentially the whole abdominal wall. The umbilicus is displaced to the right. There is a midline scar. Soft and nontender without obvious organomegaly. Rectal: Anoderm looks normal. Soft brown stool. No rectal mass. Sphincter tone appears normal. Prostate is flat. Lymph:  no cervical or supraclavicular  adenopathy. Extremities:   1+ lower extremity edema lateral Skin   no rash. Psych:  appropriate mood and  Affect.   Data Reviewed: Prior colonoscopy report from 2008.  Lab Results  Component Value Date   WBC 7.6 08/14/2010   HGB 9.7* 08/14/2010   HCT 30.9* 08/14/2010   MCV 76.3* 08/14/2010   PLT 200 08/14/2010   Lab Results  Component Value Date   FERRITIN 20* 08/11/2010    I have requested more recent labs, it sounds like he had complete blood count drawn in the past month.  Labs faxed after visit -  and hemoglobin was 9.3 with MCV 73 on February 8. It was 9.6 on February 11. TSH has been mildly elevated at 4.827. Levothyroxin was increased. Those labs are from February 8 as well. Comprehensive metabolic panel was okay except for glucose 118, calcium 8.3 which was slightly low on 06/15/2011.     Assessment & Plan:   1. Hemorrhage of rectum and anus   2. Iron deficiency anemia, unspecified     He is having recurrent rectal bleeding. He is iron deficiency anemia within the past year. Colonoscopy is appropriate to investigate for the cause of both of these. Risks benefits medications are explained to the patient and family and they understand and agree to proceed.  I will send a copy of this to Dr. Leodis Sias

## 2011-08-03 ENCOUNTER — Ambulatory Visit (AMBULATORY_SURGERY_CENTER): Payer: Medicare Other | Admitting: Internal Medicine

## 2011-08-03 ENCOUNTER — Encounter: Payer: Self-pay | Admitting: Internal Medicine

## 2011-08-03 VITALS — BP 126/79 | HR 77 | Temp 96.8°F | Resp 16 | Ht 65.0 in | Wt 204.0 lb

## 2011-08-03 DIAGNOSIS — D126 Benign neoplasm of colon, unspecified: Secondary | ICD-10-CM

## 2011-08-03 DIAGNOSIS — D509 Iron deficiency anemia, unspecified: Secondary | ICD-10-CM

## 2011-08-03 DIAGNOSIS — K625 Hemorrhage of anus and rectum: Secondary | ICD-10-CM

## 2011-08-03 DIAGNOSIS — K5730 Diverticulosis of large intestine without perforation or abscess without bleeding: Secondary | ICD-10-CM

## 2011-08-03 DIAGNOSIS — K648 Other hemorrhoids: Secondary | ICD-10-CM

## 2011-08-03 LAB — GLUCOSE, CAPILLARY
Glucose-Capillary: 110 mg/dL — ABNORMAL HIGH (ref 70–99)
Glucose-Capillary: 118 mg/dL — ABNORMAL HIGH (ref 70–99)

## 2011-08-03 MED ORDER — SODIUM CHLORIDE 0.9 % IV SOLN: 500.0000 mL | INTRAVENOUS | Status: DC

## 2011-08-03 MED ORDER — HYDROCORTISONE 2.5 % RE CREA: TOPICAL_CREAM | Freq: Two times a day (BID) | RECTAL | Status: AC | PRN

## 2011-08-03 NOTE — Progress Notes (Signed)
Patient did not experience any of the following events: a burn prior to discharge; a fall within the facility; wrong site/side/patient/procedure/implant event; or a hospital transfer or hospital admission upon discharge from the facility. (G8907) Patient did not have preoperative order for IV antibiotic SSI prophylaxis. (G8918)  

## 2011-08-03 NOTE — Patient Instructions (Addendum)
Hemorrhoids are causing the bleeding but cannot be sure that explains the anemia. There were also two benign-looking polyps removed and diverticulosis.  I recommend that you schedule an upper endoscopy exam to look further for cause of anemia. I have prescribed a cream with hydrocortisone  to use for the hemorrhoids. You can apply it when they swell and bleed.  My office should contact you about scheduling the upper endoscopy test - if you do not hear by end of next week call us and ask for Lavonna Rua, my nurse. Iva Boop, MD, FACG   YOU HAD AN ENDOSCOPIC PROCEDURE TODAY AT THE [facility]: Refer to the procedure report that was given to you for any specific questions about what was found during the examination.  If the procedure report does not answer your questions, please call your gastroenterologist to clarify.  If you requested that your care partner not be given the details of your procedure findings, then the procedure report has been included in a sealed envelope for you to review at your convenience later.  YOU SHOULD EXPECT: Some feelings of bloating in the abdomen. Passage of more gas than usual.  Walking can help get rid of the air that was put into your GI tract during the procedure and reduce the bloating. If you had a lower endoscopy (such as a colonoscopy or flexible sigmoidoscopy) you may notice spotting of blood in your stool or on the toilet paper. If you underwent a bowel prep for your procedure, then you may not have a normal bowel movement for a few days.  DIET: Your first meal following the procedure should be a light meal and then it is ok to progress to your normal diet.  A half-sandwich or bowl of soup is an example of a good first meal.  Heavy or fried foods are harder to digest and may make you feel nauseous or bloated.  Likewise meals heavy in dairy and vegetables can cause extra gas to form and this can also increase the bloating.  Drink plenty of fluids but  you should avoid alcoholic beverages for 24 hours.  ACTIVITY: Your care partner should take you home directly after the procedure.  You should plan to take it easy, moving slowly for the rest of the day.  You can resume normal activity the day after the procedure however you should NOT DRIVE or use heavy machinery for 24 hours (because of the sedation medicines used during the test).    SYMPTOMS TO REPORT IMMEDIATELY: A gastroenterologist can be reached at any hour.  During normal business hours, 8:30 AM to 5:00 PM Monday through Friday, call [phone].  After hours and on weekends, please call the GI answering service at [phone] who will take a message and have the physician on call contact you.   Following lower endoscopy (colonoscopy or flexible sigmoidoscopy):  Excessive amounts of blood in the stool  Significant tenderness or worsening of abdominal pains  Swelling of the abdomen that is new, acute  Fever of 100F or higher  FOLLOW UP: If any biopsies were taken you will be contacted by phone or by letter within the next 1-3 weeks.  Call your gastroenterologist if you have not heard about the biopsies in 3 weeks.  Our staff will call the home number listed on your records the next business day following your procedure to check on you and address any questions or concerns that you may have at that time regarding the information  given to you following your procedure. This is a courtesy call and so if there is no answer at the home number and we have not heard from you through the emergency physician on call, we will assume that you have returned to your regular daily activities without incident.  SIGNATURES/CONFIDENTIALITY: You and/or your care partner have signed paperwork which will be entered into your electronic medical record.  These signatures attest to the fact that that the information above on your After Visit Summary has been reviewed and is understood.  Full responsibility  of the confidentiality of this discharge information lies with you and/or your care-partner.

## 2011-08-03 NOTE — Op Note (Signed)
[facility] [address] [place], [place]  [address]  COLONOSCOPY PROCEDURE REPORT  PATIENT:  [name], [name]  MR#:  [id number] BIRTHDATE:  [birth year], 86 yrs. old  GENDER:  male ENDOSCOPIST:  Iva Boop, MD, [facility]  PROCEDURE DATE:  08/03/2011 PROCEDURE:  Colonoscopy with snare polypectomy ASA CLASS:  Class III INDICATIONS:  Iron deficiency anemia, rectal bleeding MEDICATIONS:   These medications were titrated to patient response per physician's verbal order, Fentanyl 50 mcg IV, Versed 4 mg IV  DESCRIPTION OF PROCEDURE:   After the risks benefits and alternatives of the procedure were thoroughly explained, informed consent was obtained.  Digital rectal exam was performed and revealed no rectal masses and normal prostate.   The LB CF-H180AL [id number] endoscope was introduced through the anus and advanced to the terminal ileum which was intubated for a short distance, without limitations.  The quality of the prep was good, using MoviPrep.  The instrument was then slowly withdrawn as the colon was fully examined. <<PROCEDUREIMAGES>>  FINDINGS:  Two polyps were found in the ascending colon. 3-4 mm and 8-10 mm maximum size. Removed with cold snare.  Moderate diverticulosis was found in the sigmoid colon.  Melanosis coli was found in the right colon.  This was otherwise a normal examination of the colon. Includes right colon retroflexion and terminal ileum inspection.   Retroflexed views in the rectum revealed internal hemorrhoids.    The time to cecum = 2:01 minutes. The scope was then withdrawn in 11:38 minutes from the cecum and the procedure completed. COMPLICATIONS:  None ENDOSCOPIC IMPRESSION: 1) Two polyps in the ascending colon - removed 2) Moderate diverticulosis in the sigmoid colon 3) Melanosis in the right colon 4) Internal hemorrhoids 5) Otherwise normal examination, good prep RECOMMENDATIONS: 1) Proctocream HC prn hemorrhoids 2) Schedule EGD to  evaluate anemia further - my office will call 3) Take iron supplement (ferrous sulfate 325 mg) twice a day  Iva Boop, MD, [facility]  CC:  Orvan July MD and The Patient  n. Rosalie Doctor:   Iva Boop at 08/03/2011 10:54 AM  Elita Boone, [phone]

## 2011-08-04 ENCOUNTER — Telehealth: Payer: Self-pay | Admitting: *Deleted

## 2011-08-04 NOTE — Telephone Encounter (Signed)
  Follow up Call-  Call back number 08/03/2011  Post procedure Call Back phone  # [phone], [phone] Daughter [name]     Patient questions:  Do you have a fever, pain , or abdominal swelling? no Pain Score  0 *  Have you tolerated food without any problems? yes  Have you been able to return to your normal activities? yes  Do you have any questions about your discharge instructions: Diet   no Medications  no Follow up visit  no  Do you have questions or concerns about your Care? no  Actions: * If pain score is 4 or above: No action needed, pain <4.

## 2011-08-08 ENCOUNTER — Encounter: Payer: Self-pay | Admitting: Internal Medicine

## 2011-08-08 NOTE — Progress Notes (Signed)
Quick Note:  2 adenomas No recall needed due to age ______

## 2011-08-09 ENCOUNTER — Encounter: Payer: Self-pay | Admitting: Internal Medicine

## 2011-08-09 ENCOUNTER — Ambulatory Visit (AMBULATORY_SURGERY_CENTER): Payer: Medicare Other | Admitting: *Deleted

## 2011-08-09 VITALS — Ht 65.0 in | Wt 203.0 lb

## 2011-08-09 DIAGNOSIS — D509 Iron deficiency anemia, unspecified: Secondary | ICD-10-CM

## 2011-08-23 ENCOUNTER — Encounter: Payer: Self-pay | Admitting: Internal Medicine

## 2011-08-23 ENCOUNTER — Ambulatory Visit (AMBULATORY_SURGERY_CENTER): Payer: Medicare Other | Admitting: Internal Medicine

## 2011-08-23 VITALS — BP 172/96 | HR 73 | Temp 97.7°F | Resp 20 | Ht 65.0 in | Wt 203.0 lb

## 2011-08-23 DIAGNOSIS — K2990 Gastroduodenitis, unspecified, without bleeding: Secondary | ICD-10-CM

## 2011-08-23 DIAGNOSIS — D509 Iron deficiency anemia, unspecified: Secondary | ICD-10-CM

## 2011-08-23 DIAGNOSIS — K2970 Gastritis, unspecified, without bleeding: Secondary | ICD-10-CM

## 2011-08-23 MED ORDER — SODIUM CHLORIDE 0.9 % IV SOLN: 500.0000 mL | INTRAVENOUS | Status: DC

## 2011-08-23 NOTE — Op Note (Signed)
[facility] [address] [place], [place]  [address]  ENDOSCOPY PROCEDURE REPORT  PATIENT:  [name], [name]  MR#:  [id number] BIRTHDATE:  01-24-1925, 86 yrs. old  GENDER:  male  ENDOSCOPIST:  Iva Boop, MD, [facility]  PROCEDURE DATE:  08/23/2011 PROCEDURE:  EGD, diagnostic [phone] ASA CLASS:  Class III INDICATIONS:  anemia  MEDICATIONS:   These medications were titrated to patient response per physician's verbal order, Fentanyl 25 mcg IV, Versed 3 mg IV TOPICAL ANESTHETIC:  Cetacaine Spray  DESCRIPTION OF PROCEDURE:   After the risks benefits and alternatives of the procedure were thoroughly explained, informed consent was obtained.  The LB GIF-H180 [id number] endoscope was introduced through the mouth and advanced to the second portion of the duodenum, without limitations.  The instrument was slowly withdrawn as the mucosa was fully examined. <<PROCEDUREIMAGES>>  Mild gastritis was found in the antrum. Non-erosive.  Otherwise the examination was normal.    Retroflexed views revealed no abnormalities.    The scope was then withdrawn from the patient and the procedure completed.  COMPLICATIONS:  None  ENDOSCOPIC IMPRESSION: 1) Mild gastritis in the antrum 2) Otherwise normal examination RECOMMENDATIONS: 1) Ferrous sulfate 325 mg twice a day 2) Follow-up with PCP next month re: anemia 3) no further GI work-up planned at this time (colonoscopy completed a few weeks ago)  Iva Boop, MD, [facility]  CC:  Rudi Heap, MD and The Patient  n. eSIGNED:   Iva Boop at 08/23/2011 03:40 PM  Elita Boone, [phone]

## 2011-08-23 NOTE — Patient Instructions (Addendum)
Please take ferrous sulfate (325 mg)  - iron - tablets - take two each day. See your primary care doctor next month to follow-up on the anemia. The endoscopy exam was ok - it showed mild gastritis which is not a problem - no further testing planned at this time. Iva Boop, MD, FACG   YOU HAD AN ENDOSCOPIC PROCEDURE TODAY AT THE  ENDOSCOPY CENTER: Refer to the procedure report that was given to you for any specific questions about what was found during the examination.  If the procedure report does not answer your questions, please call your gastroenterologist to clarify.  If you requested that your care partner not be given the details of your procedure findings, then the procedure report has been included in a sealed envelope for you to review at your convenience later.  YOU SHOULD EXPECT: Some feelings of bloating in the abdomen. Passage of more gas than usual.  Walking can help get rid of the air that was put into your GI tract during the procedure and reduce the bloating. If you had a lower endoscopy (such as a colonoscopy or flexible sigmoidoscopy) you may notice spotting of blood in your stool or on the toilet paper. If you underwent a bowel prep for your procedure, then you may not have a normal bowel movement for a few days.  DIET: Your first meal following the procedure should be a light meal and then it is ok to progress to your normal diet.  A half-sandwich or bowl of soup is an example of a good first meal.  Heavy or fried foods are harder to digest and may make you feel nauseous or bloated.  Likewise meals heavy in dairy and vegetables can cause extra gas to form and this can also increase the bloating.  Drink plenty of fluids but you should avoid alcoholic beverages for 24 hours.  ACTIVITY: Your care partner should take you home directly after the procedure.  You should plan to take it easy, moving slowly for the rest of the day.  You can resume normal activity the day after  the procedure however you should NOT DRIVE or use heavy machinery for 24 hours (because of the sedation medicines used during the test).    SYMPTOMS TO REPORT IMMEDIATELY: A gastroenterologist can be reached at any hour.  During normal business hours, 8:30 AM to 5:00 PM Monday through Friday, call [phone].  After hours and on weekends, please call the GI answering service at [phone] who will take a message and have the physician on call contact you.     Following upper endoscopy (EGD)  Vomiting of blood or coffee ground material  New chest pain or pain under the shoulder blades  Painful or persistently difficult swallowing  New shortness of breath  Fever of 100F or higher  Black, tarry-looking stools  FOLLOW UP: If any biopsies were taken you will be contacted by phone or by letter within the next 1-3 weeks.  Call your gastroenterologist if you have not heard about the biopsies in 3 weeks.  Our staff will call the home number listed on your records the next business day following your procedure to check on you and address any questions or concerns that you may have at that time regarding the information given to you following your procedure. This is a courtesy call and so if there is no answer at the home number and we have not heard from you through the emergency physician on  call, we will assume that you have returned to your regular daily activities without incident.  SIGNATURES/CONFIDENTIALITY: You and/or your care partner have signed paperwork which will be entered into your electronic medical record.  These signatures attest to the fact that that the information above on your After Visit Summary has been reviewed and is understood.  Full responsibility of the confidentiality of this discharge information lies with you and/or your care-partner.   INFO. ON GASTRITIS GIVEN TO YOU.

## 2011-08-23 NOTE — Progress Notes (Signed)
Patient did not experience any of the following events: a burn prior to discharge; a fall within the facility; wrong site/side/patient/procedure/implant event; or a hospital transfer or hospital admission upon discharge from the facility. (G8907) Patient did not have preoperative order for IV antibiotic SSI prophylaxis. (G8918)  

## 2011-08-24 ENCOUNTER — Encounter: Payer: Self-pay | Admitting: *Deleted

## 2011-08-24 ENCOUNTER — Telehealth: Payer: Self-pay | Admitting: *Deleted

## 2011-08-24 NOTE — Telephone Encounter (Signed)
  Follow up Call-  Call back number 08/23/2011 08/03/2011  Post procedure Call Back phone  # [phone], [phone] Daughter [name]  Permission to leave phone message Yes -     Patient questions:  Do you have a fever, pain , or abdominal swelling? no Pain Score  0 *  Have you tolerated food without any problems? yes  Have you been able to return to your normal activities? yes  Do you have any questions about your discharge instructions: Diet   no Medications  no Follow up visit  no  Do you have questions or concerns about your Care? no  Actions: * If pain score is 4 or above: No action needed, pain <4.

## 2012-01-11 ENCOUNTER — Other Ambulatory Visit: Payer: Self-pay | Admitting: Family Medicine

## 2012-01-11 DIAGNOSIS — I700 Atherosclerosis of aorta: Secondary | ICD-10-CM

## 2012-01-16 ENCOUNTER — Other Ambulatory Visit: Payer: Medicare Other

## 2012-01-17 ENCOUNTER — Ambulatory Visit
Admission: RE | Admit: 2012-01-17 | Discharge: 2012-01-17 | Disposition: A | Discharge status: Home / Self Care | Payer: Medicare Other | Source: Ambulatory Visit | Attending: Family Medicine | Admitting: Family Medicine

## 2012-01-17 DIAGNOSIS — I700 Atherosclerosis of aorta: Secondary | ICD-10-CM

## 2012-01-25 ENCOUNTER — Ambulatory Visit: Payer: Medicare Other | Attending: Family Medicine | Admitting: Physical Therapy

## 2012-01-25 DIAGNOSIS — R5381 Other malaise: Secondary | ICD-10-CM | POA: Insufficient documentation

## 2012-01-25 DIAGNOSIS — M545 Low back pain, unspecified: Secondary | ICD-10-CM | POA: Insufficient documentation

## 2012-01-25 DIAGNOSIS — IMO0001 Reserved for inherently not codable concepts without codable children: Secondary | ICD-10-CM | POA: Insufficient documentation

## 2012-01-31 ENCOUNTER — Ambulatory Visit: Payer: Medicare Other | Attending: Family Medicine | Admitting: Physical Therapy

## 2012-01-31 DIAGNOSIS — M545 Low back pain, unspecified: Secondary | ICD-10-CM | POA: Insufficient documentation

## 2012-01-31 DIAGNOSIS — R5381 Other malaise: Secondary | ICD-10-CM | POA: Insufficient documentation

## 2012-01-31 DIAGNOSIS — IMO0001 Reserved for inherently not codable concepts without codable children: Secondary | ICD-10-CM | POA: Insufficient documentation

## 2012-02-01 ENCOUNTER — Encounter: Payer: Medicare Other | Admitting: Physical Therapy

## 2012-03-11 ENCOUNTER — Other Ambulatory Visit: Payer: Self-pay | Admitting: Family Medicine

## 2012-03-11 DIAGNOSIS — R911 Solitary pulmonary nodule: Secondary | ICD-10-CM

## 2012-03-12 ENCOUNTER — Ambulatory Visit (HOSPITAL_COMMUNITY)
Admission: RE | Admit: 2012-03-12 | Discharge: 2012-03-12 | Disposition: A | Discharge status: Home / Self Care | Payer: Medicare Other | Source: Ambulatory Visit | Attending: Family Medicine | Admitting: Family Medicine

## 2012-03-12 DIAGNOSIS — R911 Solitary pulmonary nodule: Secondary | ICD-10-CM

## 2012-03-12 DIAGNOSIS — J984 Other disorders of lung: Secondary | ICD-10-CM | POA: Insufficient documentation

## 2012-08-21 ENCOUNTER — Other Ambulatory Visit: Payer: Self-pay | Admitting: Family Medicine

## 2012-08-22 ENCOUNTER — Other Ambulatory Visit: Payer: Self-pay | Admitting: Family Medicine

## 2012-08-23 ENCOUNTER — Other Ambulatory Visit: Payer: Self-pay | Admitting: Family Medicine

## 2012-08-23 MED ORDER — SIMVASTATIN 20 MG PO TABS: 20.0000 mg | ORAL_TABLET | Freq: Every day | ORAL | Status: DC

## 2012-08-23 NOTE — Telephone Encounter (Signed)
Rx refilled through [facility]

## 2012-08-28 ENCOUNTER — Other Ambulatory Visit: Payer: Self-pay | Admitting: Family Medicine

## 2012-08-28 DIAGNOSIS — R911 Solitary pulmonary nodule: Secondary | ICD-10-CM

## 2012-08-29 ENCOUNTER — Other Ambulatory Visit: Payer: Self-pay | Admitting: *Deleted

## 2012-08-29 ENCOUNTER — Other Ambulatory Visit: Payer: Self-pay | Admitting: Family Medicine

## 2012-08-29 MED ORDER — TRAMADOL HCL 50 MG PO TABS: 50.0000 mg | ORAL_TABLET | Freq: Four times a day (QID) | ORAL | Status: DC | PRN

## 2012-08-29 MED ORDER — OMEPRAZOLE 20 MG PO CPDR: 20.0000 mg | DELAYED_RELEASE_CAPSULE | Freq: Every day | ORAL | Status: DC

## 2012-08-29 NOTE — Telephone Encounter (Signed)
Last filled 07/30/12, last seen 05/30/12

## 2012-08-29 NOTE — Telephone Encounter (Signed)
Rx in Epic. May have gone to Pharmacy

## 2012-09-02 ENCOUNTER — Telehealth: Payer: Self-pay | Admitting: Family Medicine

## 2012-09-02 NOTE — Telephone Encounter (Signed)
Pt aware to be fasting for labs.

## 2012-09-03 ENCOUNTER — Ambulatory Visit: Payer: Medicare Other

## 2012-09-03 ENCOUNTER — Encounter: Payer: Self-pay | Admitting: Family Medicine

## 2012-09-03 ENCOUNTER — Other Ambulatory Visit: Payer: Self-pay | Admitting: *Deleted

## 2012-09-03 ENCOUNTER — Ambulatory Visit (INDEPENDENT_AMBULATORY_CARE_PROVIDER_SITE_OTHER): Payer: Medicare Other | Admitting: Family Medicine

## 2012-09-03 VITALS — BP 125/66 | HR 92 | Temp 97.6°F | Ht 65.0 in | Wt 192.2 lb

## 2012-09-03 DIAGNOSIS — R5381 Other malaise: Secondary | ICD-10-CM

## 2012-09-03 DIAGNOSIS — IMO0001 Reserved for inherently not codable concepts without codable children: Secondary | ICD-10-CM

## 2012-09-03 DIAGNOSIS — I10 Essential (primary) hypertension: Secondary | ICD-10-CM

## 2012-09-03 DIAGNOSIS — H9193 Unspecified hearing loss, bilateral: Secondary | ICD-10-CM

## 2012-09-03 DIAGNOSIS — I351 Nonrheumatic aortic (valve) insufficiency: Secondary | ICD-10-CM

## 2012-09-03 DIAGNOSIS — I2510 Atherosclerotic heart disease of native coronary artery without angina pectoris: Secondary | ICD-10-CM

## 2012-09-03 DIAGNOSIS — G4700 Insomnia, unspecified: Secondary | ICD-10-CM

## 2012-09-03 DIAGNOSIS — M129 Arthropathy, unspecified: Secondary | ICD-10-CM

## 2012-09-03 DIAGNOSIS — E559 Vitamin D deficiency, unspecified: Secondary | ICD-10-CM | POA: Insufficient documentation

## 2012-09-03 DIAGNOSIS — E785 Hyperlipidemia, unspecified: Secondary | ICD-10-CM

## 2012-09-03 DIAGNOSIS — R911 Solitary pulmonary nodule: Secondary | ICD-10-CM

## 2012-09-03 DIAGNOSIS — H9190 Unspecified hearing loss, unspecified ear: Secondary | ICD-10-CM

## 2012-09-03 DIAGNOSIS — G459 Transient cerebral ischemic attack, unspecified: Secondary | ICD-10-CM

## 2012-09-03 DIAGNOSIS — M1990 Unspecified osteoarthritis, unspecified site: Secondary | ICD-10-CM | POA: Insufficient documentation

## 2012-09-03 DIAGNOSIS — D509 Iron deficiency anemia, unspecified: Secondary | ICD-10-CM

## 2012-09-03 DIAGNOSIS — I359 Nonrheumatic aortic valve disorder, unspecified: Secondary | ICD-10-CM

## 2012-09-03 LAB — POCT CBC
Granulocyte percent: 49.6 %G (ref 37–80)
HCT, POC: 35.7 % — AB (ref 43.5–53.7)
Hemoglobin: 11.7 g/dL — AB (ref 14.1–18.1)
Lymph, poc: 2.7 (ref 0.6–3.4)
MCH, POC: 24.6 pg — AB (ref 27–31.2)
MCHC: 33 g/dL (ref 31.8–35.4)
MCV: 74.6 fL — AB (ref 80–97)
MPV: 6.6 fL (ref 0–99.8)
POC Granulocyte: 3.3 (ref 2–6.9)
POC LYMPH PERCENT: 40.9 %L (ref 10–50)
Platelet Count, POC: 228 10*3/uL (ref 142–424)
RBC: 4.8 M/uL (ref 4.69–6.13)
RDW, POC: 16.4 %
WBC: 6.6 10*3/uL (ref 4.6–10.2)

## 2012-09-03 LAB — BASIC METABOLIC PANEL WITH GFR
BUN: 13 mg/dL (ref 6–23)
CO2: 28 mEq/L (ref 19–32)
Calcium: 9.3 mg/dL (ref 8.4–10.5)
Chloride: 104 mEq/L (ref 96–112)
Creat: 1.33 mg/dL (ref 0.50–1.35)
GFR, Est African American: 56 mL/min — ABNORMAL LOW
GFR, Est Non African American: 48 mL/min — ABNORMAL LOW
Glucose, Bld: 106 mg/dL — ABNORMAL HIGH (ref 70–99)
Potassium: 4 mEq/L (ref 3.5–5.3)
Sodium: 140 mEq/L (ref 135–145)

## 2012-09-03 LAB — HEPATIC FUNCTION PANEL
ALT: 12 U/L (ref 0–53)
AST: 16 U/L (ref 0–37)
Albumin: 4 g/dL (ref 3.5–5.2)
Alkaline Phosphatase: 64 U/L (ref 39–117)
Bilirubin, Direct: 0.1 mg/dL (ref 0.0–0.3)
Indirect Bilirubin: 0.3 mg/dL (ref 0.0–0.9)
Total Bilirubin: 0.4 mg/dL (ref 0.3–1.2)
Total Protein: 6.6 g/dL (ref 6.0–8.3)

## 2012-09-03 LAB — POCT GLYCOSYLATED HEMOGLOBIN (HGB A1C): Hemoglobin A1C: 5.9

## 2012-09-03 NOTE — Assessment & Plan Note (Signed)
stable °

## 2012-09-03 NOTE — Assessment & Plan Note (Signed)
Doing well. Usually CBG 100s, last this am was 118. No change in meds.

## 2012-09-03 NOTE — Assessment & Plan Note (Addendum)
Stable. None occurred for a long time.

## 2012-09-03 NOTE — Assessment & Plan Note (Signed)
No symptoms; stable 

## 2012-09-03 NOTE — Assessment & Plan Note (Signed)
Has hearing aids.

## 2012-09-03 NOTE — Assessment & Plan Note (Signed)
No change 

## 2012-09-03 NOTE — Assessment & Plan Note (Signed)
Neck and lower spine discomfort. Stable. Chronic.

## 2012-09-03 NOTE — Progress Notes (Signed)
Patient ID: JOSIAH NIETO, male   DOB: 12-01-1924, 79 y.o.   MRN: [id number] SUBJECTIVE:  HPI: Patient is here for follow up of Diabetes Mellitus.Symptoms of DM:has had no Nocturia ,deniesUrinary Frequency ,denies Blurred vision ,deniesDizziness,denies.Dysuria,deniesparesthesias, deniesextremity pain or ulcers. deniesErectile Dysfunction.denieschest pain. .has not hadan annual eye exam. do not check the feet. doescheck CBGs. Average CBG:________.Marland Kitchen deniesto episodes of hypoglycemia. doeshave an emergency hypoglycemic plan. admits toCompliance with medications. deniesProblems with medications.    PMH/PSH: reviewed/updated in Epic  SH/FH: reviewed/updated in Epic  Allergies: reviewed/updated in Epic  Medications: reviewed/updated in Epic  Immunizations: reviewed/updated in Epic   ROS: As above in the HPI. All other systems are stable or negative.   OBJECTIVE: APPEARANCE: obese Patient in no acute distress.The patient appeared well nourished and normally developed. Acyanotic.  Waist:  VITAL SIGNS:BP 125/66  Pulse 92  Temp(Src) 97.6 F (36.4 C) (Oral)  Ht 5\' 5"  (1.651 m)  Wt 192 lb 3.2 oz (87.181 kg)  BMI 31.98 kg/m2   SKIN: warm and  Dry without overt rashes, tattoos and scars  HEAD and Neck: without JVD, Normal No scleral icterus  CHEST & LUNGS: Clear  CVS: Reveals the PMI to be normally located. Regular rhythm, First and Second Heart sounds are normal, and absence of murmurs, rubs or gallops.  ABDOMEN:  Benign,, no organomegaly, no masses, no Abdominal Aortic enlargement. No Guarding , no rebound. No Bruits.protuberant abdomen.ventral hernia.  RECTAL:n/a  GU:n/a  EXTREMETIES: nonedematous. Both Femoral and Pedal pulses are normal.  MUSCULOSKELETAL:  Spine: decreased ROM   NEUROLOGIC: oriented to place and person; nonfocal.   ASSESSMENT: DIAB W/O MENTION COMP TYPE II/UNS TYPE UNCNTRL Doing well. Usually CBG 100s, last this am was 118. No  change in meds.  TIA Stable. None occurred for a long time.  CORONARY ATHEROSCLEROSIS NATIVE CORONARY ARTERY stable  HYPERTENSION No symptoms.  Arthritis Neck and lower spine discomfort. Stable. Chronic.  Unspecified vitamin D deficiency No change  Aortic insufficiency stable  Other and unspecified hyperlipidemia No changes.  Insomnia Chronic now.  Hard of hearing Has hearing aids  Iron deficiency anemia, unspecified No symptoms. stable   PLAN:  Orders Placed This Encounter  Procedures  . BASIC METABOLIC PANEL WITH GFR  . Hepatic function panel  . NMR Lipoprofile with Lipids  . Vitamin D 25 hydroxy  . POCT glycosylated hemoglobin (Hb A1C)  . POCT CBC   Results for orders placed in visit on 09/03/12 (from the past 24 hour(s))  POCT CBC     Status: Abnormal   Collection Time    09/03/12 11:49 AM      Result Value Range   WBC 6.6  4.6 - 10.2 K/uL   Lymph, poc 2.7  0.6 - 3.4   POC LYMPH PERCENT 40.9  10 - 50 %L   POC Granulocyte 3.3  2 - 6.9   Granulocyte percent 49.6  37 - 80 %G   RBC 4.8  4.69 - 6.13 M/uL   Hemoglobin 11.7 (*) 14.1 - 18.1 g/dL   HCT, POC 40.9 (*) 81.1 - 53.7 %   MCV 74.6 (*) 80 - 97 fL   MCH, POC 24.6 (*) 27 - 31.2 pg   MCHC 33.0  31.8 - 35.4 g/dL   RDW, POC 91.4     Platelet Count, POC 228.0  142 - 424 K/uL   MPV 6.6  0 - 99.8 fL  POCT GLYCOSYLATED HEMOGLOBIN (HGB A1C)     Status: None   Collection Time  09/03/12 11:56 AM      Result Value Range   Hemoglobin A1C 5.9      Meds ordered this encounter  Medications  . clobetasol cream (TEMOVATE) 0.05 %    Sig:   . ketoconazole (NIZORAL) 2 % cream    Sig:   . triamcinolone cream (KENALOG) 0.1 %    Sig:   . glucose blood test strip    Sig:   . Lancets (FREESTYLE) lancets    Sig:   . sitaGLIPtin (JANUVIA) 50 MG tablet    Sig: Take 50 mg by mouth daily.  . Melatonin 3 MG TABS    Sig: Take 3 mg by mouth at bedtime as needed.  continue present level of care.  RTC in 3  months.  Jermario Kalmar P. Modesto Charon, M.D.

## 2012-09-03 NOTE — Assessment & Plan Note (Signed)
No changes

## 2012-09-03 NOTE — Assessment & Plan Note (Signed)
No symptoms 

## 2012-09-03 NOTE — Assessment & Plan Note (Signed)
Chronic now.

## 2012-09-04 LAB — NMR LIPOPROFILE WITH LIPIDS
Cholesterol, Total: 118 mg/dL (ref <200)
HDL Particle Number: 22.6 umol/L — ABNORMAL LOW (ref >=30.5)
HDL Size: 8.8 nm — ABNORMAL LOW (ref >=9.2)
HDL-C: 30 mg/dL — ABNORMAL LOW (ref >=40)
LDL (calc): 68 mg/dL (ref <100)
LDL Particle Number: 897 nmol/L (ref <1000)
LDL Size: 19.7 nm — ABNORMAL LOW (ref >20.5)
LP-IR Score: 45 (ref <=45)
Large HDL-P: 2 umol/L — ABNORMAL LOW (ref >=4.8)
Large VLDL-P: <0.8 nmol/L (ref <=2.7)
Small LDL Particle Number: 708 nmol/L — ABNORMAL HIGH (ref <=527)
Triglycerides: 99 mg/dL (ref <150)
VLDL Size: 47.8 nm — ABNORMAL HIGH (ref <=46.6)

## 2012-09-04 LAB — VITAMIN D 25 HYDROXY (VIT D DEFICIENCY, FRACTURES): Vit D, 25-Hydroxy: 37 ng/mL (ref 30–89)

## 2012-09-06 ENCOUNTER — Telehealth: Payer: Self-pay | Admitting: Family Medicine

## 2012-09-06 NOTE — Progress Notes (Signed)
Quick Note:  Lab result at goal. No change in Medications for now. No Change in plans and follow up. ______ 

## 2012-09-09 ENCOUNTER — Other Ambulatory Visit: Payer: Self-pay

## 2012-09-09 ENCOUNTER — Encounter: Payer: Self-pay | Admitting: *Deleted

## 2012-09-09 DIAGNOSIS — R911 Solitary pulmonary nodule: Secondary | ICD-10-CM

## 2012-09-09 NOTE — Telephone Encounter (Signed)
Spoke with wife and per dr Modesto Charon will see pt in 3 months and do CXR . Pt agreed and verbalized understanding.

## 2012-09-10 ENCOUNTER — Ambulatory Visit (HOSPITAL_COMMUNITY)
Admission: RE | Admit: 2012-09-10 | Discharge: 2012-09-10 | Disposition: A | Discharge status: Home / Self Care | Payer: Medicare Other | Source: Ambulatory Visit | Attending: Family Medicine | Admitting: Family Medicine

## 2012-09-10 DIAGNOSIS — R911 Solitary pulmonary nodule: Secondary | ICD-10-CM

## 2012-09-10 DIAGNOSIS — Z09 Encounter for follow-up examination after completed treatment for conditions other than malignant neoplasm: Secondary | ICD-10-CM | POA: Insufficient documentation

## 2012-09-25 ENCOUNTER — Other Ambulatory Visit: Payer: Self-pay | Admitting: *Deleted

## 2012-09-25 MED ORDER — LEVOTHYROXINE SODIUM 75 MCG PO TABS: 75.0000 ug | ORAL_TABLET | Freq: Every day | ORAL | Status: DC

## 2012-10-15 ENCOUNTER — Other Ambulatory Visit: Payer: Self-pay | Admitting: Family Medicine

## 2012-10-15 NOTE — Telephone Encounter (Signed)
Last seen 09/03/12   Last filled 08/29/12   If approved print and have nurse call patient to pick up

## 2012-10-23 ENCOUNTER — Other Ambulatory Visit: Payer: Self-pay | Admitting: Family Medicine

## 2012-10-28 ENCOUNTER — Other Ambulatory Visit: Payer: Self-pay | Admitting: Family Medicine

## 2012-10-29 NOTE — Telephone Encounter (Signed)
Done 10/15/12, sent note to FPW and nurse to check

## 2012-10-29 NOTE — Telephone Encounter (Signed)
[facility] continues to request Tramadol for this patient was it done?

## 2012-10-30 ENCOUNTER — Other Ambulatory Visit: Payer: Self-pay | Admitting: Family Medicine

## 2012-10-30 MED ORDER — TRAMADOL HCL 50 MG PO TABS: 50.0000 mg | ORAL_TABLET | Freq: Three times a day (TID) | ORAL | Status: DC | PRN

## 2012-10-30 NOTE — Telephone Encounter (Signed)
Done by FPW

## 2012-10-30 NOTE — Telephone Encounter (Signed)
done

## 2012-11-11 ENCOUNTER — Ambulatory Visit (INDEPENDENT_AMBULATORY_CARE_PROVIDER_SITE_OTHER): Payer: Medicare Other | Admitting: Physician Assistant

## 2012-11-11 ENCOUNTER — Telehealth: Payer: Self-pay | Admitting: Family Medicine

## 2012-11-11 ENCOUNTER — Encounter: Payer: Self-pay | Admitting: Physician Assistant

## 2012-11-11 VITALS — BP 113/62 | HR 71 | Temp 97.1°F | Wt 196.0 lb

## 2012-11-11 DIAGNOSIS — S139XXA Sprain of joints and ligaments of unspecified parts of neck, initial encounter: Secondary | ICD-10-CM

## 2012-11-11 DIAGNOSIS — E119 Type 2 diabetes mellitus without complications: Secondary | ICD-10-CM

## 2012-11-11 DIAGNOSIS — S161XXA Strain of muscle, fascia and tendon at neck level, initial encounter: Secondary | ICD-10-CM

## 2012-11-11 MED ORDER — SITAGLIPTIN PHOSPHATE 25 MG PO TABS: 25.0000 mg | ORAL_TABLET | Freq: Every day | ORAL | Status: DC

## 2012-11-11 NOTE — Telephone Encounter (Signed)
appt made

## 2012-11-11 NOTE — Progress Notes (Signed)
Subjective:     Patient ID: [name], male   DOB: 02/01/1925, 79 y.o.   MRN: [id number]  HPI Pt with L post neck pain for several months but got worse this weekend after working in the garden Sx radiate up the scalp No radiation to the L arm/hand Denies direct trauma Family states he will often fall asleep in the chair with his head in a forward flexion position No OTC meds tried Ice has helped sx  Review of Systems  All other systems reviewed and are negative.       Objective:   Physical Exam  Nursing note and vitals reviewed.  Good grip strength Pulses/sensory good FROM elbow/wrist w/o sx No TTP shoulder FROM shoulder w/o sx + TTP along the L trap Shoulder shrug equal but reproduces sx FROM C-spine- sx with rotation and full flexion    Assessment:     Trap strain    Plan:     Heat/Ice Pt already has pain med Minimize work in the garden for now F/U prn

## 2012-11-11 NOTE — Patient Instructions (Signed)
Muscle Strain  Muscle strain occurs when a muscle is stretched beyond its normal length. A small number of muscle fibers generally are torn. This is especially common in athletes. This happens when a sudden, violent force placed on a muscle stretches it too far. Usually, recovery from muscle strain takes 1 to 2 weeks. Complete healing will take 5 to 6 weeks.   HOME CARE INSTRUCTIONS    While awake, apply ice to the sore muscle for the first 2 days after the injury.   Put ice in a plastic bag.   Place a towel between your skin and the bag.   Leave the ice on for 15-20 minutes each hour.   Do not use the strained muscle for several days, until you no longer have pain.   You may wrap the injured area with an elastic bandage for comfort. Be careful not to wrap it too tightly. This may interfere with blood circulation or increase swelling.   Only take over-the-counter or prescription medicines for pain, discomfort, or fever as directed by your caregiver.  SEEK MEDICAL CARE IF:   You have increasing pain or swelling in the injured area.  MAKE SURE YOU:    Understand these instructions.   Will watch your condition.   Will get help right away if you are not doing well or get worse.  Document Released: 05/15/2005 Document Revised: 08/07/2011 Document Reviewed: 05/27/2011  [employer] Patient Information 2014 [employer].

## 2012-11-12 ENCOUNTER — Telehealth: Payer: Self-pay | Admitting: *Deleted

## 2012-11-12 MED ORDER — SITAGLIPTIN PHOSPHATE 50 MG PO TABS: 50.0000 mg | ORAL_TABLET | Freq: Every day | ORAL | Status: DC

## 2012-11-12 NOTE — Telephone Encounter (Signed)
Patient requested refill of Januvia at appt yesterday.  Wife stated that he takes a half a tablet and that his tablets were 50mg . She didn't want to cut them anymore.  Patient is actually taking half of 100mg  tablet.  Corrected at pharmacy.

## 2012-11-21 ENCOUNTER — Other Ambulatory Visit: Payer: Self-pay | Admitting: Family Medicine

## 2012-11-22 NOTE — Telephone Encounter (Signed)
Last seen for chronic follow up on 4-10 with FPW. Acute visit with [facility] this month. Please advise. Didn't know if meds should be refilled or if patient needed to be seen again

## 2012-11-25 ENCOUNTER — Other Ambulatory Visit: Payer: Self-pay | Admitting: Family Medicine

## 2012-11-25 MED ORDER — TRIAMCINOLONE ACETONIDE 0.1 % EX CREA: TOPICAL_CREAM | Freq: Two times a day (BID) | CUTANEOUS | Status: DC

## 2012-11-25 MED ORDER — SIMVASTATIN 20 MG PO TABS: 20.0000 mg | ORAL_TABLET | Freq: Every day | ORAL | Status: DC

## 2012-11-25 MED ORDER — KETOCONAZOLE 2 % EX CREA: TOPICAL_CREAM | Freq: Two times a day (BID) | CUTANEOUS | Status: DC

## 2012-12-04 ENCOUNTER — Ambulatory Visit (INDEPENDENT_AMBULATORY_CARE_PROVIDER_SITE_OTHER): Payer: Medicare Other | Admitting: Family Medicine

## 2012-12-04 ENCOUNTER — Encounter: Payer: Self-pay | Admitting: Family Medicine

## 2012-12-04 VITALS — BP 129/58 | HR 56 | Temp 97.3°F | Ht 65.0 in | Wt 191.0 lb

## 2012-12-04 DIAGNOSIS — M1990 Unspecified osteoarthritis, unspecified site: Secondary | ICD-10-CM

## 2012-12-04 DIAGNOSIS — I252 Old myocardial infarction: Secondary | ICD-10-CM

## 2012-12-04 DIAGNOSIS — Z23 Encounter for immunization: Secondary | ICD-10-CM

## 2012-12-04 DIAGNOSIS — G459 Transient cerebral ischemic attack, unspecified: Secondary | ICD-10-CM

## 2012-12-04 DIAGNOSIS — IMO0001 Reserved for inherently not codable concepts without codable children: Secondary | ICD-10-CM

## 2012-12-04 DIAGNOSIS — M549 Dorsalgia, unspecified: Secondary | ICD-10-CM

## 2012-12-04 DIAGNOSIS — M129 Arthropathy, unspecified: Secondary | ICD-10-CM

## 2012-12-04 DIAGNOSIS — I351 Nonrheumatic aortic (valve) insufficiency: Secondary | ICD-10-CM

## 2012-12-04 DIAGNOSIS — G8929 Other chronic pain: Secondary | ICD-10-CM | POA: Insufficient documentation

## 2012-12-04 DIAGNOSIS — H9190 Unspecified hearing loss, unspecified ear: Secondary | ICD-10-CM

## 2012-12-04 DIAGNOSIS — E559 Vitamin D deficiency, unspecified: Secondary | ICD-10-CM

## 2012-12-04 DIAGNOSIS — Z2911 Encounter for prophylactic immunotherapy for respiratory syncytial virus (RSV): Secondary | ICD-10-CM

## 2012-12-04 DIAGNOSIS — I2510 Atherosclerotic heart disease of native coronary artery without angina pectoris: Secondary | ICD-10-CM

## 2012-12-04 DIAGNOSIS — I10 Essential (primary) hypertension: Secondary | ICD-10-CM

## 2012-12-04 DIAGNOSIS — I359 Nonrheumatic aortic valve disorder, unspecified: Secondary | ICD-10-CM

## 2012-12-04 DIAGNOSIS — G4700 Insomnia, unspecified: Secondary | ICD-10-CM

## 2012-12-04 DIAGNOSIS — D509 Iron deficiency anemia, unspecified: Secondary | ICD-10-CM

## 2012-12-04 DIAGNOSIS — E785 Hyperlipidemia, unspecified: Secondary | ICD-10-CM

## 2012-12-04 DIAGNOSIS — E039 Hypothyroidism, unspecified: Secondary | ICD-10-CM | POA: Insufficient documentation

## 2012-12-04 LAB — COMPLETE METABOLIC PANEL WITH GFR
ALT: 9 U/L (ref 0–53)
AST: 13 U/L (ref 0–37)
Albumin: 4 g/dL (ref 3.5–5.2)
Alkaline Phosphatase: 71 U/L (ref 39–117)
BUN: 15 mg/dL (ref 6–23)
CO2: 28 mEq/L (ref 19–32)
Calcium: 8.9 mg/dL (ref 8.4–10.5)
Chloride: 104 mEq/L (ref 96–112)
Creat: 1.32 mg/dL (ref 0.50–1.35)
GFR, Est African American: 56 mL/min — ABNORMAL LOW
GFR, Est Non African American: 49 mL/min — ABNORMAL LOW
Glucose, Bld: 116 mg/dL — ABNORMAL HIGH (ref 70–99)
Potassium: 4.4 mEq/L (ref 3.5–5.3)
Sodium: 141 mEq/L (ref 135–145)
Total Bilirubin: 0.5 mg/dL (ref 0.3–1.2)
Total Protein: 6.7 g/dL (ref 6.0–8.3)

## 2012-12-04 LAB — TSH: TSH: 2.495 u[IU]/mL (ref 0.350–4.500)

## 2012-12-04 LAB — POCT GLYCOSYLATED HEMOGLOBIN (HGB A1C): Hemoglobin A1C: 6

## 2012-12-04 MED ORDER — AMLODIPINE BESY-BENAZEPRIL HCL 5-20 MG PO CAPS: 1.0000 | ORAL_CAPSULE | Freq: Every day | ORAL | Status: DC

## 2012-12-04 MED ORDER — SIMVASTATIN 20 MG PO TABS: 20.0000 mg | ORAL_TABLET | Freq: Every day | ORAL | Status: DC

## 2012-12-04 MED ORDER — SITAGLIPTIN PHOSPHATE 50 MG PO TABS: 50.0000 mg | ORAL_TABLET | Freq: Every day | ORAL | Status: DC

## 2012-12-04 MED ORDER — TRAMADOL HCL 50 MG PO TABS: 50.0000 mg | ORAL_TABLET | Freq: Three times a day (TID) | ORAL | Status: DC | PRN

## 2012-12-04 MED ORDER — FUROSEMIDE 40 MG PO TABS: 40.0000 mg | ORAL_TABLET | Freq: Every day | ORAL | Status: DC

## 2012-12-04 MED ORDER — METOPROLOL SUCCINATE ER 50 MG PO TB24: 50.0000 mg | ORAL_TABLET | Freq: Every day | ORAL | Status: DC

## 2012-12-04 MED ORDER — LEVOTHYROXINE SODIUM 75 MCG PO TABS: 75.0000 ug | ORAL_TABLET | Freq: Every day | ORAL | Status: DC

## 2012-12-04 NOTE — Patient Instructions (Addendum)
Tetanus, Diphtheria, Pertussis (Tdap) Vaccine What You Need to Know WHY GET VACCINATED? Tetanus, diphtheria and pertussis can be very serious diseases, even for adolescents and adults. Tdap vaccine can protect us from these diseases. TETANUS (Lockjaw) causes painful muscle tightening and stiffness, usually all over the body.  It can lead to tightening of muscles in the head and neck so you can't open your mouth, swallow, or sometimes even breathe. Tetanus kills about 1 out of 5 people who are infected. DIPHTHERIA can cause a thick coating to form in the back of the throat.  It can lead to breathing problems, paralysis, heart failure, and death. PERTUSSIS (Whooping Cough) causes severe coughing spells, which can cause difficulty breathing, vomiting and disturbed sleep.  It can also lead to weight loss, incontinence, and rib fractures. Up to 2 in 100 adolescents and 5 in 100 adults with pertussis are hospitalized or have complications, which could include pneumonia and death. These diseases are caused by bacteria. Diphtheria and pertussis are spread from person to person through coughing or sneezing. Tetanus enters the body through cuts, scratches, or wounds. Before vaccines, the [place] saw as many as 200,000 cases a year of diphtheria and pertussis, and hundreds of cases of tetanus. Since vaccination began, tetanus and diphtheria have dropped by about 99% and pertussis by about 80%. TDAP VACCINE Tdap vaccine can protect adolescents and adults from tetanus, diphtheria, and pertussis. One dose of Tdap is routinely given at age 11 or 12. People who did not get Tdap at that age should get it as soon as possible. Tdap is especially important for health care professionals and anyone having close contact with a baby younger than 12 months. Pregnant women should get a dose of Tdap during every pregnancy, to protect the newborn from pertussis. Infants are most at risk for severe, life-threatening  complications from pertussis. A similar vaccine, called Td, protects from tetanus and diphtheria, but not pertussis. A Td booster should be given every 10 years. Tdap may be given as one of these boosters if you have not already gotten a dose. Tdap may also be given after a severe cut or burn to prevent tetanus infection. Your doctor can give you more information. Tdap may safely be given at the same time as other vaccines. SOME PEOPLE SHOULD NOT GET THIS VACCINE  If you ever had a life-threatening allergic reaction after a dose of any tetanus, diphtheria, or pertussis containing vaccine, OR if you have a severe allergy to any part of this vaccine, you should not get Tdap. Tell your doctor if you have any severe allergies.  If you had a coma, or long or multiple seizures within 7 days after a childhood dose of DTP or DTaP, you should not get Tdap, unless a cause other than the vaccine was found. You can still get Td.  Talk to your doctor if you:  have epilepsy or another nervous system problem,  had severe pain or swelling after any vaccine containing diphtheria, tetanus or pertussis,  ever had Guillain-Barr Syndrome (GBS),  aren't feeling well on the day the shot is scheduled. RISKS OF A VACCINE REACTION With any medicine, including vaccines, there is a chance of side effects. These are usually mild and go away on their own, but serious reactions are also possible. Brief fainting spells can follow a vaccination, leading to injuries from falling. Sitting or lying down for about 15 minutes can help prevent these. Tell your doctor if you feel dizzy or light-headed, or   have vision changes or ringing in the ears. Mild problems following Tdap (Did not interfere with activities)  Pain where the shot was given (about 3 in 4 adolescents or 2 in 3 adults)  Redness or swelling where the shot was given (about 1 person in 5)  Mild fever of at least 100.4F (up to about 1 in 25 adolescents or 1 in  100 adults)  Headache (about 3 or 4 people in 10)  Tiredness (about 1 person in 3 or 4)  Nausea, vomiting, diarrhea, stomach ache (up to 1 in 4 adolescents or 1 in 10 adults)  Chills, body aches, sore joints, rash, swollen glands (uncommon) Moderate problems following Tdap (Interfered with activities, but did not require medical attention)  Pain where the shot was given (about 1 in 5 adolescents or 1 in 100 adults)  Redness or swelling where the shot was given (up to about 1 in 16 adolescents or 1 in 25 adults)  Fever over 102F (about 1 in 100 adolescents or 1 in 250 adults)  Headache (about 3 in 20 adolescents or 1 in 10 adults)  Nausea, vomiting, diarrhea, stomach ache (up to 1 or 3 people in 100)  Swelling of the entire arm where the shot was given (up to about 3 in 100). Severe problems following Tdap (Unable to perform usual activities, required medical attention)  Swelling, severe pain, bleeding and redness in the arm where the shot was given (rare). A severe allergic reaction could occur after any vaccine (estimated less than 1 in a million doses). WHAT IF THERE IS A SERIOUS REACTION? What should I look for?  Look for anything that concerns you, such as signs of a severe allergic reaction, very high fever, or behavior changes. Signs of a severe allergic reaction can include hives, swelling of the face and throat, difficulty breathing, a fast heartbeat, dizziness, and weakness. These would start a few minutes to a few hours after the vaccination. What should I do?  If you think it is a severe allergic reaction or other emergency that can't wait, call 9-1-1 or get the person to the nearest hospital. Otherwise, call your doctor.  Afterward, the reaction should be reported to the "Vaccine Adverse Event Reporting System" (VAERS). Your doctor might file this report, or you can do it yourself through the VAERS web site at www.vaers.hhs.gov, or by calling 1-800-822-7967. VAERS is  only for reporting reactions. They do not give medical advice.  THE NATIONAL VACCINE INJURY COMPENSATION PROGRAM The National Vaccine Injury Compensation Program (VICP) is a federal program that was created to compensate people who may have been injured by certain vaccines. Persons who believe they may have been injured by a vaccine can learn about the program and about filing a claim by calling 1-800-338-2382 or visiting the VICP website at www.hrsa.gov/vaccinecompensation. HOW CAN I LEARN MORE?  Ask your doctor.  Call your local or state health department.  Contact the Centers for Disease Control and Prevention (CDC):  Call 1-800-232-4636 or visit CDC's website at www.cdc.gov/vaccines. CDC Tdap Vaccine VIS (10/05/11) Document Released: 11/14/2011 Document Revised: 02/07/2012 Document Reviewed: 11/14/2011 ExitCare Patient Information 2014 [employer].  Herpes Zoster Virus Vaccine What is this medicine? HERPES ZOSTER VIRUS VACCINE (HUR peez ZOS ter vahy ruhs vak SEEN) is a vaccine. It is used to prevent shingles in adults 77 years old and over. This vaccine is not used to treat shingles or nerve pain from shingles. This medicine may be used for other purposes; ask your health   care provider or pharmacist if you have questions. What should I tell my health care provider before I take this medicine? They need to know if you have any of these conditions: -cancer like leukemia or lymphoma -immune system problems or therapy -infection with fever -tuberculosis -an unusual or allergic reaction to vaccines, neomycin, gelatin, other medicines, foods, dyes, or preservatives -pregnant or trying to get pregnant -breast-feeding How should I use this medicine? This vaccine is for injection under the skin. It is given by a health care professional. Talk to your pediatrician regarding the use of this medicine in children. This medicine is not approved for use in children. Overdosage: If you think  you have taken too much of this medicine contact a poison control center or emergency room at once. NOTE: This medicine is only for you. Do not share this medicine with others. What if I miss a dose? This does not apply. What may interact with this medicine? Do not take this medicine with any of the following medications: -adalimumab -anakinra -etanercept -infliximab -medicines to treat cancer -medicines that suppress your immune system This medicine may also interact with the following medications: -immunoglobulins -steroid medicines like prednisone or cortisone This list may not describe all possible interactions. Give your health care provider a list of all the medicines, herbs, non-prescription drugs, or dietary supplements you use. Also tell them if you smoke, drink alcohol, or use illegal drugs. Some items may interact with your medicine. What should I watch for while using this medicine? Visit your doctor for regular check ups. This vaccine, like all vaccines, may not fully protect everyone. After receiving this vaccine it may be possible to pass chickenpox infection to others. Avoid people with immune system problems, pregnant women who have not had chickenpox, and newborns of women who have not had chickenpox. Talk to your doctor for more information. What side effects may I notice from receiving this medicine? Side effects that you should report to your doctor or health care professional as soon as possible: -allergic reactions like skin rash, itching or hives, swelling of the face, lips, or tongue -breathing problems -feeling faint or lightheaded, falls -fever, flu-like symptoms -pain, tingling, numbness in the hands or feet -swelling of the ankles, feet, hands -unusually weak or tired Side effects that usually do not require medical attention (report to your doctor or health care professional if they continue or are bothersome): -aches or pains -chickenpox-like  rash -diarrhea -headache -loss of appetite -nausea, vomiting -redness, pain, swelling at site where injected -runny nose This list may not describe all possible side effects. Call your doctor for medical advice about side effects. You may report side effects to FDA at 1-800-FDA-1088. Where should I keep my medicine? This drug is given in a hospital or clinic and will not be stored at home. NOTE: This sheet is a summary. It may not cover all possible information. If you have questions about this medicine, talk to your doctor, pharmacist, or health care provider.  2013, [employer]vier/[employer] Standard. (11/01/2009 5:43:50 PM)  

## 2012-12-04 NOTE — Progress Notes (Signed)
Patient ID: [name], male   DOB: 07-Jun-1924, 79 y.o.   MRN: [id number] SUBJECTIVE: CC: Chief Complaint  Patient presents with  . Follow-up    f/u on chronic medical conditions    HPI: Patient is here for follow up of Diabetes Mellitus: Symptoms of DM: Denies Nocturia ,Denies Urinary Frequency , denies Blurred vision ,deniesDizziness,denies.Dysuria,denies paresthesias, denies extremity pain or ulcers.Craig Kitchendenies chest pain. has had an annual eye exam. do check the feet. Does check CBGs. Average CBG:130s Denies episodes of hypoglycemia. Does have an emergency hypoglycemic plan. admits toCompliance with medications. Denies Problems with medications.  Multiple other problems  Reviewed and no new symptoms.  Past Medical History  Diagnosis Date  . Diabetes mellitus     Diet control   . Hypertension   . History of pneumonia   . Hemorrhoids   . MI (myocardial infarction) 1996    percutaneous coronary intervention   . GERD (gastroesophageal reflux disease)   . Hypothyroidism   . Diverticulosis   . Aortic insufficiency   . Aortic valve sclerosis   . Dyslipidemia   . Hard of hearing   . Small bowel obstruction    Past Surgical History  Procedure Laterality Date  . Exploratory laparotomy w/ bowel resection      ventral hernia repair  . Transurethral resection of prostate    . Cholecystectomy    . Colonoscopy  2008  . Abdominal hernia repair      multiple sx's  . Cataract extraction      left eye   History   Social History  . Marital Status: Married    Spouse Name: [name]    Number of Children: [name]  . Years of Education: [name]   Occupational History  . Retired    Social History Main Topics  . Smoking status: Former Smoker    Quit date: 08/09/1942  . Smokeless tobacco: Never Used  . Alcohol Use: No  . Drug Use: No  . Sexually Active: Not on file   Other Topics Concern  . Not on file   Social History Narrative  . No narrative on file   Family History   Problem Relation Age of Onset  . Colon cancer Neg Hx    Current Outpatient Prescriptions on File Prior to Visit  Medication Sig Dispense Refill  . aspirin 81 MG tablet Take 81 mg by mouth daily.      . clobetasol cream (TEMOVATE) 0.05 %       . glucose blood test strip       . ketoconazole (NIZORAL) 2 % cream Apply topically 2 (two) times daily.  15 g  3  . Lancets (FREESTYLE) lancets       . Melatonin 3 MG TABS Take 3 mg by mouth at bedtime as needed.      Roxy Horseman Laxative 64.8-324 MG TABS Take 2 tablets by mouth at bedtime.       . Omega-3 Fatty Acids (FISH OIL) 1000 MG CAPS Take 1 capsule by mouth daily.      Craig Kitchen omeprazole (PRILOSEC) 20 MG capsule Take 1 capsule (20 mg total) by mouth daily.  30 capsule  3  . triamcinolone cream (KENALOG) 0.1 % Apply topically 2 (two) times daily.  30 g  3   No current facility-administered medications on file prior to visit.   No Known Allergies Immunization History  Administered Date(s) Administered  . Tdap 12/04/2012  . Zoster 12/04/2012   Prior to Admission medications  Medication Sig Start Date End Date Taking? Authorizing Provider  amLODipine-benazepril (LOTREL) 5-20 MG per capsule Take 1 capsule by mouth daily. 12/04/12  Yes Ileana Ladd, MD  aspirin 81 MG tablet Take 81 mg by mouth daily.   Yes Historical Provider, MD  clobetasol cream (TEMOVATE) 0.05 %  05/30/12  Yes Historical Provider, MD  furosemide (LASIX) 40 MG tablet Take 1 tablet (40 mg total) by mouth daily. 12/04/12  Yes Ileana Ladd, MD  glucose blood test strip  07/21/12  Yes Historical Provider, MD  ketoconazole (NIZORAL) 2 % cream Apply topically 2 (two) times daily. 11/25/12  Yes Deatra Canter, FNP  Lancets (FREESTYLE) lancets  07/21/12  Yes Historical Provider, MD  levothyroxine (SYNTHROID, LEVOTHROID) 75 MCG tablet Take 1 tablet (75 mcg total) by mouth daily. 12/04/12  Yes Ileana Ladd, MD  Melatonin 3 MG TABS Take 3 mg by mouth at bedtime as needed.   Yes  Historical Provider, MD  metoprolol succinate (TOPROL-XL) 50 MG 24 hr tablet Take 1 tablet (50 mg total) by mouth daily. Take with or immediately following a meal. 12/04/12  Yes Ileana Ladd, MD  Roxy Horseman Laxative 64.8-324 MG TABS Take 2 tablets by mouth at bedtime.    Yes Historical Provider, MD  Omega-3 Fatty Acids (FISH OIL) 1000 MG CAPS Take 1 capsule by mouth daily.   Yes Historical Provider, MD  omeprazole (PRILOSEC) 20 MG capsule Take 1 capsule (20 mg total) by mouth daily. 08/29/12  Yes Ileana Ladd, MD  simvastatin (ZOCOR) 20 MG tablet Take 1 tablet (20 mg total) by mouth daily. 12/04/12  Yes Ileana Ladd, MD  sitaGLIPtin (JANUVIA) 50 MG tablet Take 1 tablet (50 mg total) by mouth daily. 12/04/12  Yes Ileana Ladd, MD  traMADol (ULTRAM) 50 MG tablet Take 1 tablet (50 mg total) by mouth every 8 (eight) hours as needed for pain. 12/04/12  Yes Ileana Ladd, MD  triamcinolone cream (KENALOG) 0.1 % Apply topically 2 (two) times daily. 11/25/12  Yes Deatra Canter, FNP     ROS: As above in the HPI. All other systems are stable or negative.  OBJECTIVE: APPEARANCE:  Patient in no acute distress.The patient appeared well nourished and normally developed. Acyanotic. Waist: VITAL SIGNS:BP 129/58  Pulse 56  Temp(Src) 97.3 F (36.3 C) (Oral)  Ht 5\' 5"  (1.651 m)  Wt 191 lb (86.637 kg)  BMI 31.78 kg/m2 Obese WM Elderly quiet Reduced hearing  SKIN: warm and  Dry without overt rashes, tattoos and scars  HEAD and Neck: without JVD, Head and scalp: normal Eyes:No scleral icterus. Fundi normal, eye movements normal. Ears: Auricle normal, canal normal, bilateral loss of hearing and he has hearing aids in. Nose: normal Throat: normal Neck & thyroid: normal  CHEST & LUNGS: Chest wall: normal Lungs: Clear  CVS: Reveals the PMI to be normally located. Regular rhythm, First and Second Heart sounds are normal,  absence of murmurs, rubs or gallops.  ABDOMEN:  Appearance:obese,  protuberant. Abdominal ventral hernia , unchanged Benign, no organomegaly, no masses, no Abdominal Aortic enlargement. No Guarding , no rebound. No Bruits. Bowel sounds: normal  RECTAL: [name] GU: [name]  EXTREMETIES:  1+ edematous.   MUSCULOSKELETAL:  Spine:reduced ROM, with pain on lying back and  Sitting up. Joints: intact  NEUROLOGIC: oriented to,place and person; non-focal in extremities.  ASSESSMENT: TIA  HYPERTENSION - Plan: metoprolol succinate (TOPROL-XL) 50 MG 24 hr tablet, furosemide (LASIX) 40 MG tablet, amLODipine-benazepril (LOTREL)  5-20 MG per capsule, COMPLETE METABOLIC PANEL WITH GFR  CORONARY ATHEROSCLEROSIS NATIVE CORONARY ARTERY - Plan: metoprolol succinate (TOPROL-XL) 50 MG 24 hr tablet, simvastatin (ZOCOR) 20 MG tablet, furosemide (LASIX) 40 MG tablet, amLODipine-benazepril (LOTREL) 5-20 MG per capsule, NMR Lipoprofile with Lipids  Unspecified vitamin D deficiency  Other and unspecified hyperlipidemia - Plan: simvastatin (ZOCOR) 20 MG tablet, COMPLETE METABOLIC PANEL WITH GFR, NMR Lipoprofile with Lipids  DIAB W/O MENTION COMP TYPE II/UNS TYPE UNCNTRL - Plan: sitaGLIPtin (JANUVIA) 50 MG tablet, POCT glycosylated hemoglobin (Hb A1C), COMPLETE METABOLIC PANEL WITH GFR  Arthritis  Aortic insufficiency  Iron deficiency anemia, unspecified  Insomnia  Hard of hearing, unspecified laterality  Old myocardial infarction  Hypothyroid - Plan: levothyroxine (SYNTHROID, LEVOTHROID) 75 MCG tablet, TSH  Chronic back pain - Plan: traMADol (ULTRAM) 50 MG tablet  Need for Zostavax administration - Plan: Varicella-zoster vaccine subcutaneous  Need for Tdap vaccination - Plan: Tdap vaccine greater than or equal to 7yo IM  PLAN: Orders Placed This Encounter  Procedures  . Tdap vaccine greater than or equal to 7yo IM  . Varicella-zoster vaccine subcutaneous  . COMPLETE METABOLIC PANEL WITH GFR  . NMR Lipoprofile with Lipids  . TSH  . POCT glycosylated hemoglobin  (Hb A1C)   Meds ordered this encounter  Medications  . traMADol (ULTRAM) 50 MG tablet    Sig: Take 1 tablet (50 mg total) by mouth every 8 (eight) hours as needed for pain.    Dispense:  60 tablet    Refill:  1  . metoprolol succinate (TOPROL-XL) 50 MG 24 hr tablet    Sig: Take 1 tablet (50 mg total) by mouth daily. Take with or immediately following a meal.    Dispense:  30 tablet    Refill:  11  . sitaGLIPtin (JANUVIA) 50 MG tablet    Sig: Take 1 tablet (50 mg total) by mouth daily.    Dispense:  30 tablet    Refill:  11  . simvastatin (ZOCOR) 20 MG tablet    Sig: Take 1 tablet (20 mg total) by mouth daily.    Dispense:  30 tablet    Refill:  11    Order Specific Question:  Supervising Provider    Answer:  Ernestina Penna [[id number]]  . levothyroxine (SYNTHROID, LEVOTHROID) 75 MCG tablet    Sig: Take 1 tablet (75 mcg total) by mouth daily.    Dispense:  30 tablet    Refill:  11  . furosemide (LASIX) 40 MG tablet    Sig: Take 1 tablet (40 mg total) by mouth daily.    Dispense:  30 tablet    Refill:  11  . amLODipine-benazepril (LOTREL) 5-20 MG per capsule    Sig: Take 1 capsule by mouth daily.    Dispense:  30 capsule    Refill:  11   Continue present level of care, especially by the wife. Their anniversary is next month of 67 years.  Advised tempering the bacon and fat in the diet.  Return in about 3 months (around 03/06/2013) for Recheck medical problems.  Naquan Garman P. [place] Charon, M.D.

## 2012-12-05 ENCOUNTER — Other Ambulatory Visit: Payer: Self-pay

## 2012-12-05 LAB — NMR LIPOPROFILE WITH LIPIDS
Cholesterol, Total: 110 mg/dL (ref <200)
HDL Particle Number: 26.5 umol/L — ABNORMAL LOW (ref >=30.5)
HDL Size: 8.9 nm — ABNORMAL LOW (ref >=9.2)
HDL-C: 31 mg/dL — ABNORMAL LOW (ref >=40)
LDL (calc): 63 mg/dL (ref <100)
LDL Particle Number: 784 nmol/L (ref <1000)
LDL Size: 20 nm — ABNORMAL LOW (ref >20.5)
LP-IR Score: 61 — ABNORMAL HIGH (ref <=45)
Large HDL-P: 2.9 umol/L — ABNORMAL LOW (ref >=4.8)
Large VLDL-P: 2.6 nmol/L (ref <=2.7)
Small LDL Particle Number: 553 nmol/L — ABNORMAL HIGH (ref <=527)
Triglycerides: 79 mg/dL (ref <150)
VLDL Size: 49 nm — ABNORMAL HIGH (ref <=46.6)

## 2012-12-05 MED ORDER — KETOCONAZOLE 2 % EX CREA: TOPICAL_CREAM | Freq: Two times a day (BID) | CUTANEOUS | Status: DC

## 2012-12-05 NOTE — Telephone Encounter (Signed)
Drug store sent for refill  Don't know if he still should be on this   11/27/12  [facility]

## 2012-12-06 MED ORDER — CLOBETASOL PROPIONATE 0.05 % EX CREA: TOPICAL_CREAM | Freq: Two times a day (BID) | CUTANEOUS | Status: DC

## 2012-12-08 NOTE — Progress Notes (Signed)
Quick Note:  Lab result at goal. No change in Medications for now. No Change in plans and follow up. ______ 

## 2012-12-23 ENCOUNTER — Other Ambulatory Visit: Payer: Self-pay

## 2012-12-23 MED ORDER — OMEPRAZOLE 20 MG PO CPDR: 20.0000 mg | DELAYED_RELEASE_CAPSULE | Freq: Every day | ORAL | Status: DC

## 2013-01-31 ENCOUNTER — Other Ambulatory Visit: Payer: Self-pay | Admitting: *Deleted

## 2013-01-31 DIAGNOSIS — E119 Type 2 diabetes mellitus without complications: Secondary | ICD-10-CM

## 2013-01-31 MED ORDER — GLUCOSE BLOOD VI STRP: ORAL_STRIP | Status: DC

## 2013-01-31 MED ORDER — ACCU-CHEK MULTICLIX LANCETS MISC: Status: DC

## 2013-02-26 ENCOUNTER — Ambulatory Visit (INDEPENDENT_AMBULATORY_CARE_PROVIDER_SITE_OTHER): Payer: Medicare Other

## 2013-02-26 ENCOUNTER — Telehealth: Payer: Self-pay | Admitting: *Deleted

## 2013-02-26 DIAGNOSIS — Z23 Encounter for immunization: Secondary | ICD-10-CM

## 2013-02-26 NOTE — Telephone Encounter (Signed)
Last filled 01/27/13, last seen 12/04/12, route to pool A if approved so it can be called into [facility]

## 2013-03-02 NOTE — Telephone Encounter (Signed)
What is it to be filled????

## 2013-03-03 ENCOUNTER — Other Ambulatory Visit: Payer: Self-pay | Admitting: Family Medicine

## 2013-03-03 DIAGNOSIS — G8929 Other chronic pain: Secondary | ICD-10-CM

## 2013-03-03 MED ORDER — TRAMADOL HCL 50 MG PO TABS: 50.0000 mg | ORAL_TABLET | Freq: Three times a day (TID) | ORAL | Status: DC | PRN

## 2013-03-03 NOTE — Telephone Encounter (Signed)
Tramadol

## 2013-03-03 NOTE — Telephone Encounter (Signed)
Rx ready for pick up. 

## 2013-03-06 ENCOUNTER — Ambulatory Visit: Payer: Medicare Other | Admitting: Family Medicine

## 2013-03-06 NOTE — Telephone Encounter (Signed)
RX READY

## 2013-04-30 ENCOUNTER — Telehealth: Payer: Self-pay | Admitting: Family Medicine

## 2013-04-30 NOTE — Telephone Encounter (Signed)
appt with dr. [place] Charon at 9:00

## 2013-05-02 ENCOUNTER — Ambulatory Visit: Payer: Medicare Other | Admitting: Family Medicine

## 2013-05-16 ENCOUNTER — Encounter: Payer: Self-pay | Admitting: Family Medicine

## 2013-05-16 ENCOUNTER — Encounter (INDEPENDENT_AMBULATORY_CARE_PROVIDER_SITE_OTHER): Payer: Self-pay

## 2013-05-16 ENCOUNTER — Ambulatory Visit (INDEPENDENT_AMBULATORY_CARE_PROVIDER_SITE_OTHER): Payer: Medicare Other | Admitting: Family Medicine

## 2013-05-16 VITALS — BP 115/61 | HR 66 | Temp 97.2°F | Ht 65.0 in | Wt 198.4 lb

## 2013-05-16 DIAGNOSIS — I351 Nonrheumatic aortic (valve) insufficiency: Secondary | ICD-10-CM

## 2013-05-16 DIAGNOSIS — E039 Hypothyroidism, unspecified: Secondary | ICD-10-CM

## 2013-05-16 DIAGNOSIS — G4700 Insomnia, unspecified: Secondary | ICD-10-CM

## 2013-05-16 DIAGNOSIS — M1990 Unspecified osteoarthritis, unspecified site: Secondary | ICD-10-CM

## 2013-05-16 DIAGNOSIS — E119 Type 2 diabetes mellitus without complications: Secondary | ICD-10-CM

## 2013-05-16 DIAGNOSIS — E559 Vitamin D deficiency, unspecified: Secondary | ICD-10-CM

## 2013-05-16 DIAGNOSIS — I2510 Atherosclerotic heart disease of native coronary artery without angina pectoris: Secondary | ICD-10-CM

## 2013-05-16 DIAGNOSIS — G459 Transient cerebral ischemic attack, unspecified: Secondary | ICD-10-CM

## 2013-05-16 DIAGNOSIS — D509 Iron deficiency anemia, unspecified: Secondary | ICD-10-CM

## 2013-05-16 DIAGNOSIS — I25810 Atherosclerosis of coronary artery bypass graft(s) without angina pectoris: Secondary | ICD-10-CM

## 2013-05-16 DIAGNOSIS — I10 Essential (primary) hypertension: Secondary | ICD-10-CM

## 2013-05-16 DIAGNOSIS — I359 Nonrheumatic aortic valve disorder, unspecified: Secondary | ICD-10-CM

## 2013-05-16 DIAGNOSIS — M129 Arthropathy, unspecified: Secondary | ICD-10-CM

## 2013-05-16 DIAGNOSIS — K219 Gastro-esophageal reflux disease without esophagitis: Secondary | ICD-10-CM

## 2013-05-16 DIAGNOSIS — IMO0001 Reserved for inherently not codable concepts without codable children: Secondary | ICD-10-CM

## 2013-05-16 DIAGNOSIS — E785 Hyperlipidemia, unspecified: Secondary | ICD-10-CM

## 2013-05-16 DIAGNOSIS — H9190 Unspecified hearing loss, unspecified ear: Secondary | ICD-10-CM

## 2013-05-16 NOTE — Progress Notes (Signed)
Patient ID: [name], male   DOB: Jun 20, 1924, 79 y.o.   MRN: [id number] SUBJECTIVE: CC: Chief Complaint  Patient presents with  . Hypertension  . Hyperlipidemia  . Diabetes  . Gastrophageal Reflux  . Coronary Artery Disease    HPI: Patient is here for follow up of Diabetes Mellitus: Symptoms evaluated: Denies Nocturia ,Denies Urinary Frequency , denies Blurred vision ,deniesDizziness,denies.Dysuria,denies paresthesias, denies extremity pain or ulcers.[name] chest pain. has had an annual eye exam. Do not check the feet. Does check CBGs. Average CBG:had BS at 239, [id number]. Denies episodes of hypoglycemia. Does have an emergency hypoglycemic plan. admits toCompliance with medications. Denies Problems with medications.  Past Medical History  Diagnosis Date  . Diabetes mellitus     Diet control   . Hypertension   . History of pneumonia   . Hemorrhoids   . MI (myocardial infarction) 1996    percutaneous coronary intervention   . GERD (gastroesophageal reflux disease)   . Hypothyroidism   . Diverticulosis   . Aortic insufficiency   . Aortic valve sclerosis   . Dyslipidemia   . Hard of hearing   . Small bowel obstruction    Past Surgical History  Procedure Laterality Date  . Exploratory laparotomy w/ bowel resection      ventral hernia repair  . Transurethral resection of prostate    . Cholecystectomy    . Colonoscopy  2008  . Abdominal hernia repair      multiple sx's  . Cataract extraction      left eye   History   Social History  . Marital Status: Married    Spouse Name: N/A    Number of Children: N/A  . Years of Education: N/A   Occupational History  . Retired    Social History Main Topics  . Smoking status: Former Smoker    Quit date: 08/09/1942  . Smokeless tobacco: Never Used  . Alcohol Use: No  . Drug Use: No  . Sexual Activity: Not on file   Other Topics Concern  . Not on file   Social History Narrative  . No narrative on file    Family History  Problem Relation Age of Onset  . Colon cancer Neg Hx    Current Outpatient Prescriptions on File Prior to Visit  Medication Sig Dispense Refill  . amLODipine-benazepril (LOTREL) 5-20 MG per capsule Take 1 capsule by mouth daily.  30 capsule  11  . aspirin 81 MG tablet Take 81 mg by mouth daily.      . clobetasol cream (TEMOVATE) 0.05 % Apply topically 2 (two) times daily.  60 g  0  . furosemide (LASIX) 40 MG tablet Take 1 tablet (40 mg total) by mouth daily.  30 tablet  11  . glucose blood test strip Check blood sugar bid and prn  100 each  12  . ketoconazole (NIZORAL) 2 % cream Apply topically 2 (two) times daily.  15 g  2  . Lancets (ACCU-CHEK MULTICLIX) lancets Use as instructed- check Blood sugar bid and prn  100 each  12  . levothyroxine (SYNTHROID, LEVOTHROID) 75 MCG tablet Take 1 tablet (75 mcg total) by mouth daily.  30 tablet  11  . Melatonin 3 MG TABS Take 3 mg by mouth at bedtime as needed.      . metoprolol succinate (TOPROL-XL) 50 MG 24 hr tablet Take 1 tablet (50 mg total) by mouth daily. Take with or immediately following a meal.  30 tablet  11  .  Natural Senna Laxative 64.8-324 MG TABS Take 2 tablets by mouth at bedtime.       . Omega-3 Fatty Acids (FISH OIL) 1000 MG CAPS Take 1 capsule by mouth daily.      Marland Kitchen omeprazole (PRILOSEC) 20 MG capsule Take 1 capsule (20 mg total) by mouth daily.  30 capsule  5  . simvastatin (ZOCOR) 20 MG tablet Take 1 tablet (20 mg total) by mouth daily.  30 tablet  11  . sitaGLIPtin (JANUVIA) 50 MG tablet Take 1 tablet (50 mg total) by mouth daily.  30 tablet  11  . traMADol (ULTRAM) 50 MG tablet Take 1 tablet (50 mg total) by mouth every 8 (eight) hours as needed for pain.  60 tablet  0  . triamcinolone cream (KENALOG) 0.1 % Apply topically 2 (two) times daily.  30 g  3   No current facility-administered medications on file prior to visit.   No Known Allergies Immunization History  Administered Date(s) Administered  .  Influenza,inj,Quad PF,36+ Mos 02/26/2013  . Tdap 12/04/2012  . Zoster 12/04/2012   Prior to Admission medications   Medication Sig Start Date End Date Taking? Authorizing Provider  amLODipine-benazepril (LOTREL) 5-20 MG per capsule Take 1 capsule by mouth daily. 12/04/12  Yes Ileana Ladd, MD  aspirin 81 MG tablet Take 81 mg by mouth daily.   Yes Historical Provider, MD  clobetasol cream (TEMOVATE) 0.05 % Apply topically 2 (two) times daily. 12/05/12  Yes Ileana Ladd, MD  furosemide (LASIX) 40 MG tablet Take 1 tablet (40 mg total) by mouth daily. 12/04/12  Yes Ileana Ladd, MD  glucose blood test strip Check blood sugar bid and prn 01/31/13  Yes Ileana Ladd, MD  ketoconazole (NIZORAL) 2 % cream Apply topically 2 (two) times daily. 12/05/12  Yes Ileana Ladd, MD  Lancets (ACCU-CHEK MULTICLIX) lancets Use as instructed- check Blood sugar bid and prn 01/31/13  Yes Ileana Ladd, MD  levothyroxine (SYNTHROID, LEVOTHROID) 75 MCG tablet Take 1 tablet (75 mcg total) by mouth daily. 12/04/12  Yes Ileana Ladd, MD  Melatonin 3 MG TABS Take 3 mg by mouth at bedtime as needed.   Yes Historical Provider, MD  metoprolol succinate (TOPROL-XL) 50 MG 24 hr tablet Take 1 tablet (50 mg total) by mouth daily. Take with or immediately following a meal. 12/04/12  Yes Ileana Ladd, MD  Roxy Horseman Laxative 64.8-324 MG TABS Take 2 tablets by mouth at bedtime.    Yes Historical Provider, MD  Omega-3 Fatty Acids (FISH OIL) 1000 MG CAPS Take 1 capsule by mouth daily.   Yes Historical Provider, MD  omeprazole (PRILOSEC) 20 MG capsule Take 1 capsule (20 mg total) by mouth daily. 12/23/12  Yes Ileana Ladd, MD  simvastatin (ZOCOR) 20 MG tablet Take 1 tablet (20 mg total) by mouth daily. 12/04/12  Yes Ileana Ladd, MD  sitaGLIPtin (JANUVIA) 50 MG tablet Take 1 tablet (50 mg total) by mouth daily. 12/04/12  Yes Ileana Ladd, MD  traMADol (ULTRAM) 50 MG tablet Take 1 tablet (50 mg total) by mouth every 8 (eight) hours as  needed for pain. 03/03/13  Yes Ileana Ladd, MD  triamcinolone cream (KENALOG) 0.1 % Apply topically 2 (two) times daily. 11/25/12  Yes Deatra Canter, FNP     ROS: As above in the HPI. All other systems are stable or negative.  OBJECTIVE: APPEARANCE:  Patient in no acute distress.The patient appeared well nourished and  normally developed. Acyanotic. Waist: VITAL SIGNS:BP 115/61  Pulse 66  Temp(Src) 97.2 F (36.2 C) (Oral)  Ht 5\' 5"  (1.651 m)  Wt 198 lb 6.4 oz (89.994 kg)  BMI 33.02 kg/m2  Obese elderly WM  SKIN: warm and  Dry without overt rashes, tattoos and scars  HEAD and Neck: without JVD, Head and scalp: normal Eyes:No scleral icterus. Fundi normal, eye movements normal. Ears: Auricle normal, canal normal, Tympanic membranes normal, insufflation normal. Nose: normal Throat: normal Neck & thyroid: normal  CHEST & LUNGS: Chest wall: normal Lungs: Clear  CVS: Reveals the PMI to be normally located. Regular rhythm, First and Second Heart sounds are normal,  absence of rubs or gallops.soft 1/6 systolic murmur at the Aortic area. Peripheral vasculature: Radial pulses: normal Dorsal pedis pulses: normal Posterior pulses: normal  ABDOMEN:  Appearance: ventral hernia , Obese Benign, no organomegaly, no masses, no Abdominal Aortic enlargement. No Guarding , no rebound. No Bruits. Bowel sounds: normal  RECTAL: N/A GU: N/A  EXTREMETIES: nonedematous.  MUSCULOSKELETAL:  Spine: decreased ROM  NEUROLOGIC: oriented place and person; nonfocal. Results for orders placed in visit on 12/04/12  COMPLETE METABOLIC PANEL WITH GFR      Result Value Range   Sodium 141  135 - 145 mEq/L   Potassium 4.4  3.5 - 5.3 mEq/L   Chloride 104  96 - 112 mEq/L   CO2 28  19 - 32 mEq/L   Glucose, Bld 116 (*) 70 - 99 mg/dL   BUN 15  6 - 23 mg/dL   Creat 1.91  4.78 - 2.95 mg/dL   Total Bilirubin 0.5  0.3 - 1.2 mg/dL   Alkaline Phosphatase 71  39 - 117 U/L   AST 13  0 - 37 U/L    ALT 9  0 - 53 U/L   Total Protein 6.7  6.0 - 8.3 g/dL   Albumin 4.0  3.5 - 5.2 g/dL   Calcium 8.9  8.4 - 62.1 mg/dL   GFR, Est African American 56 (*)    GFR, Est Non African American 49 (*)   NMR LIPOPROFILE WITH LIPIDS      Result Value Range   LDL Particle Number 784  <1000 nmol/L   LDL (calc) 63  <100 mg/dL   HDL-C 31 (*) >=30 mg/dL   Triglycerides 79  <865 mg/dL   Cholesterol, Total 784  <200 mg/dL   HDL Particle Number 69.6 (*) >=30.5 umol/L   Large HDL-P 2.9 (*) >=4.8 umol/L   Large VLDL-P 2.6  <=2.7 nmol/L   Small LDL Particle Number 553 (*) <=527 nmol/L   LDL Size 20.0 (*) >20.5 nm   HDL Size 8.9 (*) >=9.2 nm   VLDL Size 49.0 (*) <=46.6 nm   LP-IR Score 61 (*) <=45  TSH      Result Value Range   TSH 2.495  0.350 - 4.500 uIU/mL  POCT GLYCOSYLATED HEMOGLOBIN (HGB A1C)      Result Value Range   Hemoglobin A1C 6.0%      ASSESSMENT:  Hypertension - Plan: CMP14+EGFR  Hyperlipemia - Plan: CMP14+EGFR, NMR, lipoprofile  Diabetes mellitus, type 2 - Plan: POCT glycosylated hemoglobin (Hb A1C), Hemoglobin A1c  GERD (gastroesophageal reflux disease)  CAD (coronary artery disease) of artery bypass graft  Hypothyroid - Plan: TSH  CORONARY ATHEROSCLEROSIS NATIVE CORONARY ARTERY  HYPERTENSION  TIA  Aortic insufficiency  Arthritis  DIAB W/O MENTION COMP TYPE II/UNS TYPE UNCNTRL  Other and unspecified hyperlipidemia  Unspecified vitamin D deficiency  Hard of hearing, unspecified laterality  Iron deficiency anemia, unspecified  Insomnia  PLAN: Discussed with patient and  Daughter that patient needs activities. Though limited by arthritis and marked obesity he does need to have daily activities. Demonstrated to patient and daughter chair exercises that patient could do.  Dietary changes discussed. More plant based.  Orders Placed This Encounter  Procedures  . CMP14+EGFR  . NMR, lipoprofile  . TSH  . Hemoglobin A1c  . POCT glycosylated hemoglobin (Hb  A1C)  same medications  No orders of the defined types were placed in this encounter.   There are no discontinued medications. Return in about 3 months (around 08/14/2013) for Recheck medical problems.  Leelan Rajewski P. Modesto Charon, M.D.

## 2013-05-17 LAB — NMR, LIPOPROFILE
Cholesterol: 111 mg/dL (ref <200)
HDL Cholesterol by NMR: 29 mg/dL — ABNORMAL LOW (ref >=40)
HDL Particle Number: 27.2 umol/L — ABNORMAL LOW (ref >=30.5)
LDL Particle Number: 817 nmol/L (ref <1000)
LDL Size: 19.8 nm — ABNORMAL LOW (ref >20.5)
LDLC SERPL CALC-MCNC: 26 mg/dL (ref <100)
LP-IR Score: 77 — ABNORMAL HIGH (ref <=45)
Small LDL Particle Number: 700 nmol/L — ABNORMAL HIGH (ref <=527)
Triglycerides by NMR: 280 mg/dL — ABNORMAL HIGH (ref <150)

## 2013-05-17 LAB — CMP14+EGFR
ALT: 12 IU/L (ref 0–44)
AST: 16 IU/L (ref 0–40)
Albumin/Globulin Ratio: 1.6 (ref 1.1–2.5)
Albumin: 3.9 g/dL (ref 3.5–4.7)
Alkaline Phosphatase: 67 IU/L (ref 39–117)
BUN/Creatinine Ratio: 13 (ref 10–22)
BUN: 16 mg/dL (ref 8–27)
CO2: 25 mmol/L (ref 18–29)
Calcium: 8.8 mg/dL (ref 8.6–10.2)
Chloride: 99 mmol/L (ref 97–108)
Creatinine, Ser: 1.27 mg/dL (ref 0.76–1.27)
GFR calc Af Amer: 59 mL/min/{1.73_m2} — ABNORMAL LOW (ref >59)
GFR calc non Af Amer: 51 mL/min/{1.73_m2} — ABNORMAL LOW (ref >59)
Globulin, Total: 2.5 g/dL (ref 1.5–4.5)
Glucose: 140 mg/dL — ABNORMAL HIGH (ref 65–99)
Potassium: 3.8 mmol/L (ref 3.5–5.2)
Sodium: 138 mmol/L (ref 134–144)
Total Bilirubin: <0.2 mg/dL (ref 0.0–1.2)
Total Protein: 6.4 g/dL (ref 6.0–8.5)

## 2013-05-17 LAB — HEMOGLOBIN A1C
Est. average glucose Bld gHb Est-mCnc: 137 mg/dL
Hgb A1c MFr Bld: 6.4 % — ABNORMAL HIGH (ref 4.8–5.6)

## 2013-05-17 LAB — TSH: TSH: 4.35 u[IU]/mL (ref 0.450–4.500)

## 2013-07-04 ENCOUNTER — Other Ambulatory Visit: Payer: Self-pay | Admitting: *Deleted

## 2013-07-04 DIAGNOSIS — M549 Dorsalgia, unspecified: Principal | ICD-10-CM

## 2013-07-04 DIAGNOSIS — G8929 Other chronic pain: Secondary | ICD-10-CM

## 2013-07-04 NOTE — Telephone Encounter (Signed)
Last filled 04/07/13, last seen 05/16/13. If approved have nurse call into [facility]

## 2013-07-07 ENCOUNTER — Other Ambulatory Visit: Payer: Self-pay | Admitting: Family Medicine

## 2013-07-07 MED ORDER — MECLIZINE HCL 25 MG PO TABS: 25.0000 mg | ORAL_TABLET | Freq: Three times a day (TID) | ORAL | Status: DC | PRN

## 2013-07-07 MED ORDER — TRAMADOL HCL 50 MG PO TABS: 50.0000 mg | ORAL_TABLET | Freq: Three times a day (TID) | ORAL | Status: DC | PRN

## 2013-07-07 NOTE — Telephone Encounter (Signed)
Call patient : Prescription refilled & sent to pharmacy in [facility]. 

## 2013-07-07 NOTE — Telephone Encounter (Signed)
Wants something for inner ear called in. Patient aware RX ready to pick up

## 2013-07-07 NOTE — Telephone Encounter (Signed)
Rx ready for pick up. 

## 2013-07-18 ENCOUNTER — Other Ambulatory Visit: Payer: Self-pay | Admitting: Family Medicine

## 2013-08-15 ENCOUNTER — Ambulatory Visit: Payer: Medicare Other | Admitting: Family Medicine

## 2013-08-19 ENCOUNTER — Ambulatory Visit: Payer: Medicare Other | Admitting: Family Medicine

## 2013-09-05 ENCOUNTER — Encounter: Payer: Self-pay | Admitting: Family Medicine

## 2013-09-05 ENCOUNTER — Ambulatory Visit (INDEPENDENT_AMBULATORY_CARE_PROVIDER_SITE_OTHER): Payer: Medicare Other | Admitting: Family Medicine

## 2013-09-05 VITALS — BP 130/65 | HR 79 | Temp 97.9°F | Ht 65.0 in | Wt 203.0 lb

## 2013-09-05 DIAGNOSIS — I10 Essential (primary) hypertension: Secondary | ICD-10-CM

## 2013-09-05 DIAGNOSIS — M129 Arthropathy, unspecified: Secondary | ICD-10-CM

## 2013-09-05 DIAGNOSIS — I2510 Atherosclerotic heart disease of native coronary artery without angina pectoris: Secondary | ICD-10-CM

## 2013-09-05 DIAGNOSIS — I351 Nonrheumatic aortic (valve) insufficiency: Secondary | ICD-10-CM

## 2013-09-05 DIAGNOSIS — D509 Iron deficiency anemia, unspecified: Secondary | ICD-10-CM

## 2013-09-05 DIAGNOSIS — H6120 Impacted cerumen, unspecified ear: Secondary | ICD-10-CM | POA: Insufficient documentation

## 2013-09-05 DIAGNOSIS — H9190 Unspecified hearing loss, unspecified ear: Secondary | ICD-10-CM

## 2013-09-05 DIAGNOSIS — E119 Type 2 diabetes mellitus without complications: Secondary | ICD-10-CM

## 2013-09-05 DIAGNOSIS — E1165 Type 2 diabetes mellitus with hyperglycemia: Secondary | ICD-10-CM

## 2013-09-05 DIAGNOSIS — E039 Hypothyroidism, unspecified: Secondary | ICD-10-CM

## 2013-09-05 DIAGNOSIS — I359 Nonrheumatic aortic valve disorder, unspecified: Secondary | ICD-10-CM

## 2013-09-05 DIAGNOSIS — E785 Hyperlipidemia, unspecified: Secondary | ICD-10-CM

## 2013-09-05 DIAGNOSIS — G4700 Insomnia, unspecified: Secondary | ICD-10-CM

## 2013-09-05 DIAGNOSIS — G459 Transient cerebral ischemic attack, unspecified: Secondary | ICD-10-CM

## 2013-09-05 DIAGNOSIS — IMO0001 Reserved for inherently not codable concepts without codable children: Secondary | ICD-10-CM

## 2013-09-05 DIAGNOSIS — M1990 Unspecified osteoarthritis, unspecified site: Secondary | ICD-10-CM

## 2013-09-05 DIAGNOSIS — E559 Vitamin D deficiency, unspecified: Secondary | ICD-10-CM

## 2013-09-05 LAB — POCT GLYCOSYLATED HEMOGLOBIN (HGB A1C): Hemoglobin A1C: 5.9

## 2013-09-05 NOTE — Progress Notes (Signed)
Patient ID: [name], male   DOB: 05-15-1925, 80 y.o.   MRN: [id number] SUBJECTIVE: CC: Chief Complaint  Patient presents with  . Follow-up    follow up chronic problems     HPI: Patient is here for follow up of Diabetes Mellitus/TIA/HTN/: Symptoms evaluated: Denies Nocturia ,Denies Urinary Frequency , denies Blurred vision ,deniesDizziness,denies.Dysuria,denies paresthesias, denies extremity pain or ulcers.[name] chest pain. has had an annual eye exam. do check the feet. Does check CBGs. Average CBG:not checked because diet controlled Denies episodes of hypoglycemia. Does have an emergency hypoglycemic plan. admits toCompliance with medications. Denies Problems with medications.  Past Medical History  Diagnosis Date  . Diabetes mellitus     Diet control   . Hypertension   . History of pneumonia   . Hemorrhoids   . MI (myocardial infarction) 1996    percutaneous coronary intervention   . GERD (gastroesophageal reflux disease)   . Hypothyroidism   . Diverticulosis   . Aortic insufficiency   . Aortic valve sclerosis   . Dyslipidemia   . Hard of hearing   . Small bowel obstruction    Past Surgical History  Procedure Laterality Date  . Exploratory laparotomy w/ bowel resection      ventral hernia repair  . Transurethral resection of prostate    . Cholecystectomy    . Colonoscopy  2008  . Abdominal hernia repair      multiple sx's  . Cataract extraction      left eye   History   Social History  . Marital Status: Married    Spouse Name: N/A    Number of Children: N/A  . Years of Education: N/A   Occupational History  . Retired    Social History Main Topics  . Smoking status: Former Smoker    Quit date: 08/09/1942  . Smokeless tobacco: Never Used  . Alcohol Use: No  . Drug Use: No  . Sexual Activity: Not on file   Other Topics Concern  . Not on file   Social History Narrative  . No narrative on file   Family History  Problem Relation Age of  Onset  . Colon cancer Neg Hx    Current Outpatient Prescriptions on File Prior to Visit  Medication Sig Dispense Refill  . amLODipine-benazepril (LOTREL) 5-20 MG per capsule Take 1 capsule by mouth daily.  30 capsule  11  . aspirin 81 MG tablet Take 81 mg by mouth daily.      . clobetasol cream (TEMOVATE) 0.05 % Apply topically 2 (two) times daily.  60 g  0  . furosemide (LASIX) 40 MG tablet Take 1 tablet (40 mg total) by mouth daily.  30 tablet  11  . glucose blood test strip Check blood sugar bid and prn  100 each  12  . JANUVIA 50 MG tablet TAKE 1 TABLET DAILY  30 tablet  3  . ketoconazole (NIZORAL) 2 % cream Apply topically 2 (two) times daily.  15 g  2  . Lancets (ACCU-CHEK MULTICLIX) lancets Use as instructed- check Blood sugar bid and prn  100 each  12  . levothyroxine (SYNTHROID, LEVOTHROID) 75 MCG tablet Take 1 tablet (75 mcg total) by mouth daily.  30 tablet  11  . meclizine (ANTIVERT) 25 MG tablet Take 1 tablet (25 mg total) by mouth 3 (three) times daily as needed for dizziness.  30 tablet  0  . Melatonin 3 MG TABS Take 3 mg by mouth at bedtime as needed.      Marland Kitchen  metoprolol succinate (TOPROL-XL) 50 MG 24 hr tablet Take 1 tablet (50 mg total) by mouth daily. Take with or immediately following a meal.  30 tablet  11  . Natural Senna Laxative 64.8-324 MG TABS Take 2 tablets by mouth at bedtime.       . Omega-3 Fatty Acids (FISH OIL) 1000 MG CAPS Take 1 capsule by mouth daily.      Marland Kitchen omeprazole (PRILOSEC) 20 MG capsule Take 1 capsule (20 mg total) by mouth daily.  30 capsule  5  . simvastatin (ZOCOR) 20 MG tablet Take 1 tablet (20 mg total) by mouth daily.  30 tablet  11  . traMADol (ULTRAM) 50 MG tablet Take 1 tablet (50 mg total) by mouth every 8 (eight) hours as needed.  60 tablet  0  . triamcinolone cream (KENALOG) 0.1 % Apply topically 2 (two) times daily.  30 g  3   No current facility-administered medications on file prior to visit.   No Known Allergies Immunization History   Administered Date(s) Administered  . Influenza,inj,Quad PF,36+ Mos 02/26/2013  . Tdap 12/04/2012  . Zoster 12/04/2012   Prior to Admission medications   Medication Sig Start Date End Date Taking? Authorizing Provider  amLODipine-benazepril (LOTREL) 5-20 MG per capsule Take 1 capsule by mouth daily. 12/04/12  Yes Vernie Shanks, MD  aspirin 81 MG tablet Take 81 mg by mouth daily.   Yes Historical Provider, MD  clobetasol cream (TEMOVATE) 0.05 % Apply topically 2 (two) times daily. 12/05/12  Yes Vernie Shanks, MD  furosemide (LASIX) 40 MG tablet Take 1 tablet (40 mg total) by mouth daily. 12/04/12  Yes Vernie Shanks, MD  glucose blood test strip Check blood sugar bid and prn 01/31/13  Yes Vernie Shanks, MD  JANUVIA 50 MG tablet TAKE 1 TABLET DAILY   Yes Vernie Shanks, MD  ketoconazole (NIZORAL) 2 % cream Apply topically 2 (two) times daily. 12/05/12  Yes Vernie Shanks, MD  Lancets (ACCU-CHEK MULTICLIX) lancets Use as instructed- check Blood sugar bid and prn 01/31/13  Yes Vernie Shanks, MD  levothyroxine (SYNTHROID, LEVOTHROID) 75 MCG tablet Take 1 tablet (75 mcg total) by mouth daily. 12/04/12  Yes Vernie Shanks, MD  meclizine (ANTIVERT) 25 MG tablet Take 1 tablet (25 mg total) by mouth 3 (three) times daily as needed for dizziness. 07/07/13  Yes Vernie Shanks, MD  Melatonin 3 MG TABS Take 3 mg by mouth at bedtime as needed.   Yes Historical Provider, MD  metoprolol succinate (TOPROL-XL) 50 MG 24 hr tablet Take 1 tablet (50 mg total) by mouth daily. Take with or immediately following a meal. 12/04/12  Yes Vernie Shanks, MD  Zack Seal Laxative 64.8-324 MG TABS Take 2 tablets by mouth at bedtime.    Yes Historical Provider, MD  Omega-3 Fatty Acids (FISH OIL) 1000 MG CAPS Take 1 capsule by mouth daily.   Yes Historical Provider, MD  omeprazole (PRILOSEC) 20 MG capsule Take 1 capsule (20 mg total) by mouth daily. 12/23/12  Yes Vernie Shanks, MD  simvastatin (ZOCOR) 20 MG tablet Take 1 tablet (20 mg  total) by mouth daily. 12/04/12  Yes Vernie Shanks, MD  traMADol (ULTRAM) 50 MG tablet Take 1 tablet (50 mg total) by mouth every 8 (eight) hours as needed. 07/04/13  Yes Vernie Shanks, MD  triamcinolone cream (KENALOG) 0.1 % Apply topically 2 (two) times daily. 11/25/12  Yes Lysbeth Penner, FNP  ROS: As above in the HPI. All other systems are stable or negative.  OBJECTIVE: APPEARANCE:  Patient in no acute distress.The patient appeared well nourished and normally developed. Acyanotic. Waist: VITAL SIGNS:BP 130/65  Pulse 79  Temp(Src) 97.9 F (36.6 C)  Ht 5' 5"  (1.651 m)  Wt 203 lb (92.08 kg)  BMI 33.78 kg/m2 Obese WM  SKIN: warm and  Dry without overt rashes, tattoos and scars  HEAD and Neck: without JVD, Head and scalp: normal Eyes:No scleral icterus. Fundi normal, eye movements normal. Ears: Auricle normal, canal bilateral hearing aids. He has cerumen in the left ear. Which was eventually irrigated out. Nose: normal Throat: normal Neck & thyroid: normal  CHEST & LUNGS: Chest wall: normal Lungs: Clear  CVS: Reveals the PMI to be normally located. Regular rhythm, First and Second Heart sounds are normal,  absence of murmurs, rubs or gallops. Peripheral vasculature: Radial pulses: normal Dorsal pedis pulses: normal Posterior pulses: normal  ABDOMEN:  Appearance: Obese with a ventral hernia, very large and reducible Benign, no organomegaly, no masses, no Abdominal Aortic enlargement. No Guarding , no rebound. No Bruits. Bowel sounds: normal  RECTAL: N/A GU: N/A  EXTREMETIES: 1 + edematous.  MUSCULOSKELETAL:  Spine: reduced ROM  NEUROLOGIC: oriented to time,place and person; nonfocal. Cranial Nerves are normal.   ASSESSMENT:  DIAB W/O MENTION COMP TYPE II/UNS TYPE UNCNTRL - Plan: POCT glycosylated hemoglobin (Hb A1C)  HYPERTENSION - Plan: CMP14+EGFR  TIA  CORONARY ATHEROSCLEROSIS NATIVE CORONARY ARTERY - Plan: NMR, lipoprofile  Aortic  insufficiency  Unspecified vitamin D deficiency - Plan: Vit D  25 hydroxy (rtn osteoporosis monitoring)  Other and unspecified hyperlipidemia - Plan: NMR, lipoprofile  Arthritis  Iron deficiency anemia, unspecified  Insomnia  Hard of hearing  Hypothyroid - Plan: TSH  Ceruminosis - left ear canal The hernia was evaluated in the past but decision was not to risk surgery at this time.  PLAN:  Obesity and DM handout in the AV given to caretaker daughter. Diet weight loss. Orders Placed This Encounter  Procedures  . CMP14+EGFR  . NMR, lipoprofile  . TSH  . Vit D  25 hydroxy (rtn osteoporosis monitoring)  . POCT glycosylated hemoglobin (Hb A1C)  no change in medications. No orders of the defined types were placed in this encounter.   There are no discontinued medications. Return in about 3 months (around 12/05/2013) for Recheck medical problems.  Zak Gondek P. Jacelyn Grip, M.D.

## 2013-09-05 NOTE — Patient Instructions (Signed)
Diabetes and Foot Care Diabetes may cause you to have problems because of poor blood supply (circulation) to your feet and legs. This may cause the skin on your feet to become thinner, break easier, and heal more slowly. Your skin may become dry, and the skin may peel and crack. You may also have nerve damage in your legs and feet causing decreased feeling in them. You may not notice minor injuries to your feet that could lead to infections or more serious problems. Taking care of your feet is one of the most important things you can do for yourself.  HOME CARE INSTRUCTIONS  Wear shoes at all times, even in the house. Do not go barefoot. Bare feet are easily injured.  Check your feet daily for blisters, cuts, and redness. If you cannot see the bottom of your feet, use a mirror or ask someone for help.  Wash your feet with warm water (do not use hot water) and mild soap. Then pat your feet and the areas between your toes until they are completely dry. Do not soak your feet as this can dry your skin.  Apply a moisturizing lotion or petroleum jelly (that does not contain alcohol and is unscented) to the skin on your feet and to dry, brittle toenails. Do not apply lotion between your toes.  Trim your toenails straight across. Do not dig under them or around the cuticle. File the edges of your nails with an emery board or nail file.  Do not cut corns or calluses or try to remove them with medicine.  Wear clean socks or stockings every day. Make sure they are not too tight. Do not wear knee-high stockings since they may decrease blood flow to your legs.  Wear shoes that fit properly and have enough cushioning. To break in new shoes, wear them for just a few hours a day. This prevents you from injuring your feet. Always look in your shoes before you put them on to be sure there are no objects inside.  Do not cross your legs. This may decrease the blood flow to your feet.  If you find a minor scrape,  cut, or break in the skin on your feet, keep it and the skin around it clean and dry. These areas may be cleansed with mild soap and water. Do not cleanse the area with peroxide, alcohol, or iodine.  When you remove an adhesive bandage, be sure not to damage the skin around it.  If you have a wound, look at it several times a day to make sure it is healing.  Do not use heating pads or hot water bottles. They may burn your skin. If you have lost feeling in your feet or legs, you may not know it is happening until it is too late.  Make sure your health care provider performs a complete foot exam at least annually or more often if you have foot problems. Report any cuts, sores, or bruises to your health care provider immediately. SEEK MEDICAL CARE IF:   You have an injury that is not healing.  You have cuts or breaks in the skin.  You have an ingrown nail.  You notice redness on your legs or feet.  You feel burning or tingling in your legs or feet.  You have pain or cramps in your legs and feet.  Your legs or feet are numb.  Your feet always feel cold. SEEK IMMEDIATE MEDICAL CARE IF:   There is increasing redness,   swelling, or pain in or around a wound.  There is a red line that goes up your leg.  Pus is coming from a wound.  You develop a fever or as directed by your health care provider.  You notice a bad smell coming from an ulcer or wound. Document Released: 05/12/2000 Document Revised: 01/15/2013 Document Reviewed: 10/22/2012 [facility] Patient Information 2014 [place].   Obesity Obesity is defined as having too much total body fat and a body mass index (BMI) of 30 or more. BMI is an estimate of body fat and is calculated from your height and weight. Obesity happens when you consume more calories than you can burn by exercising or performing daily physical tasks. Prolonged obesity can cause major illnesses or emergencies, such as:   A stroke.  Heart  disease.  Diabetes.  Cancer.  Arthritis.  High blood pressure (hypertension).  High cholesterol.  Sleep apnea.  Erectile dysfunction.  Infertility problems. CAUSES   Regularly eating unhealthy foods.  Physical inactivity.  Certain disorders, such as an underactive thyroid (hypothyroidism), Cushing's syndrome, and polycystic ovarian syndrome.  Certain medicines, such as steroids, some depression medicines, and antipsychotics.  Genetics.  Lack of sleep. DIAGNOSIS  A caregiver can diagnose obesity after calculating your BMI. Obesity will be diagnosed if your BMI is 30 or higher.  There are other methods of measuring obesity levels. Some other methods include measuring your skin fold thickness, your waist circumference, and comparing your hip circumference to your waist circumference. TREATMENT  A healthy treatment program includes some or all of the following:  Long-term dietary changes.  Exercise and physical activity.  Behavioral and lifestyle changes.  Medicine only under the supervision of your caregiver. Medicines may help, but only if they are used with diet and exercise programs. An unhealthy treatment program includes:  Fasting.  Fad diets.  Supplements and drugs. These choices do not succeed in long-term weight control.  HOME CARE INSTRUCTIONS   Exercise and perform physical activity as directed by your caregiver. To increase physical activity, try the following:  Use stairs instead of elevators.  Park farther away from store entrances.  Garden, bike, or walk instead of watching television or using the computer.  Eat healthy, low-calorie foods and drinks on a regular basis. Eat more fruits and vegetables. Use low-calorie cookbooks or take healthy cooking classes.  Limit fast food, sweets, and processed snack foods.  Eat smaller portions.  Keep a daily journal of everything you eat. There are many free websites to help you with this. It may be  helpful to measure your foods so you can determine if you are eating the correct portion sizes.  Avoid drinking alcohol. Drink more water and drinks without calories.  Take vitamins and supplements only as recommended by your caregiver.  Weight-loss support groups, Nurse, mental health, counselors, and stress reduction education can also be very helpful. SEEK IMMEDIATE MEDICAL CARE IF:  You have chest pain or tightness.  You have trouble breathing or feel short of breath.  You have weakness or leg numbness.  You feel confused or have trouble talking.  You have sudden changes in your vision. MAKE SURE YOU:  Understand these instructions.  Will watch your condition.  Will get help right away if you are not doing well or get worse. Document Released: 06/22/2004 Document Revised: 11/14/2011 Document Reviewed: 06/21/2011 [facility] Patient Information 2014 [place].

## 2013-09-07 LAB — CMP14+EGFR
ALT: 21 IU/L (ref 0–44)
AST: 20 IU/L (ref 0–40)
Albumin/Globulin Ratio: 1.7 (ref 1.1–2.5)
Albumin: 4.3 g/dL (ref 3.5–4.7)
Alkaline Phosphatase: 69 IU/L (ref 39–117)
BUN/Creatinine Ratio: 11 (ref 10–22)
BUN: 14 mg/dL (ref 8–27)
CO2: 25 mmol/L (ref 18–29)
Calcium: 8.8 mg/dL (ref 8.6–10.2)
Chloride: 99 mmol/L (ref 97–108)
Creatinine, Ser: 1.25 mg/dL (ref 0.76–1.27)
GFR calc Af Amer: 60 mL/min/{1.73_m2} (ref >59)
GFR calc non Af Amer: 52 mL/min/{1.73_m2} — ABNORMAL LOW (ref >59)
Globulin, Total: 2.5 g/dL (ref 1.5–4.5)
Glucose: 115 mg/dL — ABNORMAL HIGH (ref 65–99)
Potassium: 3.8 mmol/L (ref 3.5–5.2)
Sodium: 140 mmol/L (ref 134–144)
Total Bilirubin: 0.3 mg/dL (ref 0.0–1.2)
Total Protein: 6.8 g/dL (ref 6.0–8.5)

## 2013-09-07 LAB — VITAMIN D 25 HYDROXY (VIT D DEFICIENCY, FRACTURES): Vit D, 25-Hydroxy: 31.6 ng/mL (ref 30.0–100.0)

## 2013-09-07 LAB — NMR, LIPOPROFILE
Cholesterol: 115 mg/dL (ref <200)
HDL Cholesterol by NMR: 35 mg/dL — ABNORMAL LOW (ref >=40)
HDL Particle Number: 27.8 umol/L — ABNORMAL LOW (ref >=30.5)
LDL Particle Number: 739 nmol/L (ref <1000)
LDL Size: 20 nm — ABNORMAL LOW (ref >20.5)
LDLC SERPL CALC-MCNC: 50 mg/dL (ref <100)
LP-IR Score: 65 — ABNORMAL HIGH (ref <=45)
Small LDL Particle Number: 477 nmol/L (ref <=527)
Triglycerides by NMR: 148 mg/dL (ref <150)

## 2013-09-07 LAB — TSH: TSH: 4.44 u[IU]/mL (ref 0.450–4.500)

## 2013-09-29 ENCOUNTER — Ambulatory Visit: Payer: Medicare Other | Admitting: Family Medicine

## 2013-11-12 ENCOUNTER — Other Ambulatory Visit: Payer: Self-pay | Admitting: *Deleted

## 2013-11-12 DIAGNOSIS — I10 Essential (primary) hypertension: Secondary | ICD-10-CM

## 2013-11-12 DIAGNOSIS — I2510 Atherosclerotic heart disease of native coronary artery without angina pectoris: Secondary | ICD-10-CM

## 2013-11-12 MED ORDER — SITAGLIPTIN PHOSPHATE 50 MG PO TABS: ORAL_TABLET | ORAL | Status: DC

## 2013-11-12 MED ORDER — METOPROLOL SUCCINATE ER 50 MG PO TB24: 50.0000 mg | ORAL_TABLET | Freq: Every day | ORAL | Status: DC

## 2013-11-19 ENCOUNTER — Ambulatory Visit (INDEPENDENT_AMBULATORY_CARE_PROVIDER_SITE_OTHER): Payer: Medicare HMO | Admitting: Physician Assistant

## 2013-11-19 ENCOUNTER — Encounter: Payer: Self-pay | Admitting: Physician Assistant

## 2013-11-19 VITALS — BP 134/68 | Temp 98.1°F | Ht 65.0 in | Wt 204.6 lb

## 2013-11-19 DIAGNOSIS — R3911 Hesitancy of micturition: Secondary | ICD-10-CM

## 2013-11-19 NOTE — Progress Notes (Signed)
Subjective:     Patient ID: [name], male   DOB: 15-Aug-1924, 80 y.o.   MRN: [id number]  HPI Pt brought by the daughter due to a concern Husband went to the bathroom with the father and noted it took him a lon time to start urination Pt states this is normal for him  Review of Systems  Gastrointestinal: Negative for abdominal pain and abdominal distention.  Genitourinary: Positive for frequency and decreased urine volume. Negative for dysuria, urgency, flank pain and enuresis.       Objective:   Physical Exam Defer    Assessment:     Urinary hesitancy.- probable BPH    Plan:     Discussed this is a probable combination of BPH and his large hernia Reviewed signs of UTI Family does not want to start any new med's at this time BPH handout given 15 min spent face to face with pt and family F/U prn

## 2013-11-19 NOTE — Patient Instructions (Signed)
Benign Prostatic Hyperplasia An enlarged prostate (benign prostatic hyperplasia) is common in older men. You may experience the following:  Weak urine stream.  Dribbling.  Feeling like the bladder has not emptied completely.  Difficulty starting urination.  Getting up frequently at night to urinate.  Urinating more frequently during the day. HOME CARE INSTRUCTIONS  Monitor your prostatic hyperplasia for any changes. The following actions may help to alleviate any discomfort you are experiencing:  Give yourself time when you urinate.  Stay away from alcohol.  Avoid beverages containing caffeine, such as coffee, tea, and colas, because they can make the problem worse.  Avoid decongestants, antihistamines, and some prescription medicines that can make the problem worse.  Follow up with your health care Freeman Borba for further treatment as recommended. SEEK MEDICAL CARE IF:  You are experiencing progressive difficulty voiding.  Your urine stream is progressively getting narrower.  You are awaking from sleep with the urge to void more frequently.  You are constantly feeling the need to void.  You experience loss of urine, especially in small amounts. SEEK IMMEDIATE MEDICAL CARE IF:   You develop increased pain with urination or are unable to urinate.  You develop severe abdominal pain, vomiting, a high fever, or fainting.  You develop back pain or blood in your urine. MAKE SURE YOU:   Understand these instructions.  Will watch your condition.  Will get help right away if you are not doing well or get worse. Document Released: 05/15/2005 Document Revised: 01/15/2013 Document Reviewed: 10/15/2012 [employer] Patient Information 2015 [employer]. This information is not intended to replace advice given to you by your health care Envy Meno. Make sure you discuss any questions you have with your health care Keelon Zurn.  

## 2013-12-15 ENCOUNTER — Other Ambulatory Visit: Payer: Self-pay | Admitting: *Deleted

## 2013-12-15 DIAGNOSIS — I10 Essential (primary) hypertension: Secondary | ICD-10-CM

## 2013-12-15 DIAGNOSIS — E785 Hyperlipidemia, unspecified: Secondary | ICD-10-CM

## 2013-12-15 MED ORDER — LEVOTHYROXINE SODIUM 75 MCG PO TABS: 75.0000 ug | ORAL_TABLET | Freq: Every day | ORAL | Status: DC

## 2013-12-15 MED ORDER — FUROSEMIDE 40 MG PO TABS: 40.0000 mg | ORAL_TABLET | Freq: Every day | ORAL | Status: DC

## 2013-12-15 MED ORDER — SIMVASTATIN 20 MG PO TABS: 20.0000 mg | ORAL_TABLET | Freq: Every day | ORAL | Status: DC

## 2013-12-15 MED ORDER — AMLODIPINE BESY-BENAZEPRIL HCL 5-20 MG PO CAPS: 1.0000 | ORAL_CAPSULE | Freq: Every day | ORAL | Status: DC

## 2014-03-04 ENCOUNTER — Ambulatory Visit (INDEPENDENT_AMBULATORY_CARE_PROVIDER_SITE_OTHER): Payer: Medicare HMO

## 2014-03-04 DIAGNOSIS — Z23 Encounter for immunization: Secondary | ICD-10-CM

## 2014-03-07 ENCOUNTER — Other Ambulatory Visit: Payer: Self-pay | Admitting: Family Medicine

## 2014-03-30 ENCOUNTER — Telehealth: Payer: Self-pay | Admitting: Family Medicine

## 2014-03-30 ENCOUNTER — Ambulatory Visit (INDEPENDENT_AMBULATORY_CARE_PROVIDER_SITE_OTHER): Payer: Medicare HMO | Admitting: Family Medicine

## 2014-03-30 ENCOUNTER — Encounter: Payer: Self-pay | Admitting: Family Medicine

## 2014-03-30 VITALS — BP 118/65 | HR 57 | Temp 96.4°F | Ht 65.0 in | Wt 206.0 lb

## 2014-03-30 DIAGNOSIS — L570 Actinic keratosis: Secondary | ICD-10-CM

## 2014-03-30 DIAGNOSIS — K219 Gastro-esophageal reflux disease without esophagitis: Secondary | ICD-10-CM

## 2014-03-30 MED ORDER — TRIAMCINOLONE ACETONIDE 0.1 % EX CREA: 1.0000 "application " | TOPICAL_CREAM | Freq: Two times a day (BID) | CUTANEOUS | Status: DC

## 2014-03-30 MED ORDER — OMEPRAZOLE 20 MG PO CPDR: 20.0000 mg | DELAYED_RELEASE_CAPSULE | Freq: Every day | ORAL | Status: DC

## 2014-03-30 NOTE — Progress Notes (Signed)
   Subjective:    Patient ID: [name], male    DOB: [birth year], 80 y.o.   MRN: [id number]  HPI C/o GERD sx's at night.  He has not been taking prilosec. He has a skin lesion on his back that is itching.  Review of Systems  Constitutional: Negative for fever.  HENT: Negative for ear pain.   Eyes: Negative for discharge.  Respiratory: Negative for cough.   Cardiovascular: Negative for chest pain.  Gastrointestinal: Negative for abdominal distention.  Endocrine: Negative for polyuria.  Genitourinary: Negative for difficulty urinating.  Musculoskeletal: Negative for gait problem and neck pain.  Skin: Negative for color change and rash.       C/o itching on back skin lesion  Neurological: Negative for speech difficulty and headaches.  Psychiatric/Behavioral: Negative for agitation.       Objective:    BP 118/65 mmHg  Pulse 57  Temp(Src) 96.4 F (35.8 C) (Oral)  Ht 5\' 5"  (1.651 m)  Wt 206 lb (93.441 kg)  BMI 34.28 kg/m2 Physical Exam  Constitutional: He is oriented to person, place, and time. He appears well-developed and well-nourished.  Eyes: Pupils are equal, round, and reactive to light.  Cardiovascular: Normal rate and regular rhythm.   No murmur heard. Pulmonary/Chest: Effort normal and breath sounds normal.  Abdominal: There is no tenderness.  Neurological: He is alert and oriented to person, place, and time.  Skin: Skin is warm and dry.  SK lesion on back with dryness and scaling  Psychiatric: He has a normal mood and affect.          Assessment & Plan:     ICD-9-CM ICD-10-CM   1. Gastroesophageal reflux disease without esophagitis 530.81 K21.9 omeprazole (PRILOSEC) 20 MG capsule  2. Keratosis 701.1 L57.0 triamcinolone cream (KENALOG) 0.1 %     Return if symptoms worsen or fail to improve.  Lysbeth Penner FNP

## 2014-03-30 NOTE — Telephone Encounter (Signed)
Appointment scheduled for 12/2 with Sabra Heck

## 2014-04-07 NOTE — Addendum Note (Signed)
Addended by: Pollyann Kennedy F on: 04/07/2014 05:40 PM   Modules accepted: Orders

## 2014-04-08 ENCOUNTER — Other Ambulatory Visit: Payer: Self-pay | Admitting: Family Medicine

## 2014-04-09 ENCOUNTER — Other Ambulatory Visit: Payer: Medicare HMO

## 2014-04-09 DIAGNOSIS — E119 Type 2 diabetes mellitus without complications: Secondary | ICD-10-CM

## 2014-04-09 NOTE — Addendum Note (Signed)
Addended by: Pollyann Kennedy F on: 04/09/2014 05:19 PM   Modules accepted: Orders

## 2014-04-10 LAB — MICROALBUMIN, URINE: Microalbumin, Urine: 5.3 ug/mL (ref 0.0–17.0)

## 2014-04-29 ENCOUNTER — Ambulatory Visit: Payer: Medicare HMO | Admitting: Family Medicine

## 2014-05-01 ENCOUNTER — Other Ambulatory Visit (INDEPENDENT_AMBULATORY_CARE_PROVIDER_SITE_OTHER): Payer: Medicare HMO

## 2014-05-01 ENCOUNTER — Other Ambulatory Visit: Payer: Self-pay | Admitting: *Deleted

## 2014-05-01 DIAGNOSIS — E1165 Type 2 diabetes mellitus with hyperglycemia: Secondary | ICD-10-CM

## 2014-05-01 DIAGNOSIS — E785 Hyperlipidemia, unspecified: Secondary | ICD-10-CM

## 2014-05-01 DIAGNOSIS — IMO0002 Reserved for concepts with insufficient information to code with codable children: Secondary | ICD-10-CM

## 2014-05-01 DIAGNOSIS — I10 Essential (primary) hypertension: Secondary | ICD-10-CM

## 2014-05-01 DIAGNOSIS — E119 Type 2 diabetes mellitus without complications: Secondary | ICD-10-CM

## 2014-05-01 LAB — POCT GLYCOSYLATED HEMOGLOBIN (HGB A1C): Hemoglobin A1C: 6.8

## 2014-05-01 NOTE — Progress Notes (Signed)
Lab only 

## 2014-05-02 LAB — CMP14+EGFR
ALBUMIN: 3.7 g/dL (ref 3.5–4.7)
ALK PHOS: 67 IU/L (ref 39–117)
ALT: 18 IU/L (ref 0–44)
AST: 17 IU/L (ref 0–40)
Albumin/Globulin Ratio: 1.4 (ref 1.1–2.5)
BILIRUBIN TOTAL: 0.5 mg/dL (ref 0.0–1.2)
BUN/Creatinine Ratio: 12 (ref 10–22)
BUN: 17 mg/dL (ref 8–27)
CO2: 25 mmol/L (ref 18–29)
CREATININE: 1.39 mg/dL — AB (ref 0.76–1.27)
Calcium: 9 mg/dL (ref 8.6–10.2)
Chloride: 100 mmol/L (ref 97–108)
GFR calc non Af Amer: 46 mL/min/{1.73_m2} — ABNORMAL LOW (ref >59)
GFR, EST AFRICAN AMERICAN: 53 mL/min/{1.73_m2} — AB (ref >59)
GLOBULIN, TOTAL: 2.6 g/dL (ref 1.5–4.5)
Glucose: 131 mg/dL — ABNORMAL HIGH (ref 65–99)
Potassium: 4.1 mmol/L (ref 3.5–5.2)
Sodium: 140 mmol/L (ref 134–144)
Total Protein: 6.3 g/dL (ref 6.0–8.5)

## 2014-05-02 LAB — LIPID PANEL
Chol/HDL Ratio: 4 ratio units (ref 0.0–5.0)
Cholesterol, Total: 125 mg/dL (ref 100–199)
HDL: 31 mg/dL — ABNORMAL LOW (ref >39)
LDL CALC: 59 mg/dL (ref 0–99)
Triglycerides: 176 mg/dL — ABNORMAL HIGH (ref 0–149)
VLDL Cholesterol Cal: 35 mg/dL (ref 5–40)

## 2014-05-05 ENCOUNTER — Telehealth: Payer: Self-pay | Admitting: Family Medicine

## 2014-05-05 NOTE — Telephone Encounter (Signed)
Provider aware, needs to address social, environmental and health  Issues in detail at next visit per insurance request.

## 2014-06-08 ENCOUNTER — Other Ambulatory Visit: Payer: Self-pay | Admitting: Family Medicine

## 2014-06-15 ENCOUNTER — Encounter: Payer: Self-pay | Admitting: Family Medicine

## 2014-06-15 ENCOUNTER — Other Ambulatory Visit: Payer: Self-pay | Admitting: Family Medicine

## 2014-06-15 ENCOUNTER — Ambulatory Visit (INDEPENDENT_AMBULATORY_CARE_PROVIDER_SITE_OTHER): Payer: Medicare HMO | Admitting: Family Medicine

## 2014-06-15 ENCOUNTER — Encounter (HOSPITAL_COMMUNITY): Payer: Self-pay | Admitting: Cardiology

## 2014-06-15 ENCOUNTER — Observation Stay (HOSPITAL_COMMUNITY)
Admission: AD | Admit: 2014-06-15 | Discharge: 2014-06-16 | Disposition: A | Discharge status: Home / Self Care | Payer: Commercial Managed Care - HMO | Source: Ambulatory Visit | Attending: Internal Medicine | Admitting: Internal Medicine

## 2014-06-15 ENCOUNTER — Observation Stay (HOSPITAL_COMMUNITY): Payer: Commercial Managed Care - HMO

## 2014-06-15 ENCOUNTER — Ambulatory Visit: Payer: Medicare HMO

## 2014-06-15 VITALS — BP 137/70 | HR 69 | Temp 98.5°F | Ht 65.0 in | Wt 208.0 lb

## 2014-06-15 DIAGNOSIS — H9190 Unspecified hearing loss, unspecified ear: Secondary | ICD-10-CM | POA: Diagnosis not present

## 2014-06-15 DIAGNOSIS — E119 Type 2 diabetes mellitus without complications: Secondary | ICD-10-CM | POA: Diagnosis not present

## 2014-06-15 DIAGNOSIS — I351 Nonrheumatic aortic (valve) insufficiency: Secondary | ICD-10-CM | POA: Insufficient documentation

## 2014-06-15 DIAGNOSIS — R05 Cough: Secondary | ICD-10-CM

## 2014-06-15 DIAGNOSIS — I252 Old myocardial infarction: Secondary | ICD-10-CM

## 2014-06-15 DIAGNOSIS — J189 Pneumonia, unspecified organism: Secondary | ICD-10-CM | POA: Diagnosis not present

## 2014-06-15 DIAGNOSIS — Z7982 Long term (current) use of aspirin: Secondary | ICD-10-CM | POA: Insufficient documentation

## 2014-06-15 DIAGNOSIS — E039 Hypothyroidism, unspecified: Secondary | ICD-10-CM

## 2014-06-15 DIAGNOSIS — R0602 Shortness of breath: Secondary | ICD-10-CM | POA: Diagnosis not present

## 2014-06-15 DIAGNOSIS — I10 Essential (primary) hypertension: Secondary | ICD-10-CM | POA: Insufficient documentation

## 2014-06-15 DIAGNOSIS — I358 Other nonrheumatic aortic valve disorders: Secondary | ICD-10-CM | POA: Insufficient documentation

## 2014-06-15 DIAGNOSIS — R059 Cough, unspecified: Secondary | ICD-10-CM

## 2014-06-15 DIAGNOSIS — E785 Hyperlipidemia, unspecified: Secondary | ICD-10-CM | POA: Insufficient documentation

## 2014-06-15 DIAGNOSIS — K649 Unspecified hemorrhoids: Secondary | ICD-10-CM | POA: Insufficient documentation

## 2014-06-15 DIAGNOSIS — K5669 Other intestinal obstruction: Secondary | ICD-10-CM | POA: Insufficient documentation

## 2014-06-15 DIAGNOSIS — J209 Acute bronchitis, unspecified: Secondary | ICD-10-CM | POA: Diagnosis present

## 2014-06-15 DIAGNOSIS — K5790 Diverticulosis of intestine, part unspecified, without perforation or abscess without bleeding: Secondary | ICD-10-CM | POA: Insufficient documentation

## 2014-06-15 DIAGNOSIS — Z7952 Long term (current) use of systemic steroids: Secondary | ICD-10-CM | POA: Insufficient documentation

## 2014-06-15 DIAGNOSIS — J181 Lobar pneumonia, unspecified organism: Principal | ICD-10-CM

## 2014-06-15 DIAGNOSIS — K219 Gastro-esophageal reflux disease without esophagitis: Secondary | ICD-10-CM | POA: Diagnosis not present

## 2014-06-15 DIAGNOSIS — R54 Age-related physical debility: Secondary | ICD-10-CM

## 2014-06-15 DIAGNOSIS — Z79899 Other long term (current) drug therapy: Secondary | ICD-10-CM | POA: Diagnosis not present

## 2014-06-15 LAB — POCT CBC
GRANULOCYTE PERCENT: 81.1 % — AB (ref 37–80)
HCT, POC: 43.2 % — AB (ref 43.5–53.7)
Hemoglobin: 14 g/dL — AB (ref 14.1–18.1)
Lymph, poc: 1.7 (ref 0.6–3.4)
MCH, POC: 27.7 pg (ref 27–31.2)
MCHC: 32.4 g/dL (ref 31.8–35.4)
MCV: 85.5 fL (ref 80–97)
MPV: 7.4 fL (ref 0–99.8)
PLATELET COUNT, POC: 184 10*3/uL (ref 142–424)
POC Granulocyte: 8.8 — AB (ref 2–6.9)
POC LYMPH PERCENT: 15.8 %L (ref 10–50)
RBC: 5.1 M/uL (ref 4.69–6.13)
RDW, POC: 13.8 %
WBC: 10.9 10*3/uL — AB (ref 4.6–10.2)

## 2014-06-15 LAB — COMPREHENSIVE METABOLIC PANEL
ALT: 20 U/L (ref 0–53)
ANION GAP: 7 (ref 5–15)
AST: 19 U/L (ref 0–37)
Albumin: 3.5 g/dL (ref 3.5–5.2)
Alkaline Phosphatase: 66 U/L (ref 39–117)
BILIRUBIN TOTAL: 0.5 mg/dL (ref 0.3–1.2)
BUN: 17 mg/dL (ref 6–23)
CALCIUM: 8.7 mg/dL (ref 8.4–10.5)
CHLORIDE: 101 meq/L (ref 96–112)
CO2: 26 mmol/L (ref 19–32)
Creatinine, Ser: 1.37 mg/dL — ABNORMAL HIGH (ref 0.50–1.35)
GFR calc Af Amer: 52 mL/min — ABNORMAL LOW (ref >90)
GFR, EST NON AFRICAN AMERICAN: 45 mL/min — AB (ref >90)
Glucose, Bld: 164 mg/dL — ABNORMAL HIGH (ref 70–99)
Potassium: 3.6 mmol/L (ref 3.5–5.1)
Sodium: 134 mmol/L — ABNORMAL LOW (ref 135–145)
Total Protein: 6.8 g/dL (ref 6.0–8.3)

## 2014-06-15 LAB — CBC
HEMATOCRIT: 41.1 % (ref 39.0–52.0)
HEMOGLOBIN: 13.9 g/dL (ref 13.0–17.0)
MCH: 29.4 pg (ref 26.0–34.0)
MCHC: 33.8 g/dL (ref 30.0–36.0)
MCV: 87.1 fL (ref 78.0–100.0)
Platelets: 167 10*3/uL (ref 150–400)
RBC: 4.72 MIL/uL (ref 4.22–5.81)
RDW: 14 % (ref 11.5–15.5)
WBC: 9.1 10*3/uL (ref 4.0–10.5)

## 2014-06-15 LAB — GLUCOSE, CAPILLARY: GLUCOSE-CAPILLARY: 166 mg/dL — AB (ref 70–99)

## 2014-06-15 LAB — BRAIN NATRIURETIC PEPTIDE: B Natriuretic Peptide: 48 pg/mL (ref 0.0–100.0)

## 2014-06-15 MED ORDER — MECLIZINE HCL 12.5 MG PO TABS: 25.0000 mg | ORAL_TABLET | Freq: Three times a day (TID) | ORAL | Status: DC | PRN

## 2014-06-15 MED ORDER — SODIUM CHLORIDE 0.9 % IJ SOLN: 3.0000 mL | INTRAMUSCULAR | Status: DC | PRN

## 2014-06-15 MED ORDER — INSULIN ASPART 100 UNIT/ML ~~LOC~~ SOLN
0.0000 [IU] | Freq: Three times a day (TID) | SUBCUTANEOUS | Status: DC
  Administered 2014-06-16 (×2): 1 [IU] via SUBCUTANEOUS

## 2014-06-15 MED ORDER — ACETAMINOPHEN 325 MG PO TABS: 650.0000 mg | ORAL_TABLET | Freq: Four times a day (QID) | ORAL | Status: DC | PRN

## 2014-06-15 MED ORDER — SODIUM CHLORIDE 0.9 % IV SOLN: 250.0000 mL | INTRAVENOUS | Status: DC | PRN

## 2014-06-15 MED ORDER — LEVOTHYROXINE SODIUM 75 MCG PO TABS
75.0000 ug | ORAL_TABLET | Freq: Every day | ORAL | Status: DC
  Administered 2014-06-16: 75 ug via ORAL
  Filled 2014-06-15: qty 1

## 2014-06-15 MED ORDER — ONDANSETRON HCL 4 MG PO TABS: 4.0000 mg | ORAL_TABLET | Freq: Four times a day (QID) | ORAL | Status: DC | PRN

## 2014-06-15 MED ORDER — ASPIRIN 81 MG PO CHEW
81.0000 mg | CHEWABLE_TABLET | Freq: Every day | ORAL | Status: DC
  Administered 2014-06-15 – 2014-06-16 (×2): 81 mg via ORAL
  Filled 2014-06-15 (×2): qty 1

## 2014-06-15 MED ORDER — PANTOPRAZOLE SODIUM 40 MG PO TBEC
40.0000 mg | DELAYED_RELEASE_TABLET | Freq: Every day | ORAL | Status: DC
  Administered 2014-06-16: 40 mg via ORAL
  Filled 2014-06-15: qty 1

## 2014-06-15 MED ORDER — SODIUM CHLORIDE 0.9 % IJ SOLN
3.0000 mL | Freq: Two times a day (BID) | INTRAMUSCULAR | Status: DC
  Administered 2014-06-15 – 2014-06-16 (×2): 3 mL via INTRAVENOUS

## 2014-06-15 MED ORDER — ACETAMINOPHEN 650 MG RE SUPP: 650.0000 mg | Freq: Four times a day (QID) | RECTAL | Status: DC | PRN

## 2014-06-15 MED ORDER — ENOXAPARIN SODIUM 40 MG/0.4ML ~~LOC~~ SOLN
40.0000 mg | SUBCUTANEOUS | Status: DC
  Administered 2014-06-15: 40 mg via SUBCUTANEOUS
  Filled 2014-06-15: qty 0.4

## 2014-06-15 MED ORDER — AMLODIPINE BESYLATE 5 MG PO TABS
5.0000 mg | ORAL_TABLET | Freq: Every day | ORAL | Status: DC
  Administered 2014-06-16: 5 mg via ORAL
  Filled 2014-06-15: qty 1

## 2014-06-15 MED ORDER — BENAZEPRIL HCL 10 MG PO TABS
20.0000 mg | ORAL_TABLET | Freq: Every day | ORAL | Status: DC
  Administered 2014-06-16: 20 mg via ORAL
  Filled 2014-06-15: qty 2

## 2014-06-15 MED ORDER — AMLODIPINE BESY-BENAZEPRIL HCL 5-20 MG PO CAPS: 1.0000 | ORAL_CAPSULE | Freq: Every day | ORAL | Status: DC

## 2014-06-15 MED ORDER — ONDANSETRON HCL 4 MG/2ML IJ SOLN: 4.0000 mg | Freq: Four times a day (QID) | INTRAMUSCULAR | Status: DC | PRN

## 2014-06-15 MED ORDER — LEVOFLOXACIN IN D5W 750 MG/150ML IV SOLN
750.0000 mg | Freq: Once | INTRAVENOUS | Status: AC
  Administered 2014-06-15: 750 mg via INTRAVENOUS
  Filled 2014-06-15: qty 150

## 2014-06-15 NOTE — Patient Instructions (Signed)
Because of the patient's debilitated condition and his wheezing and rales he will be admitted to the hospital.

## 2014-06-15 NOTE — Progress Notes (Signed)
ANTIBIOTIC CONSULT NOTE - INITIAL  Pharmacy Consult for Levaquin Indication: pneumonia  No Known Allergies  Patient Measurements: Height: 5\' 5"  (165.1 cm) Weight: 208 lb (94.348 kg) IBW/kg (Calculated) : 61.5 Adjusted Body Weight:   Vital Signs: Temp: 98.5 F (36.9 C) (01/18 2041) Temp Source: Oral (01/18 2041) BP: 111/70 mmHg (01/18 2041) Pulse Rate: 72 (01/18 2041) Intake/Output from previous day:   Intake/Output from this shift:    Labs:  Recent Labs  06/15/14 1732  WBC 10.9*  HGB 14.0*   CrCl cannot be calculated (Patient has no serum creatinine result on file.). No results for input(s): VANCOTROUGH, VANCOPEAK, VANCORANDOM, GENTTROUGH, GENTPEAK, GENTRANDOM, TOBRATROUGH, TOBRAPEAK, TOBRARND, AMIKACINPEAK, AMIKACINTROU, AMIKACIN in the last 72 hours.   Microbiology: No results found for this or any previous visit (from the past 720 hour(s)).  Medical History: Past Medical History  Diagnosis Date  . Diabetes mellitus     Diet control   . Hypertension   . History of pneumonia   . Hemorrhoids   . MI (myocardial infarction) 1996    percutaneous coronary intervention   . GERD (gastroesophageal reflux disease)   . Hypothyroidism   . Diverticulosis   . Aortic insufficiency   . Aortic valve sclerosis   . Dyslipidemia   . Hard of hearing   . Small bowel obstruction     Medications:  Scheduled:  . [START ON 06/16/2014] amLODipine  5 mg Oral Daily   And  . [START ON 06/16/2014] benazepril  20 mg Oral Daily  . aspirin  81 mg Oral Daily  . enoxaparin (LOVENOX) injection  40 mg Subcutaneous Q24H  . [START ON 06/16/2014] insulin aspart  0-9 Units Subcutaneous TID WC  . levofloxacin (LEVAQUIN) IV  750 mg Intravenous Once  . [START ON 06/16/2014] levothyroxine  75 mcg Oral QAC breakfast  . [START ON 06/16/2014] pantoprazole  40 mg Oral Daily  . sodium chloride  3 mL Intravenous Q12H   Assessment: Acute bronchitis versus pneumonia Start the patient on Levaquin per  pharmacy  CMET scheduled for AM  Goal of Therapy:  Eradicate infection  Plan:  Levaquin 750 mg IV x 1 dose tonight F/U additional dosing when morning labs available   Abner Greenspan, Tanyika Barros Bennett 06/15/2014,9:52 PM

## 2014-06-15 NOTE — H&P (Signed)
PCP:   Wardell Honour, MD   Chief Complaint:  Cough  HPI:  81 year old male who  has a past medical history of Diabetes mellitus; Hypertension; History of pneumonia; Hemorrhoids; MI (myocardial infarction) (1996); GERD (gastroesophageal reflux disease); Hypothyroidism; Diverticulosis; Aortic insufficiency; Aortic valve sclerosis; Dyslipidemia; Hard of hearing; and Small bowel obstruction. [facility] with chief complaint of chest congestion cough. Also complained of productive green-colored sputum. Patient was sent to the [facility] for possible pneumonia. Old healed patient denies shortness of breath, no chest pain. Has been having coughing with productive phlegm for past 3 days. He denies nausea vomiting or diarrhea. No constipation. Patient has history of old MI but no recent chest pain. He denies fever or chills Denies dysuria  Allergies:  No Known Allergies    Past Medical History  Diagnosis Date  . Diabetes mellitus     Diet control   . Hypertension   . History of pneumonia   . Hemorrhoids   . MI (myocardial infarction) 1996    percutaneous coronary intervention   . GERD (gastroesophageal reflux disease)   . Hypothyroidism   . Diverticulosis   . Aortic insufficiency   . Aortic valve sclerosis   . Dyslipidemia   . Hard of hearing   . Small bowel obstruction     Past Surgical History  Procedure Laterality Date  . Exploratory laparotomy w/ bowel resection      ventral hernia repair  . Transurethral resection of prostate    . Cholecystectomy    . Colonoscopy  2008  . Abdominal hernia repair      multiple sx's  . Cataract extraction      left eye    Prior to Admission medications   Medication Sig Start Date End Date Taking? Authorizing Provider  amLODipine-benazepril (LOTREL) 5-20 MG per capsule TAKE (1) CAPSULE DAILY 06/09/14   Lysbeth Penner, FNP  aspirin 81 MG tablet Take 81 mg by mouth daily.     Historical Provider, MD  clobetasol cream (TEMOVATE) 0.05 % Apply topically 2 (two) times daily. 12/05/12   Vernie Shanks, MD  furosemide (LASIX) 40 MG tablet TAKE 1 TABLET DAILY 06/09/14   Lysbeth Penner, FNP  glucose blood test strip Check blood sugar bid and prn 01/31/13   Vernie Shanks, MD  JANUVIA 50 MG tablet TAKE 1 TABLET DAILY 06/09/14   Lysbeth Penner, FNP  ketoconazole (NIZORAL) 2 % cream Apply topically 2 (two) times daily. 12/05/12   Vernie Shanks, MD  Lancets (ACCU-CHEK MULTICLIX) lancets Use as instructed- check Blood sugar bid and prn 01/31/13   Vernie Shanks, MD  levothyroxine (SYNTHROID, LEVOTHROID) 75 MCG tablet Take 1 tablet (75 mcg total) by mouth daily. 12/15/13   Chipper Herb, MD  meclizine (ANTIVERT) 25 MG tablet Take 1 tablet (25 mg total) by mouth 3 (three) times daily as needed for dizziness. 07/07/13   Vernie Shanks, MD  Melatonin 3 MG TABS Take 3 mg by mouth at bedtime as needed.    Historical Provider, MD  metoprolol succinate (TOPROL-XL) 50 MG 24 hr tablet TAKE 1 TABLET ONCE A DAY WITH A MEAL 06/09/14   Lysbeth Penner, FNP  Zack Seal Laxative 64.8-324 MG TABS Take 2 tablets by mouth at bedtime.     Historical Provider, MD  Omega-3 Fatty Acids (FISH OIL) 1000 MG CAPS Take 1 capsule by mouth daily.    Historical Provider, MD  omeprazole (PRILOSEC) 20 MG capsule Take 1 capsule (20 mg total) by mouth daily. 03/30/14   Lysbeth Penner, FNP  simvastatin (ZOCOR) 20 MG tablet TAKE 1 TABLET DAILY 06/09/14   Lysbeth Penner, FNP  traMADol (ULTRAM) 50 MG tablet Take 1 tablet (50 mg total) by mouth every 8 (eight) hours as needed. 07/04/13   Vernie Shanks, MD  triamcinolone cream (KENALOG) 0.1 % Apply topically 2 (two) times daily. 11/25/12   Lysbeth Penner, FNP  triamcinolone cream (KENALOG) 0.1 % Apply 1 application topically 2 (two) times daily. 03/30/14   Lysbeth Penner, FNP    Social History:  reports that he quit smoking about 71 years ago. He has never used  smokeless tobacco. He reports that he does not drink alcohol or use illicit drugs.  Family History  Problem Relation Age of Onset  . Colon cancer Neg Hx      All the positives are listed in BOLD  Review of Systems:  HEENT: Headache, blurred vision, runny nose, sore throat Neck: Hypothyroidism, hyperthyroidism,,lymphadenopathy Chest : Shortness of breath, history of COPD, Asthma, cough Heart : Chest pain, history of coronary arterey disease GI:  Nausea, vomiting, diarrhea, constipation, GERD GU: Dysuria, urgency, frequency of urination, hematuria Neuro: Stroke, seizures, syncope Psych: Depression, anxiety, hallucinations   Physical Exam: Blood pressure 111/70, pulse 72, temperature 98.5 F (36.9 C), temperature source Oral, height 5\' 5"  (1.651 m), weight 94.348 kg (208 lb), SpO2 95 %. Constitutional:   Patient is a well-developed and well-nourished male* in no acute distress and cooperative with exam. Head: Normocephalic and atraumatic Mouth: Mucus membranes moist Eyes: PERRL, EOMI, conjunctivae normal Neck: Supple, No Thyromegaly Cardiovascular: RRR, S1 normal, S2 normal Pulmonary/Chest: Bibasilar crackles Abdominal: Soft. Non-tender, non-distended, bowel sounds are normal, no masses, organomegaly, or guarding present.  Neurological: A&O x3, Strength is normal and symmetric bilaterally, cranial nerve II-XII are grossly intact, no focal motor deficit, sensory intact to light touch bilaterally.  Extremities : No Cyanosis, Clubbing or Edema  Labs on Admission:  Basic Metabolic Panel: No results for input(s): NA, K, CL, CO2, GLUCOSE, BUN, CREATININE, CALCIUM, MG, PHOS in the last 168 hours. Liver Function Tests: No results for input(s): AST, ALT, ALKPHOS, BILITOT, PROT, ALBUMIN in the last 168 hours. No results for input(s): LIPASE, AMYLASE in the last 168 hours. No results for input(s): AMMONIA in the last 168 hours. CBC:  Recent Labs Lab 06/15/14 1732  WBC 10.9*  HGB  14.0*  HCT 43.2*  MCV 85.5   Cardiac Enzymes: No results for input(s): CKTOTAL, CKMB, CKMBINDEX, TROPONINI in the last 168 hours.  BNP (last 3 results) No results for input(s): PROBNP in the last 8760 hours. CBG: No results for input(s): GLUCAP in the last 168 hours.  Radiological Exams on Admission: No results found.     Assessment/Plan Active Problems:   Essential hypertension   Old myocardial infarction   Hypothyroid   Acute bronchitis  Acute bronchitis versus pneumonia Clinical picture not consistent with pneumonia at this time. Will obtain chest x-ray PA and lateral. Start the patient on Levaquin per pharmacy consultation. Check blood cultures 2, urinary strep pneumo antigen, Legionella antigen.  Bibasilar crackles Patient has a history of CHF/old MI, will check BNP and 2-D echocardiogram. Patient takes Lasix 40 g at home which I will hold at this time due to soft blood pressures  History of hypertension Will hold the antihypertensive medication at this time including Lasix due to soft blood pressure. Once patient  blood pressure improves these medications can be restarted. We'll also check complete metabolic profile to assess patient's kidney functions  History of hypothyroidism Continue Synthroid  DVT prophylaxis Lovenox  Code status: Patient is full code  Family discussion: Admission, patients condition and plan of care including tests being ordered have been discussed with the patient and his daughter at bedside** who indicate understanding and agree with the plan and Code Status.   Time Spent on Admission: 60 minutes  [facility] Pager: [phone] 06/15/2014, 9:05 PM  If 7PM-7AM, please contact night-coverage  [email or web address]  Password [email or web address]

## 2014-06-15 NOTE — Progress Notes (Signed)
Subjective:    Patient ID: [name], male    DOB: 23-Sep-1924, 81 y.o.   MRN: [id number]  HPI Pt is here today with head and chest congestion that started on  Sunday.  He has ear pain and a productive cough   Review of Systems  Constitutional: Positive for fever.  Respiratory: Positive for cough (prod. green) and wheezing.   Cardiovascular: Negative.   Neurological: Negative for dizziness and headaches.       Objective:   Physical Exam  Constitutional: He is oriented to person, place, and time. He appears distressed.  The patient is kyphotic and cannot hear well. He is seems to be in some discomfort.  HENT:  Head: Normocephalic and atraumatic.  Mouth/Throat: Oropharynx is clear and moist.  There is nasal congestion bilaterally .He has bilateral ears cerumen  Eyes: Conjunctivae and EOM are normal. Pupils are equal, round, and reactive to light. Right eye exhibits no discharge. Left eye exhibits no discharge.  Neck: Normal range of motion. No thyromegaly present.  Cardiovascular: Normal rate, regular rhythm and normal heart sounds.   No murmur heard. Pulmonary/Chest: Effort normal. He has wheezes. He has rales.  The patient has some basilar rales bilaterally right greater than left with expiratory wheezes  Abdominal: Soft. He exhibits distension.  Musculoskeletal:  The patient is slow with his movements he has severe kyphosis. The patient is weak.  Lymphadenopathy:    He has no cervical adenopathy.  Neurological: He is alert and oriented to person, place, and time.  The patient cannot hear well and his sense of alertness is diminished..  Skin: Skin is warm and dry. No rash noted.  Psychiatric:  This is difficult to discern secondary to his inability to hear well.  Nursing note and vitals reviewed.  BP 137/70 mmHg  Pulse 69  Temp(Src) 98.5 F (36.9 C) (Oral)  Ht 5' 5"  (1.651 m)  Wt 208 lb (94.348 kg)  BMI 34.61 kg/m2  Results for orders placed or performed in  visit on 06/15/14  POCT CBC  Result Value Ref Range   WBC 10.9 (A) 4.6 - 10.2 K/uL   Lymph, poc 1.7 0.6 - 3.4   POC LYMPH PERCENT 15.8 10 - 50 %L   POC Granulocyte 8.8 (A) 2 - 6.9   Granulocyte percent 81.1 (A) 37 - 80 %G   RBC 5.1 4.69 - 6.13 M/uL   Hemoglobin 14.0 (A) 14.1 - 18.1 g/dL   HCT, POC 43.2 (A) 43.5 - 53.7 %   MCV 85.5 80 - 97 fL   MCH, POC 27.7 27 - 31.2 pg   MCHC 32.4 31.8 - 35.4 g/dL   RDW, POC 13.8 %   Platelet Count, POC 184.0 142 - 424 K/uL   MPV 7.4 0 - 99.8 fL   [facility] reading (PRIMARY) by  Dr. Brunilda Payor x-ray-kyphotic appearance but no active disease observed, but difficult  to read. On auscultation the patient has rales bilaterally right greater than left in the base                                Pulse ox 97%      Assessment & Plan:   1. Cough - POCT CBC - BMP8+EGFR  2. Right lower lobe pneumonia -By auscultation  3. Diabetes mellitus without complication  4. Shortness of breath  5. Frailty   Patient Instructions  Because of the patient's debilitated condition and his  wheezing and rales he will be admitted to the hospital.    Arrie Senate MD

## 2014-06-16 ENCOUNTER — Telehealth: Payer: Self-pay | Admitting: Family Medicine

## 2014-06-16 ENCOUNTER — Telehealth: Payer: Self-pay

## 2014-06-16 DIAGNOSIS — I10 Essential (primary) hypertension: Secondary | ICD-10-CM | POA: Diagnosis not present

## 2014-06-16 DIAGNOSIS — K219 Gastro-esophageal reflux disease without esophagitis: Secondary | ICD-10-CM | POA: Diagnosis not present

## 2014-06-16 DIAGNOSIS — I351 Nonrheumatic aortic (valve) insufficiency: Secondary | ICD-10-CM | POA: Diagnosis not present

## 2014-06-16 DIAGNOSIS — E039 Hypothyroidism, unspecified: Secondary | ICD-10-CM | POA: Diagnosis not present

## 2014-06-16 DIAGNOSIS — J189 Pneumonia, unspecified organism: Secondary | ICD-10-CM | POA: Diagnosis not present

## 2014-06-16 DIAGNOSIS — I252 Old myocardial infarction: Secondary | ICD-10-CM | POA: Diagnosis not present

## 2014-06-16 DIAGNOSIS — J209 Acute bronchitis, unspecified: Secondary | ICD-10-CM | POA: Diagnosis not present

## 2014-06-16 DIAGNOSIS — K5790 Diverticulosis of intestine, part unspecified, without perforation or abscess without bleeding: Secondary | ICD-10-CM | POA: Diagnosis not present

## 2014-06-16 DIAGNOSIS — E119 Type 2 diabetes mellitus without complications: Secondary | ICD-10-CM | POA: Diagnosis not present

## 2014-06-16 DIAGNOSIS — K649 Unspecified hemorrhoids: Secondary | ICD-10-CM | POA: Diagnosis not present

## 2014-06-16 LAB — BMP8+EGFR
BUN / CREAT RATIO: 11 (ref 10–22)
BUN: 15 mg/dL (ref 8–27)
CALCIUM: 8.6 mg/dL (ref 8.6–10.2)
CO2: 26 mmol/L (ref 18–29)
CREATININE: 1.4 mg/dL — AB (ref 0.76–1.27)
Chloride: 97 mmol/L (ref 97–108)
GFR calc Af Amer: 52 mL/min/{1.73_m2} — ABNORMAL LOW (ref >59)
GFR, EST NON AFRICAN AMERICAN: 45 mL/min/{1.73_m2} — AB (ref >59)
Glucose: 186 mg/dL — ABNORMAL HIGH (ref 65–99)
Potassium: 3.8 mmol/L (ref 3.5–5.2)
SODIUM: 137 mmol/L (ref 134–144)

## 2014-06-16 LAB — CBC
HEMATOCRIT: 38.2 % — AB (ref 39.0–52.0)
Hemoglobin: 12.8 g/dL — ABNORMAL LOW (ref 13.0–17.0)
MCH: 29.2 pg (ref 26.0–34.0)
MCHC: 33.5 g/dL (ref 30.0–36.0)
MCV: 87 fL (ref 78.0–100.0)
Platelets: 157 10*3/uL (ref 150–400)
RBC: 4.39 MIL/uL (ref 4.22–5.81)
RDW: 14.1 % (ref 11.5–15.5)
WBC: 7.8 10*3/uL (ref 4.0–10.5)

## 2014-06-16 LAB — COMPREHENSIVE METABOLIC PANEL
ALK PHOS: 57 U/L (ref 39–117)
ALT: 18 U/L (ref 0–53)
AST: 17 U/L (ref 0–37)
Albumin: 3.1 g/dL — ABNORMAL LOW (ref 3.5–5.2)
Anion gap: 6 (ref 5–15)
BILIRUBIN TOTAL: 0.5 mg/dL (ref 0.3–1.2)
BUN: 17 mg/dL (ref 6–23)
CO2: 27 mmol/L (ref 19–32)
CREATININE: 1.22 mg/dL (ref 0.50–1.35)
Calcium: 8.5 mg/dL (ref 8.4–10.5)
Chloride: 104 mEq/L (ref 96–112)
GFR, EST AFRICAN AMERICAN: 60 mL/min — AB (ref >90)
GFR, EST NON AFRICAN AMERICAN: 51 mL/min — AB (ref >90)
GLUCOSE: 129 mg/dL — AB (ref 70–99)
Potassium: 3.3 mmol/L — ABNORMAL LOW (ref 3.5–5.1)
Sodium: 137 mmol/L (ref 135–145)
Total Protein: 6 g/dL (ref 6.0–8.3)

## 2014-06-16 LAB — GLUCOSE, CAPILLARY
Glucose-Capillary: 131 mg/dL — ABNORMAL HIGH (ref 70–99)
Glucose-Capillary: 122 mg/dL — ABNORMAL HIGH (ref 70–99)

## 2014-06-16 LAB — STREP PNEUMONIAE URINARY ANTIGEN: Strep Pneumo Urinary Antigen: NEGATIVE

## 2014-06-16 MED ORDER — LEVOFLOXACIN 750 MG PO TABS: 750.0000 mg | ORAL_TABLET | ORAL | Status: DC

## 2014-06-16 MED ORDER — POTASSIUM CHLORIDE CRYS ER 20 MEQ PO TBCR
40.0000 meq | EXTENDED_RELEASE_TABLET | Freq: Once | ORAL | Status: AC
  Administered 2014-06-16: 40 meq via ORAL
  Filled 2014-06-16: qty 2

## 2014-06-16 MED ORDER — LEVOFLOXACIN IN D5W 750 MG/150ML IV SOLN: 750.0000 mg | INTRAVENOUS | Status: DC

## 2014-06-16 NOTE — Telephone Encounter (Signed)
Pt given 2 week hospital follow up appt for bronchitis with dr Sabra Heck 07/01/14 @ 10:30.

## 2014-06-16 NOTE — Discharge Summary (Signed)
Physician Discharge Summary  [name] YYT:[id number] DOB: Oct 17, 1924 DOA: 06/15/2014  PCP: Wardell Honour, MD  Admit date: 06/15/2014 Discharge date: 06/16/2014  Time spent: 45 minutes  Recommendations for Outpatient Follow-up:  -Will be discharged home today. -Advised to follow up with PCP in 2 weeks.   Discharge Diagnoses:  Active Problems:   Essential hypertension   Old myocardial infarction   Hypothyroid   Acute bronchitis   Discharge Condition: Stable and improved  Filed Weights   06/15/14 2041  Weight: 94.348 kg (208 lb)    History of present illness:  [facility] with chief complaint of chest congestion cough. Also complained of productive green-colored sputum. Patient was sent to the [facility] for possible pneumonia. Old healed patient denies shortness of breath, no chest pain. Has been having coughing with productive phlegm for past 3 days. He denies nausea vomiting or diarrhea. No constipation. Patient has history of old MI but no recent chest pain. He denies fever or chills Denies dysuria  Hospital Course:   Cough/Congestion -CXR without PNA. -Possibly viral URI vs acute bronchitis. -Will be discharged on 4 days of levaquin and continued symptomatic management.  Rest of chronic medical conditions have been stable and home medications have not been changed.  Procedures:  None   Consultations:  None  Discharge Instructions  Discharge Instructions    Diet - low sodium heart healthy    Complete by:  As directed      Increase activity slowly    Complete by:  As directed             Medication List    TAKE these medications        accu-chek multiclix lancets  Use as instructed- check Blood sugar bid and prn     amLODipine-benazepril 5-20 MG per capsule  Commonly known as:  LOTREL  TAKE (1) CAPSULE DAILY     aspirin 81 MG tablet  Take 81 mg by mouth daily.     Fish Oil 1000 MG  Caps  Take 1 capsule by mouth daily.     furosemide 40 MG tablet  Commonly known as:  LASIX  TAKE 1 TABLET DAILY     glucose blood test strip  Check blood sugar bid and prn     JANUVIA 50 MG tablet  Generic drug:  sitaGLIPtin  TAKE 1 TABLET DAILY     levofloxacin 750 MG tablet  Commonly known as:  LEVAQUIN  Take 1 tablet (750 mg total) by mouth every other day.  Start taking on:  06/17/2014     levothyroxine 75 MCG tablet  Commonly known as:  SYNTHROID, LEVOTHROID  Take 1 tablet (75 mcg total) by mouth daily.     Melatonin 3 MG Tabs  Take 3 mg by mouth at bedtime as needed (sleep).     metoprolol succinate 50 MG 24 hr tablet  Commonly known as:  TOPROL-XL  TAKE 1 TABLET ONCE A DAY WITH A MEAL     NATURAL SENNA LAXATIVE 64.8-324 MG Tabs  Take 2 tablets by mouth at bedtime.     omeprazole 20 MG capsule  Commonly known as:  PRILOSEC  Take 1 capsule (20 mg total) by mouth daily.     simvastatin 20 MG tablet  Commonly known as:  ZOCOR  TAKE 1 TABLET DAILY       No Known Allergies     Follow-up Information    Follow  up with Wardell Honour, MD. Schedule an appointment as soon as possible for a visit in 2 weeks.   Specialty:  Family Medicine   Contact information:   [place] [address] [phone]        The results of significant diagnostics from this hospitalization (including imaging, microbiology, ancillary and laboratory) are listed below for reference.    Significant Diagnostic Studies: Dg Chest 2 View  06/15/2014   CLINICAL DATA:  Cough  EXAM: CHEST  2 VIEW  COMPARISON:  08/10/2010  FINDINGS: Cardiac silhouette normal in size and configuration. Aorta is mildly uncoiled. No mediastinal or hilar masses or evidence of adenopathy.  No lung consolidation or edema. No pleural effusion or pneumothorax.  Bony thorax is demineralized but intact.  No significant change from the prior study.  IMPRESSION: No acute cardiopulmonary disease.    Electronically Signed   By: Lajean Manes M.D.   On: 06/15/2014 21:53    Microbiology: Recent Results (from the past 240 hour(s))  Culture, blood (routine x 2)     Status: None (Preliminary result)   Collection Time: 06/15/14  9:34 PM  Result Value Ref Range Status   Specimen Description RIGHT ANTECUBITAL  Final   Special Requests BOTTLES DRAWN AEROBIC AND ANAEROBIC [place]  Final   Culture NO GROWTH 1 DAY  Final   Report Status PENDING  Incomplete  Culture, blood (routine x 2)     Status: None (Preliminary result)   Collection Time: 06/15/14  9:35 PM  Result Value Ref Range Status   Specimen Description BLOOD LEFT ARM  Final   Special Requests BOTTLES DRAWN AEROBIC AND ANAEROBIC 8CC EACH  Final   Culture NO GROWTH 1 DAY  Final   Report Status PENDING  Incomplete     Labs: Basic Metabolic Panel:  Recent Labs Lab 06/15/14 1719 06/15/14 2135 06/16/14 0546  NA 137 134* 137  K 3.8 3.6 3.3*  CL 97 101 104  CO2 26 26 27   GLUCOSE 186* 164* 129*  BUN 15 17 17   CREATININE 1.40* 1.37* 1.22  CALCIUM 8.6 8.7 8.5   Liver Function Tests:  Recent Labs Lab 06/15/14 2135 06/16/14 0546  AST 19 17  ALT 20 18  ALKPHOS 66 57  BILITOT 0.5 0.5  PROT 6.8 6.0  ALBUMIN 3.5 3.1*   No results for input(s): LIPASE, AMYLASE in the last 168 hours. No results for input(s): AMMONIA in the last 168 hours. CBC:  Recent Labs Lab 06/15/14 1732 06/15/14 2135 06/16/14 0546  WBC 10.9* 9.1 7.8  HGB 14.0* 13.9 12.8*  HCT 43.2* 41.1 38.2*  MCV 85.5 87.1 87.0  PLT  --  167 157   Cardiac Enzymes: No results for input(s): CKTOTAL, CKMB, CKMBINDEX, TROPONINI in the last 168 hours. BNP: BNP (last 3 results) No results for input(s): PROBNP in the last 8760 hours. CBG:  Recent Labs Lab 06/15/14 2139 06/16/14 0715 06/16/14 1118  GLUCAP 166* 131* 122*       Signed:  HERNANDEZ ACOSTA,ESTELA  [facility] Pager: [phone] 06/16/2014, 3:34 PM

## 2014-06-16 NOTE — Progress Notes (Signed)
ANTIBIOTIC CONSULT NOTE   Pharmacy Consult for Levaquin Indication: pneumonia  No Known Allergies  Patient Measurements: Height: 5\' 5"  (165.1 cm) Weight: 208 lb (94.348 kg) IBW/kg (Calculated) : 61.5  Vital Signs: Temp: 98.9 F (37.2 C) (01/19 0526) Temp Source: Oral (01/19 0526) BP: 114/44 mmHg (01/19 0526) Pulse Rate: 70 (01/19 0526) Intake/Output from previous day: 01/18 0701 - 01/19 0700 In: 150 [IV Piggyback:150] Out: -  Intake/Output from this shift: Total I/O In: -  Out: 400 [Urine:400]  Labs:  Recent Labs  06/15/14 1719 06/15/14 1732 06/15/14 2135 06/16/14 0546  WBC  --  10.9* 9.1 7.8  HGB  --  14.0* 13.9 12.8*  PLT  --   --  167 157  CREATININE 1.40*  --  1.37* 1.22   Estimated Creatinine Clearance: 45 mL/min (by C-G formula based on Cr of 1.22). No results for input(s): VANCOTROUGH, VANCOPEAK, VANCORANDOM, GENTTROUGH, GENTPEAK, GENTRANDOM, TOBRATROUGH, TOBRAPEAK, TOBRARND, AMIKACINPEAK, AMIKACINTROU, AMIKACIN in the last 72 hours.   Microbiology: Recent Results (from the past 720 hour(s))  Culture, blood (routine x 2)     Status: None (Preliminary result)   Collection Time: 06/15/14  9:34 PM  Result Value Ref Range Status   Specimen Description RIGHT ANTECUBITAL  Final   Special Requests BOTTLES DRAWN AEROBIC AND ANAEROBIC 8CC EACH  Final   Culture PENDING  Incomplete   Report Status PENDING  Incomplete  Culture, blood (routine x 2)     Status: None (Preliminary result)   Collection Time: 06/15/14  9:35 PM  Result Value Ref Range Status   Specimen Description BLOOD LEFT ARM  Final   Special Requests BOTTLES DRAWN AEROBIC AND ANAEROBIC 8CC EACH  Final   Culture PENDING  Incomplete   Report Status PENDING  Incomplete    Anti-infectives    Start     Dose/Rate Route Frequency Ordered Stop   06/17/14 2200  levofloxacin (LEVAQUIN) IVPB 750 mg     750 mg100 mL/hr over 90 Minutes Intravenous Every 48 hours 06/16/14 0805     06/15/14 2300   levofloxacin (LEVAQUIN) IVPB 750 mg     750 mg100 mL/hr over 90 Minutes Intravenous  Once 06/15/14 2151 06/16/14 0033      Assessment: 77 yoM who presented with productive cough & chest congestion.  Levaquin initiated on admission.  He is afebrile with normal WBC.  CXR did not reveal pneumonia.  Estimated CrCl<22ml/min.  Goal of Therapy:  Eradicate infection.  Plan:  Levaquin 750mg  PO q48h -next dose 1/20 at 2200 Monitor renal function and cx data  Duration of therapy per MD  Biagio Borg 06/16/2014,8:07 AM

## 2014-06-16 NOTE — Progress Notes (Signed)
Reviewed discharge instructions and prescription with pt and family.  Answered questions.  IV removed, pt tolerated well.  Pt transported to lobby to d/c with family via wheelchair by  Grayland Ormond, Patient Advocate.

## 2014-06-16 NOTE — Care Management Note (Signed)
    Page 1 of 1   06/16/2014     1:12:27 PM CARE MANAGEMENT NOTE 06/16/2014  Patient:  [name], [name]   Account Number:  [id number]  Date Initiated:  06/16/2014  Documentation initiated by:  Jolene Provost  Subjective/Objective Assessment:   Pt admitted with bronchitis. Pt is from home, lives with wife and son. Pt is independent at home. Pt has cane and walker but refuses to use them. Pt has CAP aids that come 6 days a week.     Action/Plan:   Pt plans to discharge home with resumption of CAP aid services. Pt has no CM needs at this time.   Anticipated DC Date:  06/16/2014   Anticipated DC Plan:  [place]  CM consult      Choice offered to / List presented to:             Status of service:  Completed, signed off Medicare Important Message given?   (If response is "NO", the following Medicare IM given date fields will be blank) Date Medicare IM given:   Medicare IM given by:   Date Additional Medicare IM given:   Additional Medicare IM given by:    Discharge Disposition:  HOME/SELF CARE  Per UR Regulation:    If discussed at Long Length of Stay Meetings, dates discussed:    Comments:  06/16/2014 [place], RN, MSN, [facility]

## 2014-06-17 LAB — LEGIONELLA ANTIGEN, URINE

## 2014-06-19 ENCOUNTER — Ambulatory Visit: Payer: Medicare HMO | Admitting: Family Medicine

## 2014-06-20 LAB — CULTURE, BLOOD (ROUTINE X 2)
CULTURE: NO GROWTH
Culture: NO GROWTH

## 2014-06-23 NOTE — Telephone Encounter (Signed)
close

## 2014-07-01 ENCOUNTER — Ambulatory Visit: Payer: Medicare HMO | Admitting: Family Medicine

## 2014-07-01 ENCOUNTER — Telehealth: Payer: Self-pay | Admitting: Family Medicine

## 2014-07-01 NOTE — Telephone Encounter (Signed)
This is okay to refill the nebulizer solution as needed

## 2014-07-02 MED ORDER — ALBUTEROL SULFATE (2.5 MG/3ML) 0.083% IN NEBU: 2.5000 mg | INHALATION_SOLUTION | Freq: Four times a day (QID) | RESPIRATORY_TRACT | Status: AC | PRN

## 2014-07-02 NOTE — Telephone Encounter (Signed)
He may refill his albuterol for his nebulizer and let patient know

## 2014-07-02 NOTE — Telephone Encounter (Signed)
Not on med list

## 2014-07-02 NOTE — Telephone Encounter (Signed)
Patient is requesting a refill on albuterol for his nebulizer. This is not on patients medication list is it ok to fill

## 2014-07-02 NOTE — Telephone Encounter (Signed)
Daughter does not know name of med - she will call me back with info shortly.

## 2014-07-30 ENCOUNTER — Telehealth: Payer: Self-pay | Admitting: Family Medicine

## 2014-07-30 ENCOUNTER — Other Ambulatory Visit: Payer: Self-pay | Admitting: Family Medicine

## 2014-07-30 NOTE — Telephone Encounter (Signed)
Patient will drink a cup of coffee and a glass of milk per day. He will have a can of soda on most days. He doesn't really drink water. He does eat fresh fruits and vegetables. He is urinating normally.  Advised that water intake doesn't have to only be from drinking and that the fresh fruits and vegetables will supply him with water as well. Encourage fruits and vegetables with high water content.  Offer a cup of water every few hours but do not force fluids.  Daughter stated understanding and agreement to plan. She will call back with any questions or concerns.

## 2014-09-18 ENCOUNTER — Other Ambulatory Visit: Payer: Self-pay | Admitting: Family Medicine

## 2014-10-12 ENCOUNTER — Other Ambulatory Visit: Payer: Self-pay | Admitting: Family Medicine

## 2014-10-14 ENCOUNTER — Telehealth: Payer: Self-pay | Admitting: Family Medicine

## 2014-10-16 ENCOUNTER — Other Ambulatory Visit: Payer: Self-pay | Admitting: Family Medicine

## 2014-10-19 ENCOUNTER — Other Ambulatory Visit: Payer: Self-pay | Admitting: *Deleted

## 2014-10-19 MED ORDER — GLUCOSE BLOOD VI STRP: ORAL_STRIP | Status: DC

## 2014-10-19 MED ORDER — BLOOD GLUCOSE MONITOR KIT: PACK | Status: DC

## 2014-10-19 MED ORDER — LANCETS MISC: Status: DC

## 2014-10-19 NOTE — Telephone Encounter (Signed)
Done

## 2014-10-20 DIAGNOSIS — J209 Acute bronchitis, unspecified: Secondary | ICD-10-CM | POA: Diagnosis not present

## 2014-10-20 DIAGNOSIS — J189 Pneumonia, unspecified organism: Secondary | ICD-10-CM | POA: Diagnosis not present

## 2014-10-30 ENCOUNTER — Telehealth: Payer: Self-pay | Admitting: Family Medicine

## 2014-11-09 ENCOUNTER — Other Ambulatory Visit: Payer: Self-pay | Admitting: Family Medicine

## 2014-11-10 NOTE — Telephone Encounter (Signed)
Last seen 11/15 B [place]

## 2014-11-10 NOTE — Telephone Encounter (Signed)
Last seen 03/30/14 B [place]  Last thyroid level 09/05/13

## 2014-11-17 ENCOUNTER — Other Ambulatory Visit: Payer: Self-pay

## 2014-11-17 MED ORDER — FUROSEMIDE 40 MG PO TABS: 40.0000 mg | ORAL_TABLET | Freq: Every day | ORAL | Status: DC

## 2014-11-17 MED ORDER — AMLODIPINE BESY-BENAZEPRIL HCL 5-20 MG PO CAPS: ORAL_CAPSULE | ORAL | Status: DC

## 2014-11-17 MED ORDER — SIMVASTATIN 20 MG PO TABS: 20.0000 mg | ORAL_TABLET | Freq: Every day | ORAL | Status: DC

## 2014-11-17 MED ORDER — METOPROLOL SUCCINATE ER 50 MG PO TB24: ORAL_TABLET | ORAL | Status: DC

## 2014-12-07 ENCOUNTER — Other Ambulatory Visit: Payer: Self-pay | Admitting: Family Medicine

## 2014-12-07 NOTE — Telephone Encounter (Signed)
This patient should be scheduled for an appointment with a provider as soon as possible. All of his prescriptions can be okayed 1

## 2014-12-07 NOTE — Telephone Encounter (Signed)
Last seen 06/15/14  DWM  No upcoming appt scheduled

## 2014-12-24 ENCOUNTER — Other Ambulatory Visit: Payer: Self-pay | Admitting: Nurse Practitioner

## 2014-12-24 MED ORDER — TACROLIMUS 0.1 % EX OINT: TOPICAL_OINTMENT | Freq: Two times a day (BID) | CUTANEOUS | Status: DC

## 2015-01-01 ENCOUNTER — Telehealth: Payer: Self-pay | Admitting: Nurse Practitioner

## 2015-01-02 NOTE — Telephone Encounter (Signed)
Cannot find in database- what is reat of name of TAC

## 2015-01-04 ENCOUNTER — Other Ambulatory Visit: Payer: Self-pay | Admitting: Family Medicine

## 2015-01-04 NOTE — Telephone Encounter (Signed)
Patient needs a refill on triamcinolone cream 0.1%. Insurance will not pay for the protopic. Triamcinolone called into pharmacy.

## 2015-01-05 NOTE — Telephone Encounter (Signed)
Appt has been made for this Fri, daughter has been seeing Dr. Livia Snellen and wanted his appt made with him

## 2015-01-05 NOTE — Telephone Encounter (Signed)
This patient needs an appointment to be seen by a provider for follow-up and we will refill all of his medications

## 2015-01-05 NOTE — Telephone Encounter (Signed)
Last seen 06/15/14 DWM  Last lipid 05/01/14

## 2015-01-08 ENCOUNTER — Encounter: Payer: Self-pay | Admitting: Family Medicine

## 2015-01-08 ENCOUNTER — Ambulatory Visit (INDEPENDENT_AMBULATORY_CARE_PROVIDER_SITE_OTHER): Payer: Commercial Managed Care - HMO | Admitting: Family Medicine

## 2015-01-08 VITALS — BP 134/65 | HR 63 | Temp 96.9°F | Ht 65.0 in | Wt 210.0 lb

## 2015-01-08 DIAGNOSIS — R609 Edema, unspecified: Secondary | ICD-10-CM

## 2015-01-08 DIAGNOSIS — F0280 Dementia in other diseases classified elsewhere without behavioral disturbance: Secondary | ICD-10-CM

## 2015-01-08 DIAGNOSIS — E119 Type 2 diabetes mellitus without complications: Secondary | ICD-10-CM | POA: Diagnosis not present

## 2015-01-08 DIAGNOSIS — E039 Hypothyroidism, unspecified: Secondary | ICD-10-CM | POA: Diagnosis not present

## 2015-01-08 DIAGNOSIS — G309 Alzheimer's disease, unspecified: Secondary | ICD-10-CM | POA: Diagnosis not present

## 2015-01-08 LAB — POCT GLYCOSYLATED HEMOGLOBIN (HGB A1C): Hemoglobin A1C: 7.1

## 2015-01-08 MED ORDER — DONEPEZIL HCL 5 MG PO TABS: 5.0000 mg | ORAL_TABLET | Freq: Every day | ORAL | Status: DC

## 2015-01-08 NOTE — Progress Notes (Signed)
Subjective:  Patient ID: [name], male    DOB: 08-31-1924  Age: 81 y.o. MRN: [id number]  CC: Diabetes and Hypothyroidism   HPI [name] presents for Patient presents for follow-up on  thyroid. She has a history of hypothyroidism for many years. It has been stable recently. Pt. denies any change in  voice, loss of hair, heat or cold intolerance. Energy level has been adequate to good. He denies constipation and diarrhea. No myxedema. Medication is as noted below. Verified that pt is taking it daily on an empty stomach. Well tolerated.  Follow-up of diabetes. Patient does not check blood sugar at home Patient denies symptoms such as polyuria, polydipsia, excessive hunger, nausea No significant hypoglycemic spells noted. Medications as noted below. Taking them regularly without complication/adverse reaction being reported today.   Daughter is in today with patient. She states that he is getting more forgetful. Questions possibility of Alzheimer's or other dementia. Also notes that he has had moderate swelling but no dyspnea.  History [name] has a past medical history of Diabetes mellitus; Hypertension; History of pneumonia; Hemorrhoids; MI (myocardial infarction) (1996); GERD (gastroesophageal reflux disease); Hypothyroidism; Diverticulosis; Aortic insufficiency; Aortic valve sclerosis; Dyslipidemia; Hard of hearing; and Small bowel obstruction.   He has past surgical history that includes Exploratory laparotomy w/ bowel resection; Transurethral resection of prostate; Cholecystectomy; Colonoscopy (2008); Abdominal hernia repair; and Cataract extraction.   His family history is negative for Colon cancer.He reports that he quit smoking about 72 years ago. He has never used smokeless tobacco. He reports that he does not drink alcohol or use illicit drugs.  Outpatient Prescriptions Prior to Visit  Medication Sig Dispense Refill  . albuterol (PROVENTIL) (2.5 MG/3ML) 0.083% nebulizer  solution Take 3 mLs (2.5 mg total) by nebulization every 6 (six) hours as needed for wheezing or shortness of breath. 150 mL 3  . amLODipine-benazepril (LOTREL) 5-20 MG per capsule TAKE (1) CAPSULE DAILY 30 capsule 0  . aspirin 81 MG tablet Take 81 mg by mouth daily.    . blood glucose meter kit and supplies KIT Dispense based on patient and insurance. Test bid. E11.9 1 each 0  . furosemide (LASIX) 40 MG tablet Take 1 tablet (40 mg total) by mouth daily. 30 tablet 0  . glucose blood test strip Check blood sugar bid. DX- E11.9 100 each 2  . JANUVIA 50 MG tablet TAKE 1 TABLET DAILY 30 tablet 0  . Lancets (ACCU-CHEK MULTICLIX) lancets Use as instructed- check Blood sugar bid and prn 100 each 12  . Lancets MISC Test blood sugar bid. DX-E11.9 100 each 0  . levofloxacin (LEVAQUIN) 750 MG tablet Take 1 tablet (750 mg total) by mouth every other day. 4 tablet 0  . levothyroxine (SYNTHROID, LEVOTHROID) 75 MCG tablet TAKE 1 TABLET DAILY 30 tablet 0  . metoprolol succinate (TOPROL-XL) 50 MG 24 hr tablet TAKE 1 TABLET ONCE A DAY WITH A MEAL 30 tablet 0  . Natural Senna Laxative 64.8-324 MG TABS Take 2 tablets by mouth at bedtime.     . Omega-3 Fatty Acids (FISH OIL) 1000 MG CAPS Take 1 capsule by mouth daily.    Marland Kitchen omeprazole (PRILOSEC) 20 MG capsule TAKE (1) CAPSULE DAILY 30 capsule 0  . simvastatin (ZOCOR) 20 MG tablet Take 1 tablet (20 mg total) by mouth daily. 30 tablet 0  . triamcinolone cream (KENALOG) 0.1 % Apply 1 application topically 2 (two) times daily.    . Melatonin 3 MG TABS Take 3  mg by mouth at bedtime as needed (sleep).      No facility-administered medications prior to visit.    ROS Review of Systems  Constitutional: Negative for fever, chills, diaphoresis and unexpected weight change.  HENT: Negative for congestion, hearing loss, rhinorrhea, sore throat and trouble swallowing.   Respiratory: Negative for cough, chest tightness, shortness of breath and wheezing.   Gastrointestinal:  Negative for nausea, vomiting, abdominal pain, diarrhea, constipation and abdominal distention.  Endocrine: Negative for cold intolerance and heat intolerance.  Genitourinary: Negative for dysuria, hematuria and flank pain.  Musculoskeletal: Negative for joint swelling and arthralgias.  Skin: Negative for rash.  Neurological: Negative for dizziness and headaches.  Psychiatric/Behavioral: Negative for dysphoric mood, decreased concentration and agitation. The patient is not nervous/anxious.     Objective:  BP 134/65 mmHg  Pulse 63  Temp(Src) 96.9 F (36.1 C) (Oral)  Ht 5' 5"  (1.651 m)  Wt 210 lb (95.255 kg)  BMI 34.95 kg/m2  BP Readings from Last 3 Encounters:  01/08/15 134/65  06/16/14 149/57  06/15/14 137/70    Wt Readings from Last 3 Encounters:  01/08/15 210 lb (95.255 kg)  06/15/14 208 lb (94.348 kg)  06/15/14 208 lb (94.348 kg)     Physical Exam  Constitutional: He is oriented to person, place, and time. He appears well-developed and well-nourished. No distress.  HENT:  Head: Normocephalic and atraumatic.  Right Ear: External ear normal.  Left Ear: External ear normal.  Nose: Nose normal.  Mouth/Throat: Oropharynx is clear and moist.  Eyes: Conjunctivae and EOM are normal. Pupils are equal, round, and reactive to light.  Neck: Normal range of motion. Neck supple. No thyromegaly present.  Cardiovascular: Normal rate, regular rhythm and normal heart sounds.   No murmur heard. Pulmonary/Chest: Effort normal and breath sounds normal. No respiratory distress. He has no wheezes. He has no rales.  Abdominal: Soft. Bowel sounds are normal. He exhibits no distension. There is no tenderness.  Lymphadenopathy:    He has no cervical adenopathy.  Neurological: He is alert and oriented to person, place, and time. He has normal reflexes.  Skin: Skin is warm and dry.   patient had difficulty with season of the year and date. He could not perform serial sevens or spell backwards.  He could not recall any of 3 objects at 5 minutes after discussion. He was concrete in his assessment of common sayings such as a stitch in time saves 9.  Lab Results  Component Value Date   HGBA1C 7.1 01/08/2015   HGBA1C 6.8% 05/01/2014   HGBA1C 5.9 09/05/2013    Lab Results  Component Value Date   WBC 7.8 06/16/2014   HGB 12.8* 06/16/2014   HCT 38.2* 06/16/2014   PLT 157 06/16/2014   GLUCOSE 132* 01/08/2015   CHOL 105 01/08/2015   TRIG 117 01/08/2015   HDL 31* 01/08/2015   LDLCALC 51 01/08/2015   ALT 19 01/08/2015   AST 16 01/08/2015   NA 142 01/08/2015   K 3.9 01/08/2015   CL 102 01/08/2015   CREATININE 1.22 01/08/2015   BUN 12 01/08/2015   CO2 22 01/08/2015   TSH 4.030 01/08/2015   INR 1.7* 06/25/2007   HGBA1C 7.1 01/08/2015    Dg Chest 2 View  06/15/2014   CLINICAL DATA:  Cough  EXAM: CHEST  2 VIEW  COMPARISON:  08/10/2010  FINDINGS: Cardiac silhouette normal in size and configuration. Aorta is mildly uncoiled. No mediastinal or hilar masses or evidence of adenopathy.  No lung consolidation or edema. No pleural effusion or pneumothorax.  Bony thorax is demineralized but intact.  No significant change from the prior study.  IMPRESSION: No acute cardiopulmonary disease.   Electronically Signed   By: Lajean Manes M.D.   On: 06/15/2014 21:53    Assessment & Plan:   [name] was seen today for diabetes and hypothyroidism.  Diagnoses and all orders for this visit:  Hypothyroidism, unspecified hypothyroidism type -     Thyroid Panel With TSH  Type 2 diabetes mellitus without complications -     POCT glycosylated hemoglobin (Hb A1C) -     CMP14+EGFR -     Lipid panel  Edema -     Brain natriuretic peptide  Alzheimer's dementia  Other orders -     donepezil (ARICEPT) 5 MG tablet; Take 1 tablet (5 mg total) by mouth at bedtime.   I have discontinued Mr. Goodley Melatonin. I am also having him start on donepezil. Additionally, I am having him maintain his  aspirin, Fish Oil, NATURAL SENNA LAXATIVE, accu-chek multiclix, levofloxacin, albuterol, Lancets, blood glucose meter kit and supplies, glucose blood, triamcinolone cream, amLODipine-benazepril, metoprolol succinate, simvastatin, JANUVIA, omeprazole, levothyroxine, and furosemide.  Meds ordered this encounter  Medications  . donepezil (ARICEPT) 5 MG tablet    Sig: Take 1 tablet (5 mg total) by mouth at bedtime.    Dispense:  30 tablet    Refill:  2     Follow-up: Return in about 3 months (around 04/10/2015).  Claretta Fraise, M.D.

## 2015-01-09 LAB — CMP14+EGFR
ALT: 19 IU/L (ref 0–44)
AST: 16 IU/L (ref 0–40)
Albumin/Globulin Ratio: 1.5 (ref 1.1–2.5)
Albumin: 3.7 g/dL (ref 3.5–4.7)
Alkaline Phosphatase: 63 IU/L (ref 39–117)
BUN/Creatinine Ratio: 10 (ref 10–22)
BUN: 12 mg/dL (ref 8–27)
Bilirubin Total: 0.4 mg/dL (ref 0.0–1.2)
CO2: 22 mmol/L (ref 18–29)
Calcium: 8.6 mg/dL (ref 8.6–10.2)
Chloride: 102 mmol/L (ref 97–108)
Creatinine, Ser: 1.22 mg/dL (ref 0.76–1.27)
GFR, EST AFRICAN AMERICAN: 61 mL/min/{1.73_m2} (ref >59)
GFR, EST NON AFRICAN AMERICAN: 53 mL/min/{1.73_m2} — AB (ref >59)
Globulin, Total: 2.5 g/dL (ref 1.5–4.5)
Glucose: 132 mg/dL — ABNORMAL HIGH (ref 65–99)
Potassium: 3.9 mmol/L (ref 3.5–5.2)
Sodium: 142 mmol/L (ref 134–144)
Total Protein: 6.2 g/dL (ref 6.0–8.5)

## 2015-01-09 LAB — LIPID PANEL
CHOL/HDL RATIO: 3.4 ratio (ref 0.0–5.0)
Cholesterol, Total: 105 mg/dL (ref 100–199)
HDL: 31 mg/dL — ABNORMAL LOW (ref >39)
LDL CALC: 51 mg/dL (ref 0–99)
TRIGLYCERIDES: 117 mg/dL (ref 0–149)
VLDL Cholesterol Cal: 23 mg/dL (ref 5–40)

## 2015-01-09 LAB — THYROID PANEL WITH TSH
FREE THYROXINE INDEX: 2 (ref 1.2–4.9)
T3 UPTAKE RATIO: 31 % (ref 24–39)
T4, Total: 6.3 ug/dL (ref 4.5–12.0)
TSH: 4.03 u[IU]/mL (ref 0.450–4.500)

## 2015-01-09 LAB — BRAIN NATRIURETIC PEPTIDE: BNP: 21.9 pg/mL (ref 0.0–100.0)

## 2015-01-15 ENCOUNTER — Encounter: Payer: Self-pay | Admitting: Physician Assistant

## 2015-01-15 ENCOUNTER — Ambulatory Visit (INDEPENDENT_AMBULATORY_CARE_PROVIDER_SITE_OTHER): Payer: Commercial Managed Care - HMO | Admitting: Physician Assistant

## 2015-01-15 VITALS — BP 136/73 | HR 69 | Temp 97.1°F | Ht 65.0 in | Wt 208.0 lb

## 2015-01-15 DIAGNOSIS — L309 Dermatitis, unspecified: Secondary | ICD-10-CM

## 2015-01-15 DIAGNOSIS — R21 Rash and other nonspecific skin eruption: Secondary | ICD-10-CM

## 2015-01-15 MED ORDER — CLOBETASOL PROPIONATE 0.05 % EX OINT: 1.0000 "application " | TOPICAL_OINTMENT | Freq: Two times a day (BID) | CUTANEOUS | Status: DC

## 2015-01-15 NOTE — Progress Notes (Signed)
   Subjective:    Patient ID: VEER ELAMIN, male    DOB: Oct 19, 1924, 81 y.o.   MRN: [id number]  HPI 81 y/o male presents with c/o rash on bilateral LE that itches and burns. He has had this for several months but it appears to be worsening. He has tried [employer] with no improvement     Review of Systems  Constitutional: Negative.   HENT: Negative.   Eyes: Negative.   Respiratory: Negative.   Cardiovascular: Negative.   Gastrointestinal: Negative.   Endocrine: Negative.   Genitourinary: Negative.   Musculoskeletal: Negative.   Skin:       Itching rash on BLE with dark areas on legs, occasional edema        Objective:   Physical Exam  Constitutional: He appears well-developed and well-nourished. No distress.  Skin: Rash noted. He is not diaphoretic.  Lesions of PIH on bilateral lower extremities, possibly due prior episodes of edema and vascular insufficency  Erythematous papules on BLE   Nursing note and vitals reviewed.         Assessment & Plan:  1. Dermatitis  - CMP14+EGFR to r/o renal cause - Anemia Profile B - clobetasol ointment (TEMOVATE) 0.05 %; Apply 1 application topically 2 (two) times daily.  Dispense: 80 g; Refill: 1 under compression hose   2. Rash and nonspecific skin eruption - CMP14+EGFR - Anemia Profile B - clobetasol ointment (TEMOVATE) 0.05 %; Apply 1 application topically 2 (two) times daily.  Dispense: 80 g; Refill: 1   Continue all meds Labs pending Health Maintenance reviewed Diet and exercise encouraged RTO prn   Irva Loser A. Benjamin Stain PA-C

## 2015-01-15 NOTE — Patient Instructions (Signed)
Stasis Dermatitis Stasis dermatitis occurs when veins lose the ability to pump blood back to the heart (poor venous circulation). Dry skin commonly occurs along with poor venous circulation. Stasis dermatitis appears as red, scaly, itchy patches on the legs. A yellowish or light brown discoloration is also present. Due to scratching or other injury, these patches can become an ulcer. This ulcer may remain for long periods of time. The ulcer can also become infected. Swelling of the legs is often present with stasis dermatitis. If the leg is swollen, this increases the risk of infection and further damage to the skin. Sometimes, intense itching, tingling, and burning occurs before signs of stasis dermatitis appear. You may find yourself scratching the insides of your ankles or rubbing your ankles together before the rash appears. After healing, there are often brown spots on the affected skin. Treatment includes resting and elevating the affected leg above the level of the heart, if possible. Cortisone creams and ointments applied to the skin (topically) may be needed, as well as medicine to reduce swelling in the legs (diuretics). Compression stockings or an elastic wrap may also be needed to reduce swelling. If there is an infection, antibiotic medicines may also be used. Your skin may react to topical medicine (sensitization). Your skin may react to ingredients in wool wax alcohols, fragrances, and even topical corticosteroids. Burow's solution wet packs applied for 30 minutes, 3 times daily, will help the weepy rash. Stop using the packs before your skin gets too dry. You can also use a mixture of 3 parts white vinegar to 1 quart water. Grease your legs daily with ointments, such as petroleum jelly, to fight dryness. Avoid scratching or injuring the affected area. Call your caregiver right away if your rash gets worse, if an ulcer forms, or if you have a fever or other severe symptoms. Document Released:  06/22/2004 Document Revised: 08/07/2011 Document Reviewed: 10/04/2009 [facility] Patient Information 2015 [place], [place]. This information is not intended to replace advice given to you by your health care provider. Make sure you discuss any questions you have with your health care provider.

## 2015-01-16 LAB — CMP14+EGFR
ALK PHOS: 71 IU/L (ref 39–117)
ALT: 18 IU/L (ref 0–44)
AST: 20 IU/L (ref 0–40)
Albumin/Globulin Ratio: 1.4 (ref 1.1–2.5)
Albumin: 3.8 g/dL (ref 3.5–4.7)
BUN/Creatinine Ratio: 11 (ref 10–22)
BUN: 14 mg/dL (ref 8–27)
Bilirubin Total: 0.3 mg/dL (ref 0.0–1.2)
CALCIUM: 8.8 mg/dL (ref 8.6–10.2)
CO2: 23 mmol/L (ref 18–29)
CREATININE: 1.27 mg/dL (ref 0.76–1.27)
Chloride: 99 mmol/L (ref 97–108)
GFR calc Af Amer: 58 mL/min/{1.73_m2} — ABNORMAL LOW (ref >59)
GFR, EST NON AFRICAN AMERICAN: 50 mL/min/{1.73_m2} — AB (ref >59)
GLOBULIN, TOTAL: 2.7 g/dL (ref 1.5–4.5)
GLUCOSE: 155 mg/dL — AB (ref 65–99)
Potassium: 4 mmol/L (ref 3.5–5.2)
SODIUM: 140 mmol/L (ref 134–144)
Total Protein: 6.5 g/dL (ref 6.0–8.5)

## 2015-01-16 LAB — ANEMIA PROFILE B
BASOS: 1 %
Basophils Absolute: 0.1 10*3/uL (ref 0.0–0.2)
EOS (ABSOLUTE): 0.2 10*3/uL (ref 0.0–0.4)
EOS: 3 %
FERRITIN: 35 ng/mL (ref 30–400)
Folate: 15.3 ng/mL (ref >3.0)
HEMATOCRIT: 43.3 % (ref 37.5–51.0)
HEMOGLOBIN: 14 g/dL (ref 12.6–17.7)
IMMATURE GRANS (ABS): 0 10*3/uL (ref 0.0–0.1)
IMMATURE GRANULOCYTES: 0 %
IRON SATURATION: 17 % (ref 15–55)
Iron: 51 ug/dL (ref 38–169)
LYMPHS ABS: 2.6 10*3/uL (ref 0.7–3.1)
LYMPHS: 36 %
MCH: 28.6 pg (ref 26.6–33.0)
MCHC: 32.3 g/dL (ref 31.5–35.7)
MCV: 88 fL (ref 79–97)
Monocytes Absolute: 0.7 10*3/uL (ref 0.1–0.9)
Monocytes: 9 %
Neutrophils Absolute: 3.6 10*3/uL (ref 1.4–7.0)
Neutrophils: 51 %
Platelets: 194 10*3/uL (ref 150–379)
RBC: 4.9 x10E6/uL (ref 4.14–5.80)
RDW: 14.3 % (ref 12.3–15.4)
Retic Ct Pct: 1.4 % (ref 0.6–2.6)
TIBC: 303 ug/dL (ref 250–450)
UIBC: 252 ug/dL (ref 111–343)
Vitamin B-12: 339 pg/mL (ref 211–946)
WBC: 7.2 10*3/uL (ref 3.4–10.8)

## 2015-02-06 ENCOUNTER — Other Ambulatory Visit: Payer: Self-pay | Admitting: Family Medicine

## 2015-02-12 ENCOUNTER — Ambulatory Visit (INDEPENDENT_AMBULATORY_CARE_PROVIDER_SITE_OTHER): Payer: Commercial Managed Care - HMO | Admitting: Family Medicine

## 2015-02-12 ENCOUNTER — Encounter: Payer: Self-pay | Admitting: Family Medicine

## 2015-02-12 VITALS — BP 134/72 | HR 68 | Temp 96.8°F | Ht 65.0 in | Wt 209.8 lb

## 2015-02-12 DIAGNOSIS — E1142 Type 2 diabetes mellitus with diabetic polyneuropathy: Secondary | ICD-10-CM

## 2015-02-12 DIAGNOSIS — F0280 Dementia in other diseases classified elsewhere without behavioral disturbance: Secondary | ICD-10-CM

## 2015-02-12 DIAGNOSIS — E039 Hypothyroidism, unspecified: Secondary | ICD-10-CM | POA: Diagnosis not present

## 2015-02-12 DIAGNOSIS — G309 Alzheimer's disease, unspecified: Secondary | ICD-10-CM | POA: Diagnosis not present

## 2015-02-12 MED ORDER — GABAPENTIN 300 MG PO CAPS: 300.0000 mg | ORAL_CAPSULE | Freq: Every day | ORAL | Status: DC

## 2015-02-12 MED ORDER — LEVOTHYROXINE SODIUM 88 MCG PO TABS: 88.0000 ug | ORAL_TABLET | Freq: Every day | ORAL | Status: DC

## 2015-02-12 MED ORDER — DONEPEZIL HCL 10 MG PO TABS: 10.0000 mg | ORAL_TABLET | Freq: Every day | ORAL | Status: DC

## 2015-02-12 NOTE — Progress Notes (Signed)
Subjective:  Patient ID: [name], male    DOB: [birth year]  Age: 81 y.o. MRN: [id number]  CC: Leg Pain   HPI ERLE GUSTER presents for pain in the lower legs bilaterally. He describes this as a burning sensation. It does have some pins and needles. It is from the knee to the toes. There has been no swelling or redness. However he did have some rash in the past that responded well to a cream he actually would like to have that cream but realizes there is no rash this time.   follow-up of hypertension. Patient has no history of headache chest pain or shortness of breath or recent cough. Patient also denies symptoms of TIA such as numbness weakness lateralizing. Patient checks  blood pressure at home and has not had any elevated readings recently. Patient denies side effects from his medication. States taking it regularly.   History Terrick has a past medical history of Diabetes mellitus; Hypertension; History of pneumonia; Hemorrhoids; MI (myocardial infarction) (1996); GERD (gastroesophageal reflux disease); Hypothyroidism; Diverticulosis; Aortic insufficiency; Aortic valve sclerosis; Dyslipidemia; Hard of hearing; and Small bowel obstruction.   He has past surgical history that includes Exploratory laparotomy w/ bowel resection; Transurethral resection of prostate; Cholecystectomy; Colonoscopy (2008); Abdominal hernia repair; and Cataract extraction.   His family history is negative for Colon cancer.He reports that he quit smoking about 72 years ago. He has never used smokeless tobacco. He reports that he does not drink alcohol or use illicit drugs.  Outpatient Prescriptions Prior to Visit  Medication Sig Dispense Refill  . albuterol (PROVENTIL) (2.5 MG/3ML) 0.083% nebulizer solution Take 3 mLs (2.5 mg total) by nebulization every 6 (six) hours as needed for wheezing or shortness of breath. 150 mL 3  . amLODipine-benazepril (LOTREL) 5-20 MG per capsule TAKE (1) CAPSULE DAILY 30 capsule  4  . aspirin 81 MG tablet Take 81 mg by mouth daily.    . blood glucose meter kit and supplies KIT Dispense based on patient and insurance. Test bid. E11.9 1 each 0  . clobetasol ointment (TEMOVATE) 9.70 % Apply 1 application topically 2 (two) times daily. 80 g 1  . furosemide (LASIX) 40 MG tablet TAKE 1 TABLET DAILY 30 tablet 4  . glucose blood test strip Check blood sugar bid. DX- E11.9 100 each 2  . JANUVIA 50 MG tablet TAKE 1 TABLET DAILY 30 tablet 1  . Lancets (ACCU-CHEK MULTICLIX) lancets Use as instructed- check Blood sugar bid and prn 100 each 12  . Lancets MISC Test blood sugar bid. DX-E11.9 100 each 0  . metoprolol succinate (TOPROL-XL) 50 MG 24 hr tablet TAKE 1 TABLET ONCE A DAY WITH A MEAL 30 tablet 4  . Natural Senna Laxative 64.8-324 MG TABS Take 2 tablets by mouth at bedtime.     . Omega-3 Fatty Acids (FISH OIL) 1000 MG CAPS Take 1 capsule by mouth daily.    Marland Kitchen omeprazole (PRILOSEC) 20 MG capsule TAKE (1) CAPSULE DAILY 30 capsule 4  . simvastatin (ZOCOR) 20 MG tablet TAKE 1 TABLET DAILY 30 tablet 4  . triamcinolone cream (KENALOG) 0.1 % Apply 1 application topically 2 (two) times daily.    Marland Kitchen donepezil (ARICEPT) 5 MG tablet Take 1 tablet (5 mg total) by mouth at bedtime. 30 tablet 2  . levothyroxine (SYNTHROID, LEVOTHROID) 75 MCG tablet TAKE 1 TABLET DAILY 30 tablet 10  . levofloxacin (LEVAQUIN) 750 MG tablet Take 1 tablet (750 mg total) by mouth every other day. (Patient  not taking: Reported on 02/12/2015) 4 tablet 0   No facility-administered medications prior to visit.    ROS Review of Systems  Constitutional: Negative for fever, chills and diaphoresis.  HENT: Negative for congestion, rhinorrhea and sore throat.   Respiratory: Negative for cough, shortness of breath and wheezing.   Cardiovascular: Negative for chest pain.  Gastrointestinal: Negative for nausea, vomiting, abdominal pain, diarrhea, constipation and abdominal distention.  Genitourinary: Negative for dysuria  and frequency.  Musculoskeletal: Negative for joint swelling and arthralgias.  Skin: Negative for rash.  Neurological: Positive for weakness (wheelchair-bound). Negative for tremors and headaches.    Objective:  BP 134/72 mmHg  Pulse 68  Temp(Src) 96.8 F (36 C) (Oral)  Ht 5' 5"  (1.651 m)  Wt 209 lb 12.8 oz (95.165 kg)  BMI 34.91 kg/m2  BP Readings from Last 3 Encounters:  02/12/15 134/72  01/15/15 136/73  01/08/15 134/65    Wt Readings from Last 3 Encounters:  02/12/15 209 lb 12.8 oz (95.165 kg)  01/15/15 208 lb (94.348 kg)  01/08/15 210 lb (95.255 kg)     Physical Exam  Constitutional: He is oriented to person, place, and time. He appears well-developed and well-nourished. No distress.  HENT:  Head: Normocephalic and atraumatic.  Right Ear: External ear normal.  Left Ear: External ear normal.  Nose: Nose normal.  Mouth/Throat: Oropharynx is clear and moist.  Eyes: Conjunctivae and EOM are normal. Pupils are equal, round, and reactive to light.  Neck: Normal range of motion. Neck supple. No thyromegaly present.  Cardiovascular: Normal rate, regular rhythm and normal heart sounds.   No murmur heard. Pulmonary/Chest: Effort normal and breath sounds normal. No respiratory distress. He has no wheezes. He has no rales.  Abdominal: Soft. Bowel sounds are normal. He exhibits no distension. There is no tenderness.  Lymphadenopathy:    He has no cervical adenopathy.  Neurological: He is alert and oriented to person, place, and time. He has normal reflexes.  Skin: Skin is warm and dry.  Psychiatric: He has a normal mood and affect. His behavior is normal. Judgment and thought content normal.    Lab Results  Component Value Date   HGBA1C 7.1 01/08/2015   HGBA1C 6.8% 05/01/2014   HGBA1C 5.9 09/05/2013    Lab Results  Component Value Date   WBC 7.2 01/15/2015   HGB 12.8* 06/16/2014   HCT 43.3 01/15/2015   PLT 157 06/16/2014   GLUCOSE 155* 01/15/2015   CHOL 105  01/08/2015   TRIG 117 01/08/2015   HDL 31* 01/08/2015   LDLCALC 51 01/08/2015   ALT 18 01/15/2015   AST 20 01/15/2015   NA 140 01/15/2015   K 4.0 01/15/2015   CL 99 01/15/2015   CREATININE 1.27 01/15/2015   BUN 14 01/15/2015   CO2 23 01/15/2015   TSH 4.030 01/08/2015   INR 1.7* 06/25/2007   HGBA1C 7.1 01/08/2015    Dg Chest 2 View  06/15/2014   CLINICAL DATA:  Cough  EXAM: CHEST  2 VIEW  COMPARISON:  08/10/2010  FINDINGS: Cardiac silhouette normal in size and configuration. Aorta is mildly uncoiled. No mediastinal or hilar masses or evidence of adenopathy.  No lung consolidation or edema. No pleural effusion or pneumothorax.  Bony thorax is demineralized but intact.  No significant change from the prior study.  IMPRESSION: No acute cardiopulmonary disease.   Electronically Signed   By: Lajean Manes M.D.   On: 06/15/2014 21:53    Assessment & Plan:   Jenny Reichmann  was seen today for leg pain.  Diagnoses and all orders for this visit:  Diabetic polyneuropathy associated with type 2 diabetes mellitus  Hypothyroidism, unspecified hypothyroidism type  Alzheimer disease  Other orders -     gabapentin (NEURONTIN) 300 MG capsule; Take 1 capsule (300 mg total) by mouth at bedtime. -     donepezil (ARICEPT) 10 MG tablet; Take 1 tablet (10 mg total) by mouth at bedtime. -     levothyroxine (SYNTHROID, LEVOTHROID) 88 MCG tablet; Take 1 tablet (88 mcg total) by mouth daily.  I have discontinued Mr. Journey levofloxacin. I have also changed his donepezil and levothyroxine. Additionally, I am having him start on gabapentin. Lastly, I am having him maintain his aspirin, Fish Oil, NATURAL SENNA LAXATIVE, accu-chek multiclix, albuterol, Lancets, blood glucose meter kit and supplies, glucose blood, triamcinolone cream, clobetasol ointment, furosemide, metoprolol succinate, simvastatin, JANUVIA, amLODipine-benazepril, and omeprazole.  Meds ordered this encounter  Medications  . gabapentin  (NEURONTIN) 300 MG capsule    Sig: Take 1 capsule (300 mg total) by mouth at bedtime.    Dispense:  30 capsule    Refill:  1  . donepezil (ARICEPT) 10 MG tablet    Sig: Take 1 tablet (10 mg total) by mouth at bedtime.    Dispense:  30 tablet    Refill:  5  . levothyroxine (SYNTHROID, LEVOTHROID) 88 MCG tablet    Sig: Take 1 tablet (88 mcg total) by mouth daily.    Dispense:  30 tablet    Refill:  5     Follow-up: Return in about 1 month (around 03/14/2015).  Claretta Fraise, M.D.

## 2015-02-23 ENCOUNTER — Ambulatory Visit (INDEPENDENT_AMBULATORY_CARE_PROVIDER_SITE_OTHER): Payer: Commercial Managed Care - HMO

## 2015-02-23 ENCOUNTER — Encounter: Payer: Self-pay | Admitting: Family Medicine

## 2015-02-23 ENCOUNTER — Ambulatory Visit (INDEPENDENT_AMBULATORY_CARE_PROVIDER_SITE_OTHER): Payer: Commercial Managed Care - HMO | Admitting: Family Medicine

## 2015-02-23 VITALS — BP 119/66 | HR 60 | Temp 96.9°F | Ht 65.0 in | Wt 210.2 lb

## 2015-02-23 DIAGNOSIS — M545 Low back pain: Secondary | ICD-10-CM

## 2015-02-23 DIAGNOSIS — IMO0001 Reserved for inherently not codable concepts without codable children: Secondary | ICD-10-CM

## 2015-02-23 DIAGNOSIS — M4850XA Collapsed vertebra, not elsewhere classified, site unspecified, initial encounter for fracture: Secondary | ICD-10-CM

## 2015-02-23 DIAGNOSIS — M79605 Pain in left leg: Secondary | ICD-10-CM

## 2015-02-23 MED ORDER — TRAMADOL HCL 50 MG PO TABS: 50.0000 mg | ORAL_TABLET | Freq: Four times a day (QID) | ORAL | Status: DC | PRN

## 2015-02-23 NOTE — Progress Notes (Addendum)
Subjective:  Patient ID: [name], male    DOB: 08/28/1924  Age: 81 y.o. MRN: [id number]  CC: Back Pain   HPI [name] presents for onset of pain just after last OV on 9/16. [name] No radiation, but knees hurt. Knees burn. Aches in lumbar midline and across to hips. Not drinking much. Worse at night. Okay when sitting in a chair.  History Boyd has a past medical history of Diabetes mellitus; Hypertension; History of pneumonia; Hemorrhoids; MI (myocardial infarction) (1996); GERD (gastroesophageal reflux disease); Hypothyroidism; Diverticulosis; Aortic insufficiency; Aortic valve sclerosis; Dyslipidemia; Hard of hearing; and Small bowel obstruction.   He has past surgical history that includes Exploratory laparotomy w/ bowel resection; Transurethral resection of prostate; Cholecystectomy; Colonoscopy (2008); Abdominal hernia repair; and Cataract extraction.   His family history is negative for Colon cancer.He reports that he quit smoking about 72 years ago. He has never used smokeless tobacco. He reports that he does not drink alcohol or use illicit drugs.  Outpatient Prescriptions Prior to Visit  Medication Sig Dispense Refill  . albuterol (PROVENTIL) (2.5 MG/3ML) 0.083% nebulizer solution Take 3 mLs (2.5 mg total) by nebulization every 6 (six) hours as needed for wheezing or shortness of breath. 150 mL 3  . amLODipine-benazepril (LOTREL) 5-20 MG per capsule TAKE (1) CAPSULE DAILY 30 capsule 4  . aspirin 81 MG tablet Take 81 mg by mouth daily.    . blood glucose meter kit and supplies KIT Dispense based on patient and insurance. Test bid. E11.9 1 each 0  . clobetasol ointment (TEMOVATE) 0.93 % Apply 1 application topically 2 (two) times daily. 80 g 1  . donepezil (ARICEPT) 10 MG tablet Take 1 tablet (10 mg total) by mouth at bedtime. 30 tablet 5  . furosemide (LASIX) 40 MG tablet TAKE 1 TABLET DAILY 30 tablet 4  . gabapentin (NEURONTIN) 300 MG capsule Take 1 capsule (300 mg total)  by mouth at bedtime. 30 capsule 1  . glucose blood test strip Check blood sugar bid. DX- E11.9 100 each 2  . JANUVIA 50 MG tablet TAKE 1 TABLET DAILY 30 tablet 1  . levothyroxine (SYNTHROID, LEVOTHROID) 88 MCG tablet Take 1 tablet (88 mcg total) by mouth daily. 30 tablet 5  . metoprolol succinate (TOPROL-XL) 50 MG 24 hr tablet TAKE 1 TABLET ONCE A DAY WITH A MEAL 30 tablet 4  . Natural Senna Laxative 64.8-324 MG TABS Take 2 tablets by mouth at bedtime.     . Omega-3 Fatty Acids (FISH OIL) 1000 MG CAPS Take 1 capsule by mouth daily.    [name] omeprazole (PRILOSEC) 20 MG capsule TAKE (1) CAPSULE DAILY 30 capsule 4  . simvastatin (ZOCOR) 20 MG tablet TAKE 1 TABLET DAILY 30 tablet 4  . triamcinolone cream (KENALOG) 0.1 % Apply 1 application topically 2 (two) times daily.    . Lancets (ACCU-CHEK MULTICLIX) lancets Use as instructed- check Blood sugar bid and prn 100 each 12  . Lancets MISC Test blood sugar bid. DX-E11.9 100 each 0   No facility-administered medications prior to visit.    ROS Review of Systems  Constitutional: Negative for fever, chills and diaphoresis.  HENT: Negative for congestion, rhinorrhea and sore throat.   Respiratory: Negative for cough, shortness of breath and wheezing.   Cardiovascular: Negative for chest pain.  Gastrointestinal: Negative for nausea, vomiting, abdominal pain, diarrhea, constipation and abdominal distention.  Genitourinary: Negative for dysuria and frequency.  Musculoskeletal: Positive for myalgias, back pain, joint swelling and arthralgias.  Skin: Negative for rash.  Neurological: Negative for headaches.    Objective:  BP 119/66 mmHg  Pulse 60  Temp(Src) 96.9 F (36.1 C) (Oral)  Ht $R'5\' 5"'BA$  (1.651 m)  Wt 210 lb 3.2 oz (95.346 kg)  BMI 34.98 kg/m2  BP Readings from Last 3 Encounters:  02/23/15 119/66  02/12/15 134/72  01/15/15 136/73    Wt Readings from Last 3 Encounters:  02/23/15 210 lb 3.2 oz (95.346 kg)  02/12/15 209 lb 12.8 oz (95.165  kg)  01/15/15 208 lb (94.348 kg)     Physical Exam  Constitutional: He is oriented to person, place, and time. He appears well-developed and well-nourished. No distress.  HENT:  Head: Normocephalic and atraumatic.  Right Ear: External ear normal.  Left Ear: External ear normal.  Nose: Nose normal.  Mouth/Throat: Oropharynx is clear and moist.  Eyes: Conjunctivae and EOM are normal. Pupils are equal, round, and reactive to light.  Neck: Normal range of motion. Neck supple. No thyromegaly present.  Cardiovascular: Normal rate, regular rhythm and normal heart sounds.   No murmur heard. Pulmonary/Chest: Effort normal and breath sounds normal. No respiratory distress. He has no wheezes. He has no rales.  Abdominal: Soft. Bowel sounds are normal. He exhibits no distension. There is no tenderness.  Lymphadenopathy:    He has no cervical adenopathy.  Neurological: He is alert and oriented to person, place, and time. He has normal reflexes.  Skin: Skin is warm and dry.  Psychiatric: He has a normal mood and affect. His behavior is normal. Judgment and thought content normal.    Lab Results  Component Value Date   HGBA1C 7.1 01/08/2015   HGBA1C 6.8% 05/01/2014   HGBA1C 5.9 09/05/2013    Lab Results  Component Value Date   WBC 7.2 01/15/2015   HGB 12.8* 06/16/2014   HCT 43.3 01/15/2015   PLT 157 06/16/2014   GLUCOSE 155* 01/15/2015   CHOL 105 01/08/2015   TRIG 117 01/08/2015   HDL 31* 01/08/2015   LDLCALC 51 01/08/2015   ALT 18 01/15/2015   AST 20 01/15/2015   NA 140 01/15/2015   K 4.0 01/15/2015   CL 99 01/15/2015   CREATININE 1.27 01/15/2015   BUN 14 01/15/2015   CO2 23 01/15/2015   TSH 4.030 01/08/2015   INR 1.7* 06/25/2007   HGBA1C 7.1 01/08/2015    Dg Chest 2 View  06/15/2014   CLINICAL DATA:  Cough  EXAM: CHEST  2 VIEW  COMPARISON:  08/10/2010  FINDINGS: Cardiac silhouette normal in size and configuration. Aorta is mildly uncoiled. No mediastinal or hilar masses  or evidence of adenopathy.  No lung consolidation or edema. No pleural effusion or pneumothorax.  Bony thorax is demineralized but intact.  No significant change from the prior study.  IMPRESSION: No acute cardiopulmonary disease.   Electronically Signed   By: Lajean Manes M.D.   On: 06/15/2014 21:53    Assessment & Plan:   Britt was seen today for back pain.  Diagnoses and all orders for this visit:  LBP radiating to both legs -     DG Lumbar Spine 2-3 Views -     MR Lumbar Spine Wo Contrast; Future  Spinal compression fracture, initial encounter -     MR Lumbar Spine Wo Contrast; Future  Other orders -     traMADol (ULTRAM) 50 MG tablet; Take 1 tablet (50 mg total) by mouth 4 (four) times daily as needed for moderate pain.   I  have discontinued Mr. Perrot accu-chek multiclix and Lancets. I am also having him start on traMADol. Additionally, I am having him maintain his aspirin, Fish Oil, NATURAL SENNA LAXATIVE, albuterol, blood glucose meter kit and supplies, glucose blood, triamcinolone cream, clobetasol ointment, furosemide, metoprolol succinate, simvastatin, JANUVIA, amLODipine-benazepril, omeprazole, gabapentin, donepezil, and levothyroxine.  Meds ordered this encounter  Medications  . traMADol (ULTRAM) 50 MG tablet    Sig: Take 1 tablet (50 mg total) by mouth 4 (four) times daily as needed for moderate pain.    Dispense:  60 tablet    Refill:  02   Severe degeneration of the lumbar spine noted on diagnostic x-ray. There is suggestion of compression as well. Therefore MRI to rule out compression fracture is ordered.  Follow-up: Return in about 2 weeks (around 03/09/2015).  Claretta Fraise, M.D.

## 2015-03-15 ENCOUNTER — Ambulatory Visit (INDEPENDENT_AMBULATORY_CARE_PROVIDER_SITE_OTHER): Payer: Commercial Managed Care - HMO | Admitting: Family Medicine

## 2015-03-15 VITALS — BP 117/63 | HR 94 | Temp 98.0°F | Ht 65.0 in | Wt 209.0 lb

## 2015-03-15 DIAGNOSIS — M549 Dorsalgia, unspecified: Secondary | ICD-10-CM | POA: Diagnosis not present

## 2015-03-15 DIAGNOSIS — G8929 Other chronic pain: Secondary | ICD-10-CM

## 2015-03-15 DIAGNOSIS — R2689 Other abnormalities of gait and mobility: Secondary | ICD-10-CM | POA: Diagnosis not present

## 2015-03-15 DIAGNOSIS — Z23 Encounter for immunization: Secondary | ICD-10-CM | POA: Diagnosis not present

## 2015-03-15 DIAGNOSIS — G309 Alzheimer's disease, unspecified: Secondary | ICD-10-CM

## 2015-03-15 DIAGNOSIS — M1990 Unspecified osteoarthritis, unspecified site: Secondary | ICD-10-CM

## 2015-03-15 DIAGNOSIS — I10 Essential (primary) hypertension: Secondary | ICD-10-CM

## 2015-03-15 DIAGNOSIS — F0280 Dementia in other diseases classified elsewhere without behavioral disturbance: Secondary | ICD-10-CM

## 2015-03-15 MED ORDER — DONEPEZIL HCL 10 MG PO TABS: 20.0000 mg | ORAL_TABLET | Freq: Every day | ORAL | Status: DC

## 2015-03-15 MED ORDER — BLOOD GLUCOSE MONITOR KIT: PACK | Status: DC

## 2015-03-15 NOTE — Progress Notes (Signed)
Subjective:  Patient ID: [name], male    DOB: 01-Jun-1924  Age: 81 y.o. MRN: [id number]  CC: Leg Pain   HPI [name] presents for left heel hurts. The heel has a deep aching sensation that is unrelated to activities. However it is painful for weightbearing and ambulation. Having verbrebroplasty in 4 days. Needs a prescription for a wheelchair. He is having difficulty getting around and is off balance and there is tremendous concern for a fall. Specifically he has gotten more unsteady since the vertebral fracture occurred. However his history of arthritis and chronic back pain are significant contributors.  follow-up of hypertension. Patient has no history of headache chest pain or shortness of breath or recent cough. Patient also denies symptoms of TIA such as numbness weakness lateralizing. Patient not checking blood pressure at home . Patient denies side effects from his medication. States taking it regularly.  Patient has been tolerating the donepezil quite well. He has had no excessive or significant nausea. Unfortunately it just doesn't seem to have improved his memory or cognition to a noticeable degree to the family.      History [name] has a past medical history of Diabetes mellitus; Hypertension; History of pneumonia; Hemorrhoids; MI (myocardial infarction) (1996); GERD (gastroesophageal reflux disease); Hypothyroidism; Diverticulosis; Aortic insufficiency; Aortic valve sclerosis; Dyslipidemia; Hard of hearing; and Small bowel obstruction.   He has past surgical history that includes Exploratory laparotomy w/ bowel resection; Transurethral resection of prostate; Cholecystectomy; Colonoscopy (2008); Abdominal hernia repair; and Cataract extraction.   His family history is negative for Colon cancer.He reports that he quit smoking about 72 years ago. He has never used smokeless tobacco. He reports that he does not drink alcohol or use illicit drugs.  Outpatient  Prescriptions Prior to Visit  Medication Sig Dispense Refill  . albuterol (PROVENTIL) (2.5 MG/3ML) 0.083% nebulizer solution Take 3 mLs (2.5 mg total) by nebulization every 6 (six) hours as needed for wheezing or shortness of breath. 150 mL 3  . amLODipine-benazepril (LOTREL) 5-20 MG per capsule TAKE (1) CAPSULE DAILY 30 capsule 4  . aspirin 81 MG tablet Take 81 mg by mouth daily.    . blood glucose meter kit and supplies KIT Dispense based on patient and insurance. Test bid. E11.9 1 each 0  . clobetasol ointment (TEMOVATE) 3.14 % Apply 1 application topically 2 (two) times daily. 80 g 1  . furosemide (LASIX) 40 MG tablet TAKE 1 TABLET DAILY 30 tablet 4  . gabapentin (NEURONTIN) 300 MG capsule Take 1 capsule (300 mg total) by mouth at bedtime. 30 capsule 1  . glucose blood test strip Check blood sugar bid. DX- E11.9 100 each 2  . JANUVIA 50 MG tablet TAKE 1 TABLET DAILY 30 tablet 1  . levothyroxine (SYNTHROID, LEVOTHROID) 88 MCG tablet Take 1 tablet (88 mcg total) by mouth daily. 30 tablet 5  . metoprolol succinate (TOPROL-XL) 50 MG 24 hr tablet TAKE 1 TABLET ONCE A DAY WITH A MEAL 30 tablet 4  . Natural Senna Laxative 64.8-324 MG TABS Take 2 tablets by mouth at bedtime.     . Omega-3 Fatty Acids (FISH OIL) 1000 MG CAPS Take 1 capsule by mouth daily.    Marland Kitchen omeprazole (PRILOSEC) 20 MG capsule TAKE (1) CAPSULE DAILY 30 capsule 4  . simvastatin (ZOCOR) 20 MG tablet TAKE 1 TABLET DAILY 30 tablet 4  . traMADol (ULTRAM) 50 MG tablet Take 1 tablet (50 mg total) by mouth 4 (four) times daily as needed  for moderate pain. 60 tablet 02  . triamcinolone cream (KENALOG) 0.1 % Apply 1 application topically 2 (two) times daily.    Marland Kitchen donepezil (ARICEPT) 10 MG tablet Take 1 tablet (10 mg total) by mouth at bedtime. 30 tablet 5   No facility-administered medications prior to visit.    ROS Review of Systems  Constitutional: Negative for fever, chills and diaphoresis.  HENT: Negative for congestion, rhinorrhea  and sore throat.   Respiratory: Negative for cough, shortness of breath and wheezing.   Cardiovascular: Negative for chest pain.  Gastrointestinal: Negative for nausea, vomiting, abdominal pain, diarrhea, constipation and abdominal distention.  Genitourinary: Negative for dysuria and frequency.  Musculoskeletal: Positive for back pain, joint swelling and arthralgias.  Skin: Negative for rash.  Neurological: Negative for headaches.  Psychiatric/Behavioral: Positive for behavioral problems, confusion and agitation. The patient is nervous/anxious.     Objective:  BP 117/63 mmHg  Pulse 94  Temp(Src) 98 F (36.7 C) (Oral)  Ht 5' 5"  (1.651 m)  Wt 209 lb (94.802 kg)  BMI 34.78 kg/m2  BP Readings from Last 3 Encounters:  03/15/15 117/63  02/23/15 119/66  02/12/15 134/72    Wt Readings from Last 3 Encounters:  03/15/15 209 lb (94.802 kg)  02/23/15 210 lb 3.2 oz (95.346 kg)  02/12/15 209 lb 12.8 oz (95.165 kg)     Physical Exam  Constitutional: He appears well-developed and well-nourished. No distress.  HENT:  Head: Normocephalic and atraumatic.  Eyes: Conjunctivae and EOM are normal. Pupils are equal, round, and reactive to light.  Neck: Normal range of motion. Neck supple. No thyromegaly present.  Cardiovascular: Normal rate, regular rhythm and normal heart sounds.   No murmur heard. Pulmonary/Chest: Effort normal and breath sounds normal. No respiratory distress. He has no wheezes. He has no rales.  Abdominal: Soft. Bowel sounds are normal. He exhibits no distension. There is no tenderness.  Lymphadenopathy:    He has no cervical adenopathy.  Neurological: He is alert. He has normal reflexes. He exhibits abnormal muscle tone. Coordination abnormal.  Skin: Skin is warm and dry.    Lab Results  Component Value Date   HGBA1C 7.1 01/08/2015   HGBA1C 6.8% 05/01/2014   HGBA1C 5.9 09/05/2013    Lab Results  Component Value Date   WBC 7.2 01/15/2015   HGB 12.8* 06/16/2014     HCT 43.3 01/15/2015   PLT 157 06/16/2014   GLUCOSE 155* 01/15/2015   CHOL 105 01/08/2015   TRIG 117 01/08/2015   HDL 31* 01/08/2015   LDLCALC 51 01/08/2015   ALT 18 01/15/2015   AST 20 01/15/2015   NA 140 01/15/2015   K 4.0 01/15/2015   CL 99 01/15/2015   CREATININE 1.27 01/15/2015   BUN 14 01/15/2015   CO2 23 01/15/2015   TSH 4.030 01/08/2015   INR 1.7* 06/25/2007   HGBA1C 7.1 01/08/2015    Dg Chest 2 View  06/15/2014  CLINICAL DATA:  Cough EXAM: CHEST  2 VIEW COMPARISON:  08/10/2010 FINDINGS: Cardiac silhouette normal in size and configuration. Aorta is mildly uncoiled. No mediastinal or hilar masses or evidence of adenopathy. No lung consolidation or edema. No pleural effusion or pneumothorax. Bony thorax is demineralized but intact. No significant change from the prior study. IMPRESSION: No acute cardiopulmonary disease. Electronically Signed   By: Lajean Manes M.D.   On: 06/15/2014 21:53    Assessment & Plan:   Rubel was seen today for leg pain.  Diagnoses and all orders for this  visit:  Impaired gait and mobility  Alzheimer disease  Essential hypertension  Chronic back pain  Arthritis  Other orders -     Pneumococcal conjugate vaccine 13-valent -     Flu Vaccine QUAD 36+ mos IM -     DME Other see comment -     blood glucose meter kit and supplies KIT; Dispense based on patient and insurance preference. Use up to four times daily as directed. (FOR ICD-9 250.00, 250.01). -     donepezil (ARICEPT) 10 MG tablet; Take 2 tablets (20 mg total) by mouth at bedtime.   I have changed Mr. Fredin donepezil. I am also having him start on blood glucose meter kit and supplies. Additionally, I am having him maintain his aspirin, Fish Oil, NATURAL SENNA LAXATIVE, albuterol, blood glucose meter kit and supplies, glucose blood, triamcinolone cream, clobetasol ointment, furosemide, metoprolol succinate, simvastatin, JANUVIA, amLODipine-benazepril, omeprazole, gabapentin,  levothyroxine, and traMADol.  Meds ordered this encounter  Medications  . blood glucose meter kit and supplies KIT    Sig: Dispense based on patient and insurance preference. Use up to four times daily as directed. (FOR ICD-9 250.00, 250.01).    Dispense:  1 each    Refill:  0    Order Specific Question:  Number of strips    Answer:  100    Order Specific Question:  Number of lancets    Answer:  100  . donepezil (ARICEPT) 10 MG tablet    Sig: Take 2 tablets (20 mg total) by mouth at bedtime.    Dispense:  60 tablet    Refill:  5     Follow-up: Return in about 3 months (around 06/15/2015) for memory, diabetes, hypertension.  Claretta Fraise, M.D.

## 2015-03-16 ENCOUNTER — Encounter: Payer: Self-pay | Admitting: Family Medicine

## 2015-03-19 ENCOUNTER — Ambulatory Visit (HOSPITAL_COMMUNITY)
Admission: RE | Admit: 2015-03-19 | Discharge: 2015-03-19 | Disposition: A | Discharge status: Home / Self Care | Payer: Commercial Managed Care - HMO | Source: Ambulatory Visit | Attending: Family Medicine | Admitting: Family Medicine

## 2015-03-19 DIAGNOSIS — M545 Low back pain: Secondary | ICD-10-CM

## 2015-03-19 DIAGNOSIS — M5136 Other intervertebral disc degeneration, lumbar region: Secondary | ICD-10-CM | POA: Diagnosis not present

## 2015-03-19 DIAGNOSIS — IMO0001 Reserved for inherently not codable concepts without codable children: Secondary | ICD-10-CM

## 2015-03-19 DIAGNOSIS — R531 Weakness: Secondary | ICD-10-CM | POA: Insufficient documentation

## 2015-03-19 DIAGNOSIS — M79604 Pain in right leg: Secondary | ICD-10-CM

## 2015-03-19 DIAGNOSIS — M4806 Spinal stenosis, lumbar region: Secondary | ICD-10-CM | POA: Insufficient documentation

## 2015-03-19 DIAGNOSIS — M5126 Other intervertebral disc displacement, lumbar region: Secondary | ICD-10-CM | POA: Diagnosis not present

## 2015-03-19 DIAGNOSIS — M5127 Other intervertebral disc displacement, lumbosacral region: Secondary | ICD-10-CM | POA: Insufficient documentation

## 2015-03-19 DIAGNOSIS — M4850XA Collapsed vertebra, not elsewhere classified, site unspecified, initial encounter for fracture: Secondary | ICD-10-CM

## 2015-03-23 ENCOUNTER — Telehealth: Payer: Self-pay

## 2015-03-23 ENCOUNTER — Other Ambulatory Visit: Payer: Self-pay

## 2015-03-23 DIAGNOSIS — I2510 Atherosclerotic heart disease of native coronary artery without angina pectoris: Secondary | ICD-10-CM

## 2015-03-23 DIAGNOSIS — M545 Low back pain: Secondary | ICD-10-CM

## 2015-03-23 NOTE — Telephone Encounter (Signed)
PLease refer

## 2015-03-23 NOTE — Telephone Encounter (Signed)
Patient's daughter wants a referral for him to Podiatrist for trimming of toenails, patient is diabetic

## 2015-03-23 NOTE — Telephone Encounter (Signed)
Please refer

## 2015-03-23 NOTE — Telephone Encounter (Signed)
Referral for podiatry sent to referrals

## 2015-03-30 ENCOUNTER — Telehealth: Payer: Self-pay | Admitting: Family Medicine

## 2015-04-03 ENCOUNTER — Other Ambulatory Visit: Payer: Self-pay | Admitting: Physician Assistant

## 2015-04-03 ENCOUNTER — Other Ambulatory Visit: Payer: Self-pay | Admitting: Family Medicine

## 2015-04-08 ENCOUNTER — Telehealth: Payer: Self-pay | Admitting: Family Medicine

## 2015-04-08 DIAGNOSIS — M549 Dorsalgia, unspecified: Principal | ICD-10-CM

## 2015-04-08 DIAGNOSIS — G8929 Other chronic pain: Secondary | ICD-10-CM

## 2015-04-08 NOTE — Telephone Encounter (Signed)
Dr. Emmaline Kluver at [facility] in [name]

## 2015-04-12 ENCOUNTER — Ambulatory Visit: Payer: Commercial Managed Care - HMO | Admitting: Family Medicine

## 2015-04-12 NOTE — Telephone Encounter (Signed)
Referral sent to referral department for pain clinic

## 2015-04-15 DIAGNOSIS — E1151 Type 2 diabetes mellitus with diabetic peripheral angiopathy without gangrene: Secondary | ICD-10-CM | POA: Diagnosis not present

## 2015-04-15 DIAGNOSIS — B351 Tinea unguium: Secondary | ICD-10-CM | POA: Diagnosis not present

## 2015-05-04 DIAGNOSIS — M488X6 Other specified spondylopathies, lumbar region: Secondary | ICD-10-CM | POA: Diagnosis not present

## 2015-05-04 DIAGNOSIS — Z79891 Long term (current) use of opiate analgesic: Secondary | ICD-10-CM | POA: Diagnosis not present

## 2015-05-04 DIAGNOSIS — G8929 Other chronic pain: Secondary | ICD-10-CM | POA: Diagnosis not present

## 2015-05-04 DIAGNOSIS — M545 Low back pain: Secondary | ICD-10-CM | POA: Diagnosis not present

## 2015-05-04 DIAGNOSIS — M47816 Spondylosis without myelopathy or radiculopathy, lumbar region: Secondary | ICD-10-CM | POA: Diagnosis not present

## 2015-05-07 ENCOUNTER — Other Ambulatory Visit: Payer: Self-pay | Admitting: Family Medicine

## 2015-06-07 ENCOUNTER — Other Ambulatory Visit: Payer: Self-pay | Admitting: Family Medicine

## 2015-06-24 ENCOUNTER — Telehealth: Payer: Self-pay | Admitting: Family Medicine

## 2015-06-25 ENCOUNTER — Telehealth: Payer: Self-pay | Admitting: Family Medicine

## 2015-06-25 MED ORDER — HYDROXYZINE PAMOATE 25 MG PO CAPS: 25.0000 mg | ORAL_CAPSULE | Freq: Four times a day (QID) | ORAL | Status: DC | PRN

## 2015-06-25 NOTE — Telephone Encounter (Signed)
Vistaril sent.

## 2015-06-26 NOTE — Telephone Encounter (Signed)
Patient aware.

## 2015-07-05 ENCOUNTER — Ambulatory Visit (INDEPENDENT_AMBULATORY_CARE_PROVIDER_SITE_OTHER): Payer: Commercial Managed Care - HMO | Admitting: Family Medicine

## 2015-07-05 ENCOUNTER — Encounter: Payer: Self-pay | Admitting: Family Medicine

## 2015-07-05 ENCOUNTER — Other Ambulatory Visit: Payer: Self-pay | Admitting: Family Medicine

## 2015-07-05 ENCOUNTER — Other Ambulatory Visit: Payer: Self-pay | Admitting: Physician Assistant

## 2015-07-05 VITALS — BP 129/67 | HR 62 | Temp 96.5°F | Ht 65.0 in | Wt 204.8 lb

## 2015-07-05 DIAGNOSIS — M549 Dorsalgia, unspecified: Secondary | ICD-10-CM

## 2015-07-05 DIAGNOSIS — E785 Hyperlipidemia, unspecified: Secondary | ICD-10-CM | POA: Diagnosis not present

## 2015-07-05 DIAGNOSIS — G8929 Other chronic pain: Secondary | ICD-10-CM | POA: Diagnosis not present

## 2015-07-05 DIAGNOSIS — I10 Essential (primary) hypertension: Secondary | ICD-10-CM | POA: Diagnosis not present

## 2015-07-05 DIAGNOSIS — E039 Hypothyroidism, unspecified: Secondary | ICD-10-CM

## 2015-07-05 DIAGNOSIS — E1142 Type 2 diabetes mellitus with diabetic polyneuropathy: Secondary | ICD-10-CM | POA: Diagnosis not present

## 2015-07-05 LAB — POCT GLYCOSYLATED HEMOGLOBIN (HGB A1C): HEMOGLOBIN A1C: 6.8

## 2015-07-05 NOTE — Progress Notes (Signed)
Subjective:  Patient ID: [name], male    DOB: Jun 21, 1924  Age: 82 y.o. MRN: [id number]  CC: Diabetes; Hypothyroidism; Dementia; and Hypertension   HPI RASHID WHITENIGHT presents for  follow-up of hypertension. Patient has no history of headache chest pain or shortness of breath or recent cough. Patient also denies symptoms of TIA such as numbness weakness lateralizing. Patient checks  blood pressure at home and has not had any elevated readings recently. Patient denies side effects from his medication. States taking it regularly.  Patient also  in for follow-up of elevated cholesterol. Doing well without complaints on current medication. Denies side effects of statin including myalgia and arthralgia and nausea. Also in today for liver function testing. Currently no chest pain, shortness of breath or other cardiovascular related symptoms noted.  Follow-up of diabetes. Caretaker occasionally checks his blood sugar at home. Readings not available Caretaker states pt. Not having symptoms such as polyuria, polydipsia, excessive hunger, nausea No significant hypoglycemic spells noted. He refuses to allow podiatrist to trim nails.  Medications as noted below. Taking them regularly without complication/adverse reaction being reported today.   Patient presents for follow-up on  thyroid. She has a history of hypothyroidism for many years. It has been stable recently. Caretaker. denies any change in  voice, loss of hair, heat or cold intolerance. Energy level has been adequate to good. She denies constipation and diarrhea. No myxedema. Medication is as noted below. Verified that pt is taking it daily on an empty stomach. Well tolerated.   History Joaquim has a past medical history of Diabetes mellitus; Hypertension; History of pneumonia; Hemorrhoids; MI (myocardial infarction) ([place]) (1996); GERD (gastroesophageal reflux disease); Hypothyroidism; Diverticulosis; Aortic insufficiency; Aortic valve  sclerosis; Dyslipidemia; Hard of hearing; and Small bowel obstruction ([place]).   He has past surgical history that includes Exploratory laparotomy w/ bowel resection; Transurethral resection of prostate; Cholecystectomy; Colonoscopy (2008); Abdominal hernia repair; and Cataract extraction.   His family history is negative for Colon cancer.He reports that he quit smoking about 72 years ago. He has never used smokeless tobacco. He reports that he does not drink alcohol or use illicit drugs.  Current Outpatient Prescriptions on File Prior to Visit  Medication Sig Dispense Refill  . albuterol (PROVENTIL) (2.5 MG/3ML) 0.083% nebulizer solution Take 3 mLs (2.5 mg total) by nebulization every 6 (six) hours as needed for wheezing or shortness of breath. 150 mL 3  . aspirin 81 MG tablet Take 81 mg by mouth daily.    . blood glucose meter kit and supplies KIT Dispense based on patient and insurance. Test bid. E11.9 1 each 0  . blood glucose meter kit and supplies KIT Dispense based on patient and insurance preference. Use up to four times daily as directed. (FOR ICD-9 250.00, 250.01). 1 each 0  . clobetasol ointment (TEMOVATE) 7.10 % Apply 1 application topically 2 (two) times daily. 80 g 1  . donepezil (ARICEPT) 10 MG tablet Take 2 tablets (20 mg total) by mouth at bedtime. (Patient taking differently: Take 10 mg by mouth at bedtime. ) 60 tablet 5  . glucose blood test strip Check blood sugar bid. DX- E11.9 100 each 2  . hydrOXYzine (VISTARIL) 25 MG capsule Take 1 capsule (25 mg total) by mouth every 6 (six) hours as needed for anxiety. 30 capsule 2  . levothyroxine (SYNTHROID, LEVOTHROID) 88 MCG tablet Take 1 tablet (88 mcg total) by mouth daily. 30 tablet 5  . Natural Senna Laxative 64.8-324 MG TABS  Take 2 tablets by mouth at bedtime.     . Omega-3 Fatty Acids (FISH OIL) 1000 MG CAPS Take 1 capsule by mouth daily.    . traMADol (ULTRAM) 50 MG tablet Take 1 tablet (50 mg total) by mouth 4 (four) times daily  as needed for moderate pain. 60 tablet 02  . triamcinolone cream (KENALOG) 0.1 % Apply 1 application topically 2 (two) times daily.     No current facility-administered medications on file prior to visit.    ROS Review of Systems  Constitutional: Negative for fever, chills, diaphoresis and unexpected weight change.  HENT: Negative for congestion, hearing loss, rhinorrhea and sore throat.   Eyes: Negative for visual disturbance.  Respiratory: Negative for cough and shortness of breath.   Cardiovascular: Negative for chest pain.  Gastrointestinal: Negative for abdominal pain, diarrhea and constipation.  Genitourinary: Negative for dysuria and flank pain.  Musculoskeletal: Negative for joint swelling and arthralgias.  Skin: Negative for rash.  Neurological: Negative for dizziness and headaches.  Psychiatric/Behavioral: Negative for sleep disturbance and dysphoric mood.    Objective:  BP 129/67 mmHg  Pulse 62  Temp(Src) 96.5 F (35.8 C) (Oral)  Ht _0  (1.651 m)  Wt 204 lb 12.8 oz (92.897 kg)  BMI 34.08 kg/m2  SpO2 98%  BP Readings from Last 3 Encounters:  07/05/15 129/67  03/15/15 117/63  02/23/15 119/66    Wt Readings from Last 3 Encounters:  07/05/15 204 lb 12.8 oz (92.897 kg)  03/19/15 210 lb (95.255 kg)  03/15/15 209 lb (94.802 kg)     Physical Exam  Constitutional: He is oriented to person, place, and time. He appears well-developed and well-nourished. No distress.  HENT:  Head: Normocephalic and atraumatic.  Right Ear: External ear normal.  Left Ear: External ear normal.  Nose: Nose normal.  Mouth/Throat: Oropharynx is clear and moist.  Eyes: Conjunctivae and EOM are normal. Pupils are equal, round, and reactive to light.  Neck: Normal range of motion. Neck supple. No thyromegaly present.  Cardiovascular: Normal rate, regular rhythm and normal heart sounds.   No murmur heard. Pulmonary/Chest: Effort normal and breath sounds normal. No respiratory distress.  He has no wheezes. He has no rales.  Abdominal: Soft. Bowel sounds are normal. He exhibits no distension. There is no tenderness.  Lymphadenopathy:    He has no cervical adenopathy.  Neurological: He is alert and oriented to person, place, and time. He has normal reflexes.  Skin: Skin is warm and dry.  Psychiatric: He has a normal mood and affect. His behavior is normal. Judgment and thought content normal.    Lab Results  Component Value Date   HGBA1C 6.8 07/05/2015   HGBA1C 7.1 01/08/2015   HGBA1C 6.8% 05/01/2014    Lab Results  Component Value Date   WBC 7.2 01/15/2015   HGB 12.8* 06/16/2014   HCT 43.3 01/15/2015   PLT 194 01/15/2015   GLUCOSE 155* 01/15/2015   CHOL 105 01/08/2015   TRIG 117 01/08/2015   HDL 31* 01/08/2015   LDLCALC 51 01/08/2015   ALT 18 01/15/2015   AST 20 01/15/2015   NA 140 01/15/2015   K 4.0 01/15/2015   CL 99 01/15/2015   CREATININE 1.27 01/15/2015   BUN 14 01/15/2015   CO2 23 01/15/2015   TSH 4.030 01/08/2015   INR 1.7* 06/25/2007   HGBA1C 6.8 07/05/2015    Mr Lumbar Spine Wo Contrast  03/19/2015  CLINICAL DATA:  Low back pain with BILATERAL leg weakness following  a fall. EXAM: MRI LUMBAR SPINE WITHOUT CONTRAST TECHNIQUE: Multiplanar, multisequence MR imaging of the lumbar spine was performed. No intravenous contrast was administered. COMPARISON:  CT abdomen pelvis 08/11/2010. FINDINGS: Segmentation: Normal. Alignment:  4 mm anterolisthesis L4-5. Vertebrae: No worrisome osseous lesion.Chronic endplate deformity superior L5 related to large Schmorl's node. Similar chronic endplate deformity L4. No associated bone marrow edema in of significance at either level. Prominent hemangioma posteriorly on the RIGHT L1. Conus medullaris: Normal in size, signal, and location. Paraspinal tissues: No evidence for hydronephrosis or paravertebral mass. Disc levels: L1-L2:  Normal. L2-L3:  Normal. L3-L4:  Mild bulge.  Mild facet arthropathy.  No impingement. L4-L5:  4 mm anterolisthesis. Advanced facet arthropathy and ligamentum flavum hypertrophy. Moderate canal stenosis with subarticular zone narrowing. Minor annular bulging. L5 nerve root impingement not established. L5-S1: Shallow central protrusion. Disc material, representing annular rent or annular bulging extends into the RIGHT neural foramen. Moderate facet and ligamentum flavum hypertrophy. Mild central canal stenosis. Possible RIGHT L5 nerve root irritation, but no definite S1 nerve root compromise. Compared with the previous CT abdomen, similar appearance noted. IMPRESSION: 4 mm anterolisthesis at L4-5 relates to facet disease. Moderate canal stenosis with subarticular zone narrowing but no definite L5 nerve root impingement. Shallow protrusion at L5-S1 with a rightward foraminal component. Cannot exclude RIGHT L5 nerve root impingement. Electronically Signed   By: Staci Righter M.D.   On: 03/19/2015 10:42    Assessment & Plan:   Detavious was seen today for diabetes, hypothyroidism, dementia and hypertension.  Diagnoses and all orders for this visit:  Hypothyroidism, unspecified hypothyroidism type -     TSH + free T4; Standing -     TSH + free T4  Diabetic polyneuropathy associated with type 2 diabetes mellitus (HCC) -     POCT glycosylated hemoglobin (Hb A1C); Standing -     CMP14+EGFR; Standing -     Lipid panel; Standing -     TSH + free T4; Standing -     POCT glycosylated hemoglobin (Hb A1C) -     CMP14+EGFR -     Lipid panel -     TSH + free T4  Chronic back pain -     TSH + free T4; Standing -     TSH + free T4  Essential hypertension -     TSH + free T4; Standing -     TSH + free T4  Hyperlipidemia -     Lipid panel; Standing -     Lipid panel   I am having Mr. Gandolfo maintain his aspirin, Fish Oil, NATURAL SENNA LAXATIVE, albuterol, blood glucose meter kit and supplies, glucose blood, triamcinolone cream, clobetasol ointment, levothyroxine, traMADol, blood glucose meter  kit and supplies, donepezil, and hydrOXYzine.  No orders of the defined types were placed in this encounter.     Follow-up: Return in about 3 months (around 10/02/2015).  Claretta Fraise, M.D.

## 2015-07-06 LAB — CMP14+EGFR
ALBUMIN: 3.7 g/dL (ref 3.5–4.7)
ALT: 19 IU/L (ref 0–44)
AST: 18 IU/L (ref 0–40)
Albumin/Globulin Ratio: 1.5 (ref 1.1–2.5)
Alkaline Phosphatase: 70 IU/L (ref 39–117)
BUN / CREAT RATIO: 12 (ref 10–22)
BUN: 18 mg/dL (ref 8–27)
Bilirubin Total: 0.4 mg/dL (ref 0.0–1.2)
CALCIUM: 8.9 mg/dL (ref 8.6–10.2)
CHLORIDE: 98 mmol/L (ref 96–106)
CO2: 25 mmol/L (ref 18–29)
CREATININE: 1.5 mg/dL — AB (ref 0.76–1.27)
GFR calc non Af Amer: 41 mL/min/{1.73_m2} — ABNORMAL LOW (ref >59)
GFR, EST AFRICAN AMERICAN: 47 mL/min/{1.73_m2} — AB (ref >59)
GLUCOSE: 125 mg/dL — AB (ref 65–99)
Globulin, Total: 2.5 g/dL (ref 1.5–4.5)
Potassium: 3.6 mmol/L (ref 3.5–5.2)
Sodium: 139 mmol/L (ref 134–144)
TOTAL PROTEIN: 6.2 g/dL (ref 6.0–8.5)

## 2015-07-06 LAB — LIPID PANEL
Chol/HDL Ratio: 4 ratio units (ref 0.0–5.0)
Cholesterol, Total: 115 mg/dL (ref 100–199)
HDL: 29 mg/dL — AB (ref >39)
LDL CALC: 36 mg/dL (ref 0–99)
Triglycerides: 249 mg/dL — ABNORMAL HIGH (ref 0–149)
VLDL CHOLESTEROL CAL: 50 mg/dL — AB (ref 5–40)

## 2015-07-06 LAB — TSH+FREE T4
Free T4: 1.27 ng/dL (ref 0.82–1.77)
TSH: 1.6 u[IU]/mL (ref 0.450–4.500)

## 2015-07-15 DIAGNOSIS — L84 Corns and callosities: Secondary | ICD-10-CM | POA: Diagnosis not present

## 2015-07-15 DIAGNOSIS — B351 Tinea unguium: Secondary | ICD-10-CM | POA: Diagnosis not present

## 2015-07-15 DIAGNOSIS — E1151 Type 2 diabetes mellitus with diabetic peripheral angiopathy without gangrene: Secondary | ICD-10-CM | POA: Diagnosis not present

## 2015-07-20 ENCOUNTER — Ambulatory Visit (INDEPENDENT_AMBULATORY_CARE_PROVIDER_SITE_OTHER): Payer: Commercial Managed Care - HMO

## 2015-07-20 ENCOUNTER — Ambulatory Visit (INDEPENDENT_AMBULATORY_CARE_PROVIDER_SITE_OTHER): Payer: Commercial Managed Care - HMO | Admitting: Family Medicine

## 2015-07-20 ENCOUNTER — Encounter: Payer: Self-pay | Admitting: Family Medicine

## 2015-07-20 VITALS — BP 129/64 | HR 60 | Temp 96.7°F | Ht 65.0 in | Wt 204.0 lb

## 2015-07-20 DIAGNOSIS — S6000XA Contusion of unspecified finger without damage to nail, initial encounter: Secondary | ICD-10-CM

## 2015-07-20 DIAGNOSIS — M25551 Pain in right hip: Secondary | ICD-10-CM | POA: Diagnosis not present

## 2015-07-20 DIAGNOSIS — IMO0001 Reserved for inherently not codable concepts without codable children: Secondary | ICD-10-CM

## 2015-07-20 DIAGNOSIS — S40021A Contusion of right upper arm, initial encounter: Secondary | ICD-10-CM

## 2015-07-20 DIAGNOSIS — S40011A Contusion of right shoulder, initial encounter: Secondary | ICD-10-CM

## 2015-07-20 DIAGNOSIS — S7001XA Contusion of right hip, initial encounter: Secondary | ICD-10-CM

## 2015-07-20 NOTE — Progress Notes (Signed)
Subjective:  Patient ID: [name], male    DOB: 1924-08-13  Age: 82 y.o. MRN: [id number]  CC: Hip Pain   HPI [name] presents for patient was walking to the bathroom with a walker. When he got to the bathroom. At the [facility] side and stepped into the bathroom. Unfortunately he was just wearing socks and his feet slipped out from under him. He landed on his right side. He now complains of pain in the anterior right shoulder lateral right elbow the right second and third fingers and primarily in the right hip. The pain is moderate. Unable to classify between 1-10/10. He has been walking with walker until this occurred but now is wheelchair-bound.   History [name] has a past medical history of Diabetes mellitus; Hypertension; History of pneumonia; Hemorrhoids; MI (myocardial infarction) ([place]) (1996); GERD (gastroesophageal reflux disease); Hypothyroidism; Diverticulosis; Aortic insufficiency; Aortic valve sclerosis; Dyslipidemia; Hard of hearing; and Small bowel obstruction ([place]).   He has past surgical history that includes Exploratory laparotomy w/ bowel resection; Transurethral resection of prostate; Cholecystectomy; Colonoscopy (2008); Abdominal hernia repair; and Cataract extraction.   His family history is negative for Colon cancer.He reports that he quit smoking about 72 years ago. He has never used smokeless tobacco. He reports that he does not drink alcohol or use illicit drugs.    ROS Review of Systems  Constitutional: Negative for fever, chills, diaphoresis and unexpected weight change.  HENT: Negative for congestion, hearing loss, rhinorrhea and sore throat.   Eyes: Negative for visual disturbance.  Respiratory: Negative for cough and shortness of breath.   Cardiovascular: Negative for chest pain.  Gastrointestinal: Negative for abdominal pain, diarrhea and constipation.  Genitourinary: Negative for dysuria and flank pain.  Musculoskeletal: Negative for joint  swelling and arthralgias.  Skin: Negative for rash.  Neurological: Negative for dizziness and headaches.  Psychiatric/Behavioral: Negative for sleep disturbance and dysphoric mood.    Objective:  BP 129/64 mmHg  Pulse 60  Temp(Src) 96.7 F (35.9 C) (Oral)  Ht _0  (1.651 m)  Wt 204 lb (92.534 kg)  BMI 33.95 kg/m2  SpO2 98%  BP Readings from Last 3 Encounters:  07/20/15 129/64  07/05/15 129/67  03/15/15 117/63    Wt Readings from Last 3 Encounters:  07/20/15 204 lb (92.534 kg)  07/05/15 204 lb 12.8 oz (92.897 kg)  03/19/15 210 lb (95.255 kg)     Physical Exam  Constitutional: He appears well-developed and well-nourished.  HENT:  Head: Normocephalic and atraumatic.  Right Ear: Tympanic membrane and external ear normal. No decreased hearing is noted.  Left Ear: Tympanic membrane and external ear normal. No decreased hearing is noted.  Mouth/Throat: No oropharyngeal exudate or posterior oropharyngeal erythema.  Eyes: Pupils are equal, round, and reactive to light.  Neck: Normal range of motion. Neck supple.  Cardiovascular: Normal rate and regular rhythm.   No murmur heard. Pulmonary/Chest: Breath sounds normal. No respiratory distress.  Abdominal: Soft. Bowel sounds are normal. He exhibits no mass. There is no tenderness.  Musculoskeletal: He exhibits tenderness (anterior aspect of the right glenohumeral joint, the right 2-3 fingers at PIPs, right lateral elbow and right lesser trochanter). He exhibits no edema.  Vitals reviewed.    Lab Results  Component Value Date   WBC 7.2 01/15/2015   HGB 12.8* 06/16/2014   HCT 43.3 01/15/2015   PLT 194 01/15/2015   GLUCOSE 125* 07/05/2015   CHOL 115 07/05/2015   TRIG 249* 07/05/2015   HDL 29* 07/05/2015  LDLCALC 36 07/05/2015   ALT 19 07/05/2015   AST 18 07/05/2015   NA 139 07/05/2015   K 3.6 07/05/2015   CL 98 07/05/2015   CREATININE 1.50* 07/05/2015   BUN 18 07/05/2015   CO2 25 07/05/2015   TSH 1.600 07/05/2015     INR 1.7* 06/25/2007   HGBA1C 6.8 07/05/2015    Mr Lumbar Spine Wo Contrast  03/19/2015  CLINICAL DATA:  Low back pain with BILATERAL leg weakness following a fall. EXAM: MRI LUMBAR SPINE WITHOUT CONTRAST TECHNIQUE: Multiplanar, multisequence MR imaging of the lumbar spine was performed. No intravenous contrast was administered. COMPARISON:  CT abdomen pelvis 08/11/2010. FINDINGS: Segmentation: Normal. Alignment:  4 mm anterolisthesis L4-5. Vertebrae: No worrisome osseous lesion.Chronic endplate deformity superior L5 related to large Schmorl's node. Similar chronic endplate deformity L4. No associated bone marrow edema in of significance at either level. Prominent hemangioma posteriorly on the RIGHT L1. Conus medullaris: Normal in size, signal, and location. Paraspinal tissues: No evidence for hydronephrosis or paravertebral mass. Disc levels: L1-L2:  Normal. L2-L3:  Normal. L3-L4:  Mild bulge.  Mild facet arthropathy.  No impingement. L4-L5: 4 mm anterolisthesis. Advanced facet arthropathy and ligamentum flavum hypertrophy. Moderate canal stenosis with subarticular zone narrowing. Minor annular bulging. L5 nerve root impingement not established. L5-S1: Shallow central protrusion. Disc material, representing annular rent or annular bulging extends into the RIGHT neural foramen. Moderate facet and ligamentum flavum hypertrophy. Mild central canal stenosis. Possible RIGHT L5 nerve root irritation, but no definite S1 nerve root compromise. Compared with the previous CT abdomen, similar appearance noted. IMPRESSION: 4 mm anterolisthesis at L4-5 relates to facet disease. Moderate canal stenosis with subarticular zone narrowing but no definite L5 nerve root impingement. Shallow protrusion at L5-S1 with a rightward foraminal component. Cannot exclude RIGHT L5 nerve root impingement. Electronically Signed   By: Staci Righter M.D.   On: 03/19/2015 10:42    Assessment & Plan:   [name] was seen today for hip  pain.  Diagnoses and all orders for this visit:  Right hip pain -     DG HIP UNILAT W OR W/O PELVIS 2-3 VIEWS RIGHT  Contusion, hip, right, initial encounter  Contusion shoulder/arm, right, initial encounter -     DG Shoulder Right; Future -     DG Elbow 2 Views Right; Future  Contusion of finger of right hand, initial encounter -     DG Hand Complete Right; Future    XR - no fx of any examined area noted  I am having Mr. Davisson maintain his aspirin, Fish Oil, NATURAL SENNA LAXATIVE, albuterol, blood glucose meter kit and supplies, glucose blood, triamcinolone cream, clobetasol ointment, levothyroxine, traMADol, blood glucose meter kit and supplies, donepezil, hydrOXYzine, furosemide, amLODipine-benazepril, omeprazole, gabapentin, metoprolol succinate, simvastatin, and JANUVIA.  No orders of the defined types were placed in this encounter.   Ice, increase tramadol to 2 at a time a bedtime for pain.  Follow-up: Return if symptoms worsen or fail to improve and as previously  recommended for routine care.  Claretta Fraise, M.D.

## 2015-07-23 ENCOUNTER — Telehealth: Payer: Self-pay | Admitting: Family Medicine

## 2015-07-23 DIAGNOSIS — Z0289 Encounter for other administrative examinations: Secondary | ICD-10-CM

## 2015-07-23 NOTE — Telephone Encounter (Signed)
Covering for Dr. Livia Snellen  I see that he fell a few days ago. If he is getting worse he should be seen again.   He has been on tramadol previously I am ok given a refill for that.   If he needs stronger than tramadol he will have to be seen.   Laroy Apple, MD [facility] 07/23/2015, 10:08 AM

## 2015-07-23 NOTE — Telephone Encounter (Signed)
Patient of Dr Livia Snellen. Can you review and route to [facility]

## 2015-07-23 NOTE — Telephone Encounter (Signed)
Pt aware and he was given appt with Dr.Stacks 2/27 at 10:10. Pt still has some tramadol left and is taking it.

## 2015-07-26 ENCOUNTER — Ambulatory Visit: Payer: Commercial Managed Care - HMO | Admitting: Family Medicine

## 2015-07-27 ENCOUNTER — Encounter: Payer: Self-pay | Admitting: Family Medicine

## 2015-08-03 ENCOUNTER — Other Ambulatory Visit: Payer: Self-pay | Admitting: Family Medicine

## 2015-08-05 ENCOUNTER — Telehealth: Payer: Self-pay | Admitting: Family Medicine

## 2015-08-05 NOTE — Telephone Encounter (Signed)
Up front, daughter aware

## 2015-08-08 ENCOUNTER — Encounter (HOSPITAL_COMMUNITY): Payer: Self-pay

## 2015-08-08 ENCOUNTER — Emergency Department (HOSPITAL_COMMUNITY)
Admission: EM | Admit: 2015-08-08 | Discharge: 2015-08-08 | Disposition: A | Discharge status: Home / Self Care | Payer: Commercial Managed Care - HMO | Attending: Emergency Medicine | Admitting: Emergency Medicine

## 2015-08-08 ENCOUNTER — Emergency Department (HOSPITAL_COMMUNITY): Payer: Commercial Managed Care - HMO

## 2015-08-08 DIAGNOSIS — E039 Hypothyroidism, unspecified: Secondary | ICD-10-CM | POA: Insufficient documentation

## 2015-08-08 DIAGNOSIS — I10 Essential (primary) hypertension: Secondary | ICD-10-CM | POA: Insufficient documentation

## 2015-08-08 DIAGNOSIS — E119 Type 2 diabetes mellitus without complications: Secondary | ICD-10-CM | POA: Diagnosis not present

## 2015-08-08 DIAGNOSIS — I252 Old myocardial infarction: Secondary | ICD-10-CM | POA: Diagnosis not present

## 2015-08-08 DIAGNOSIS — Z7982 Long term (current) use of aspirin: Secondary | ICD-10-CM | POA: Diagnosis not present

## 2015-08-08 DIAGNOSIS — E860 Dehydration: Secondary | ICD-10-CM

## 2015-08-08 DIAGNOSIS — Z79899 Other long term (current) drug therapy: Secondary | ICD-10-CM | POA: Insufficient documentation

## 2015-08-08 DIAGNOSIS — R4182 Altered mental status, unspecified: Secondary | ICD-10-CM | POA: Diagnosis present

## 2015-08-08 DIAGNOSIS — I517 Cardiomegaly: Secondary | ICD-10-CM | POA: Diagnosis not present

## 2015-08-08 DIAGNOSIS — R404 Transient alteration of awareness: Secondary | ICD-10-CM | POA: Diagnosis not present

## 2015-08-08 DIAGNOSIS — Z87891 Personal history of nicotine dependence: Secondary | ICD-10-CM | POA: Insufficient documentation

## 2015-08-08 DIAGNOSIS — R402421 Glasgow coma scale score 9-12, in the field [EMT or ambulance]: Secondary | ICD-10-CM | POA: Diagnosis not present

## 2015-08-08 HISTORY — DX: Bronchitis, not specified as acute or chronic: J40

## 2015-08-08 LAB — CBC WITH DIFFERENTIAL/PLATELET
BASOS ABS: 0.1 10*3/uL (ref 0.0–0.1)
Basophils Relative: 1 %
Eosinophils Absolute: 0.1 10*3/uL (ref 0.0–0.7)
Eosinophils Relative: 2 %
HEMATOCRIT: 43.2 % (ref 39.0–52.0)
Hemoglobin: 14.5 g/dL (ref 13.0–17.0)
LYMPHS ABS: 2 10*3/uL (ref 0.7–4.0)
LYMPHS PCT: 25 %
MCH: 29.4 pg (ref 26.0–34.0)
MCHC: 33.6 g/dL (ref 30.0–36.0)
MCV: 87.4 fL (ref 78.0–100.0)
MONO ABS: 0.6 10*3/uL (ref 0.1–1.0)
MONOS PCT: 7 %
NEUTROS ABS: 5.4 10*3/uL (ref 1.7–7.7)
Neutrophils Relative %: 65 %
Platelets: 181 10*3/uL (ref 150–400)
RBC: 4.94 MIL/uL (ref 4.22–5.81)
RDW: 13.3 % (ref 11.5–15.5)
WBC: 8.1 10*3/uL (ref 4.0–10.5)

## 2015-08-08 LAB — BASIC METABOLIC PANEL
ANION GAP: 7 (ref 5–15)
BUN: 16 mg/dL (ref 6–20)
CALCIUM: 8.9 mg/dL (ref 8.9–10.3)
CO2: 29 mmol/L (ref 22–32)
Chloride: 100 mmol/L — ABNORMAL LOW (ref 101–111)
Creatinine, Ser: 1.49 mg/dL — ABNORMAL HIGH (ref 0.61–1.24)
GFR calc Af Amer: 46 mL/min — ABNORMAL LOW (ref >60)
GFR calc non Af Amer: 40 mL/min — ABNORMAL LOW (ref >60)
GLUCOSE: 144 mg/dL — AB (ref 65–99)
Potassium: 3.4 mmol/L — ABNORMAL LOW (ref 3.5–5.1)
Sodium: 136 mmol/L (ref 135–145)

## 2015-08-08 LAB — BLOOD GAS, ARTERIAL
Acid-base deficit: 1.3 mmol/L (ref 0.0–2.0)
Bicarbonate: 24 mEq/L (ref 20.0–24.0)
Drawn by: 23534
FIO2: 0.21
O2 CONTENT: 21 L/min
O2 Saturation: 99.2 %
PCO2 ART: 30.2 mmHg — AB (ref 35.0–45.0)
PH ART: 7.473 — AB (ref 7.350–7.450)
pO2, Arterial: 155 mmHg — ABNORMAL HIGH (ref 80.0–100.0)

## 2015-08-08 LAB — AMMONIA: Ammonia: 17 umol/L (ref 9–35)

## 2015-08-08 LAB — URINALYSIS, ROUTINE W REFLEX MICROSCOPIC
Bilirubin Urine: NEGATIVE
GLUCOSE, UA: NEGATIVE mg/dL
Hgb urine dipstick: NEGATIVE
Ketones, ur: NEGATIVE mg/dL
LEUKOCYTES UA: NEGATIVE
Nitrite: NEGATIVE
PH: 6.5 (ref 5.0–8.0)
Protein, ur: NEGATIVE mg/dL
SPECIFIC GRAVITY, URINE: 1.01 (ref 1.005–1.030)

## 2015-08-08 LAB — LACTIC ACID, PLASMA: Lactic Acid, Venous: 1.5 mmol/L (ref 0.5–2.0)

## 2015-08-08 LAB — TROPONIN I: Troponin I: <0.03 ng/mL (ref <0.031)

## 2015-08-08 MED ORDER — VANCOMYCIN HCL 10 G IV SOLR
1500.0000 mg | Freq: Once | INTRAVENOUS | Status: AC
  Administered 2015-08-08: 1500 mg via INTRAVENOUS
  Filled 2015-08-08: qty 1500

## 2015-08-08 MED ORDER — PIPERACILLIN-TAZOBACTAM 3.375 G IVPB 30 MIN
3.3750 g | Freq: Once | INTRAVENOUS | Status: AC
  Administered 2015-08-08: 3.375 g via INTRAVENOUS
  Filled 2015-08-08: qty 50

## 2015-08-08 MED ORDER — SODIUM CHLORIDE 0.9 % IV BOLUS (SEPSIS)
1000.0000 mL | INTRAVENOUS | Status: AC
  Administered 2015-08-08 (×3): 1000 mL via INTRAVENOUS

## 2015-08-08 MED ORDER — PREDNISONE 50 MG PO TABS
60.0000 mg | ORAL_TABLET | Freq: Once | ORAL | Status: AC
  Administered 2015-08-08: 60 mg via ORAL
  Filled 2015-08-08: qty 1

## 2015-08-08 MED ORDER — ALBUTEROL SULFATE (2.5 MG/3ML) 0.083% IN NEBU
5.0000 mg | INHALATION_SOLUTION | Freq: Once | RESPIRATORY_TRACT | Status: AC
  Administered 2015-08-08: 5 mg via RESPIRATORY_TRACT
  Filled 2015-08-08: qty 6

## 2015-08-08 NOTE — ED Notes (Signed)
Pt given water and snack to eat per MD request. Pt tolerating well.

## 2015-08-08 NOTE — ED Notes (Signed)
No family here at present to provide history

## 2015-08-08 NOTE — Discharge Instructions (Signed)
Continue to offer him fluids, frequently. Follow-up with his doctor this week as scheduled.   Dehydration, Adult Dehydration is a condition in which you do not have enough fluid or water in your body. It happens when you take in less fluid than you lose. Vital organs such as the kidneys, brain, and heart cannot function without a proper amount of fluids. Any loss of fluids from the body can cause dehydration.  Dehydration can range from mild to severe. This condition should be treated right away to help prevent it from becoming severe. CAUSES  This condition may be caused by:  Vomiting.  Diarrhea.  Excessive sweating, such as when exercising in hot or humid weather.  Not drinking enough fluid during strenuous exercise or during an illness.  Excessive urine output.  Fever.  Certain medicines. RISK FACTORS This condition is more likely to develop in:  People who are taking certain medicines that cause the body to lose excess fluid (diuretics).   People who have a chronic illness, such as diabetes, that may increase urination.  Older adults.   People who live at high altitudes.   People who participate in endurance sports.  SYMPTOMS  Mild Dehydration  Thirst.  Dry lips.  Slightly dry mouth.  Dry, warm skin. Moderate Dehydration  Very dry mouth.   Muscle cramps.   Dark urine and decreased urine production.   Decreased tear production.   Headache.   Light-headedness, especially when you stand up from a sitting position.  Severe Dehydration  Changes in skin.   Cold and clammy skin.   Skin does not spring back quickly when lightly pinched and released.   Changes in body fluids.   Extreme thirst.   No tears.   Not able to sweat when body temperature is high, such as in hot weather.   Minimal urine production.   Changes in vital signs.   Rapid, weak pulse (more than 100 beats per minute when you are sitting still).   Rapid  breathing.   Low blood pressure.   Other changes.   Sunken eyes.   Cold hands and feet.   Confusion.  Lethargy and difficulty being awakened.  Fainting (syncope).   Short-term weight loss.   Unconsciousness. DIAGNOSIS  This condition may be diagnosed based on your symptoms. You may also have tests to determine how severe your dehydration is. These tests may include:   Urine tests.   Blood tests.  TREATMENT  Treatment for this condition depends on the severity. Mild or moderate dehydration can often be treated at home. Treatment should be started right away. Do not wait until dehydration becomes severe. Severe dehydration needs to be treated at the hospital. Treatment for Mild Dehydration  Drinking plenty of water to replace the fluid you have lost.   Replacing minerals in your blood (electrolytes) that you may have lost.  Treatment for Moderate Dehydration  Consuming oral rehydration solution (ORS). Treatment for Severe Dehydration  Receiving fluid through an IV tube.   Receiving electrolyte solution through a feeding tube that is passed through your nose and into your stomach (nasogastric tube or NG tube).  Correcting any abnormalities in electrolytes. HOME CARE INSTRUCTIONS   Drink enough fluid to keep your urine clear or pale yellow.   Drink water or fluid slowly by taking small sips. You can also try sucking on ice cubes.  Have food or beverages that contain electrolytes. Examples include bananas and sports drinks.  Take over-the-counter and prescription medicines only as told  by your health care provider.   Prepare ORS according to the manufacturer's instructions. Take sips of ORS every 5 minutes until your urine returns to normal.  If you have vomiting or diarrhea, continue to try to drink water, ORS, or both.   If you have diarrhea, avoid:   Beverages that contain caffeine.   Fruit juice.   Milk.   Carbonated soft  drinks.  Do not take salt tablets. This can lead to the condition of having too much sodium in your body (hypernatremia).  SEEK MEDICAL CARE IF:  You cannot eat or drink without vomiting.  You have had moderate diarrhea during a period of more than 24 hours.  You have a fever. SEEK IMMEDIATE MEDICAL CARE IF:   You have extreme thirst.  You have severe diarrhea.  You have not urinated in 6-8 hours, or you have urinated only a small amount of very dark urine.  You have shriveled skin.  You are dizzy, confused, or both.   This information is not intended to replace advice given to you by your health care provider. Make sure you discuss any questions you have with your health care provider.   Document Released: 05/15/2005 Document Revised: 02/03/2015 Document Reviewed: 09/30/2014 Elsevier Interactive Patient Education [employer].

## 2015-08-08 NOTE — ED Notes (Signed)
MD aware that Vanocomycin dose is not finished. Ok for pt to be discharged prior to Vancomycin finishing per MD order.

## 2015-08-08 NOTE — ED Provider Notes (Signed)
CSN: [id number]     Arrival date & time 08/08/15  1433 History   First MD Initiated Contact with Patient 08/08/15 1450     Chief Complaint  Patient presents with  . Altered Mental Status     (Consider location/radiation/quality/duration/timing/severity/associated sxs/prior Treatment) HPI   [name] is a 82 y.o. male here for evaluation of altered mental status. Patient was found altered. When family members returned from church, a couple of hours ago. When they left for church earlier this morning, he was alert and lucid, and his usual baseline. He was able to eat breakfast this morning as usual. He has had decreased ambulation recently, for 2 weeks, because of an injury to the right hip. This is been previously evaluated with imaging and found to be negative. His daughter is planning on asking his PCP to order an MRI to evaluate the hip further. Family members state that he does not drink much fluid, which is an ongoing problem for several months. There's been no noted fever, chills, vomiting, cough or other recent medical problems. Patient is unable to give history.  Level V caveat- altered mental status  Past Medical History  Diagnosis Date  . Diabetes mellitus     Diet control   . Hypertension   . History of pneumonia   . Hemorrhoids   . MI (myocardial infarction) ([place]) 1996    percutaneous coronary intervention   . GERD (gastroesophageal reflux disease)   . Hypothyroidism   . Diverticulosis   . Aortic insufficiency   . Aortic valve sclerosis   . Dyslipidemia   . Hard of hearing   . Small bowel obstruction ([place])   . Bronchitis    Past Surgical History  Procedure Laterality Date  . Exploratory laparotomy w/ bowel resection      ventral hernia repair  . Transurethral resection of prostate    . Cholecystectomy    . Colonoscopy  2008  . Abdominal hernia repair      multiple sx's  . Cataract extraction      left eye   Family History  Problem Relation Age of Onset   . Colon cancer Neg Hx    Social History  Substance Use Topics  . Smoking status: Former Smoker    Quit date: 08/09/1942  . Smokeless tobacco: Never Used  . Alcohol Use: No    Review of Systems  All other systems reviewed and are negative.     Allergies  Review of patient's allergies indicates no known allergies.  Home Medications   Prior to Admission medications   Medication Sig Start Date End Date Taking? Authorizing Provider  albuterol (PROVENTIL) (2.5 MG/3ML) 0.083% nebulizer solution Take 3 mLs (2.5 mg total) by nebulization every 6 (six) hours as needed for wheezing or shortness of breath. 07/02/14   Chipper Herb, MD  amLODipine-benazepril (LOTREL) 5-20 MG capsule TAKE (1) CAPSULE DAILY 07/05/15   Claretta Fraise, MD  aspirin 81 MG tablet Take 81 mg by mouth daily.    Historical Provider, MD  blood glucose meter kit and supplies KIT Dispense based on patient and insurance. Test bid. E11.9 10/19/14   Chipper Herb, MD  blood glucose meter kit and supplies KIT Dispense based on patient and insurance preference. Use up to four times daily as directed. (FOR ICD-9 250.00, 250.01). 03/15/15   Claretta Fraise, MD  clobetasol ointment (TEMOVATE) 8.67 % Apply 1 application topically 2 (two) times daily. 01/15/15   Tiffany A Gann, PA-C  donepezil (  ARICEPT) 10 MG tablet Take 2 tablets (20 mg total) by mouth at bedtime. Patient taking differently: Take 10 mg by mouth at bedtime.  03/15/15   Claretta Fraise, MD  furosemide (LASIX) 40 MG tablet TAKE 1 TABLET DAILY 07/05/15   Claretta Fraise, MD  gabapentin (NEURONTIN) 300 MG capsule Take 1 capsule (300 mg total) by mouth at bedtime. 07/05/15   Claretta Fraise, MD  glucose blood test strip Check blood sugar bid. DX- E11.9 10/19/14   Chipper Herb, MD  hydrOXYzine (VISTARIL) 25 MG capsule Take 1 capsule (25 mg total) by mouth every 6 (six) hours as needed for anxiety. 06/25/15   Claretta Fraise, MD  JANUVIA 50 MG tablet TAKE 1 TABLET DAILY 07/05/15   Claretta Fraise, MD  levothyroxine (SYNTHROID, LEVOTHROID) 88 MCG tablet Take 1 tablet (88 mcg total) by mouth daily. 08/03/15   Claretta Fraise, MD  metoprolol succinate (TOPROL-XL) 50 MG 24 hr tablet TAKE 1 TABLET ONCE A DAY WITH A MEAL 07/05/15   Claretta Fraise, MD  Zack Seal Laxative 64.8-324 MG TABS Take 2 tablets by mouth at bedtime.     Historical Provider, MD  Omega-3 Fatty Acids (FISH OIL) 1000 MG CAPS Take 1 capsule by mouth daily.    Historical Provider, MD  omeprazole (PRILOSEC) 20 MG capsule TAKE (1) CAPSULE DAILY 07/05/15   Claretta Fraise, MD  simvastatin (ZOCOR) 20 MG tablet TAKE 1 TABLET DAILY 07/05/15   Claretta Fraise, MD  traMADol (ULTRAM) 50 MG tablet Take 1 tablet (50 mg total) by mouth 4 (four) times daily as needed for moderate pain. 02/23/15   Claretta Fraise, MD  triamcinolone cream (KENALOG) 0.1 % Apply 1 application topically 2 (two) times daily.    Historical Provider, MD   BP 144/69 mmHg  Pulse 65  Temp(Src) 97.3 F (36.3 C) (Rectal)  Resp 13  Ht 5' 5"  (1.651 m)  Wt 204 lb (92.534 kg)  BMI 33.95 kg/m2  SpO2 99% Physical Exam  Constitutional: He is oriented to person, place, and time. He appears well-developed and well-nourished. No distress.  HENT:  Head: Normocephalic and atraumatic.  Right Ear: External ear normal.  Left Ear: External ear normal.  Eyes: Conjunctivae and EOM are normal. Pupils are equal, round, and reactive to light.  Neck: Normal range of motion and phonation normal. Neck supple.  Cardiovascular: Normal rate, regular rhythm and normal heart sounds.   Pulmonary/Chest: Effort normal and breath sounds normal. He exhibits no bony tenderness.  Abdominal: Soft. There is no tenderness.  Musculoskeletal: Normal range of motion.  Neurological: He is alert and oriented to person, place, and time. No cranial nerve deficit or sensory deficit. He exhibits normal muscle tone. Coordination normal.  Skin: Skin is warm, dry and intact.  Psychiatric: He has a normal mood and  affect. His behavior is normal. Judgment and thought content normal.  Nursing note and vitals reviewed.   ED Course  Procedures (including critical care time)  Medications  sodium chloride 0.9 % bolus 1,000 mL (1,000 mLs Intravenous New Bag/Given 08/08/15 1556)  piperacillin-tazobactam (ZOSYN) IVPB 3.375 g (3.375 g Intravenous New Bag/Given 08/08/15 1555)  vancomycin (VANCOCIN) 1,500 mg in sodium chloride 0.9 % 500 mL IVPB (not administered)  albuterol (PROVENTIL) (2.5 MG/3ML) 0.083% nebulizer solution 5 mg (5 mg Nebulization Given 08/08/15 1525)  predniSONE (DELTASONE) tablet 60 mg (60 mg Oral Given 08/08/15 1523)    Patient Vitals for the past 24 hrs:  BP Temp Temp src Pulse Resp SpO2 Height Weight  08/08/15 1530 144/69 mmHg - - 65 13 99 % - -  08/08/15 1526 - - - - - 97 % - -  08/08/15 1500 120/59 mmHg - - 63 11 95 % - -  08/08/15 1445 145/67 mmHg 97.3 F (36.3 C) Rectal 69 10 96 % 5' 5"  (1.651 m) 204 lb (92.534 kg)    6:12 PM Reevaluation with update and discussion. After initial assessment and treatment, an updated evaluation reveals family members report that he is at his baseline now. He has been able to eat, and drink now. He is more alert, and even attempts to converse some which is usual for him. Findings discussed with patient's family members, all questions were answered. Jarryd Gratz L    Labs Review Labs Reviewed  URINE CULTURE  CULTURE, BLOOD (ROUTINE X 2)  CULTURE, BLOOD (ROUTINE X 2)  URINALYSIS, ROUTINE W REFLEX MICROSCOPIC (NOT AT [facility])  CBC WITH DIFFERENTIAL/PLATELET  LACTIC ACID, PLASMA  AMMONIA  BASIC METABOLIC PANEL  TROPONIN I  LACTIC ACID, PLASMA    Imaging Review Dg Chest Portable 1 View  08/08/2015  CLINICAL DATA:  Altered mental status. EXAM: PORTABLE CHEST 1 VIEW COMPARISON:  06/15/2014 FINDINGS: 1454 hours. Lung volumes are low The cardio pericardial silhouette is enlarged. Interstitial markings are diffusely coarsened with chronic features. No  focal airspace consolidation or pulmonary edema. The visualized bony structures of the thorax are intact. Telemetry leads overlie the chest. IMPRESSION: Cardiomegaly with low lung volumes and underlying chronic interstitial coarsening. Electronically Signed   By: Misty Stanley M.D.   On: 08/08/2015 15:06   I have personally reviewed and evaluated these images and lab results as part of my medical decision-making.   EKG Interpretation   Date/Time:  Sunday August 08 2015 14:45:07 EDT Ventricular Rate:  70 PR Interval:  200 QRS Duration: 124 QT Interval:  521 QTC Calculation: 562 R Axis:   -28 Text Interpretation:  Sinus rhythm IVCD, consider atypical LBBB since last  tracing no significant change Confirmed by Eulis Foster  MD, Aedan Geimer ([id number]) on  08/08/2015 3:57:53 PM      MDM   Final diagnoses:  Dehydration  Transient alteration of awareness    Altered mental status, transient, which improved with IV fluids. No evidence for serious bacterial infection. Metabolic instability, significant AKI, or impending vascular collapse. Screening for other causes of lethargy included blood gas testing, and ammonia. These were negative. Patient recovered his baseline and was stable for discharge. He has short-term follow-up plan for 2 days from now.  Nursing Notes Reviewed/ Care Coordinated Applicable Imaging Reviewed Interpretation of Laboratory Data incorporated into ED treatment  The patient appears reasonably screened and/or stabilized for discharge and I doubt any other medical condition or other [facility] requiring further screening, evaluation, or treatment in the ED at this time prior to discharge.  Plan: Home Medications- usual; Home Treatments- push IV fluids; return here if the recommended treatment, does not improve the symptoms; Recommended follow up- PCP in 2 days     Daleen Bo, MD 08/08/15 1816

## 2015-08-08 NOTE — ED Notes (Signed)
Per EMS, pt here for evaluation of altered mental status. Pt alert and follows commands. Stated he had to pee as quick as he arrived. Pt unable to use urinal. Foley inserted

## 2015-08-08 NOTE — ED Notes (Signed)
MD at bedside. 

## 2015-08-10 ENCOUNTER — Encounter: Payer: Self-pay | Admitting: Family Medicine

## 2015-08-10 ENCOUNTER — Ambulatory Visit (INDEPENDENT_AMBULATORY_CARE_PROVIDER_SITE_OTHER): Payer: Commercial Managed Care - HMO | Admitting: Family Medicine

## 2015-08-10 VITALS — BP 105/63 | HR 54 | Temp 96.8°F | Ht 65.0 in | Wt 206.0 lb

## 2015-08-10 DIAGNOSIS — E860 Dehydration: Secondary | ICD-10-CM

## 2015-08-10 DIAGNOSIS — I10 Essential (primary) hypertension: Secondary | ICD-10-CM | POA: Diagnosis not present

## 2015-08-10 DIAGNOSIS — F0280 Dementia in other diseases classified elsewhere without behavioral disturbance: Secondary | ICD-10-CM | POA: Insufficient documentation

## 2015-08-10 DIAGNOSIS — G8929 Other chronic pain: Secondary | ICD-10-CM | POA: Diagnosis not present

## 2015-08-10 DIAGNOSIS — G309 Alzheimer's disease, unspecified: Secondary | ICD-10-CM | POA: Diagnosis not present

## 2015-08-10 DIAGNOSIS — H9190 Unspecified hearing loss, unspecified ear: Secondary | ICD-10-CM | POA: Diagnosis not present

## 2015-08-10 DIAGNOSIS — M25551 Pain in right hip: Secondary | ICD-10-CM | POA: Diagnosis not present

## 2015-08-10 DIAGNOSIS — E1142 Type 2 diabetes mellitus with diabetic polyneuropathy: Secondary | ICD-10-CM | POA: Diagnosis not present

## 2015-08-10 DIAGNOSIS — M549 Dorsalgia, unspecified: Secondary | ICD-10-CM | POA: Diagnosis not present

## 2015-08-10 LAB — URINE CULTURE: Culture: NO GROWTH

## 2015-08-10 NOTE — Progress Notes (Signed)
Subjective:  Patient ID: [name], male    DOB: May 17, 1925  Age: 82 y.o. MRN: [id number]  CC: Hospitalization Follow-up   HPI RUAIRI STUTSMAN presents for Follow-up of dementia. He had an acute spell 2 days ago. He became unresponsive for a short time. He went to the emergency room and was given fluids due to hydration issues noted there. ER report reviewed from the attending there. Currently he is pain free according to the daughter. Patient denies pain as well. The right hip had been giving him problems ever since he fell one month ago. He was seen shortly after that, that office visit is attached and was reviewed. X-ray done at that time showed no fracture. He did see the pain management physician 2 months ago for his lower back due to the disc herniation. He has not been willing to go back. Initial management suggestion was the use of tramadol as needed for pain from Eloy's report was reviewed.  History Eran has a past medical history of Diabetes mellitus; Hypertension; History of pneumonia; Hemorrhoids; MI (myocardial infarction) ([place]) (1996); GERD (gastroesophageal reflux disease); Hypothyroidism; Diverticulosis; Aortic insufficiency; Aortic valve sclerosis; Dyslipidemia; Hard of hearing; Small bowel obstruction ([place]); and Bronchitis.   He has past surgical history that includes Exploratory laparotomy w/ bowel resection; Transurethral resection of prostate; Cholecystectomy; Colonoscopy (2008); Abdominal hernia repair; and Cataract extraction.   His family history is negative for Colon cancer.He reports that he quit smoking about 73 years ago. He has never used smokeless tobacco. He reports that he does not drink alcohol or use illicit drugs.    ROS Review of Systems  Constitutional: Negative for fever, chills, diaphoresis and unexpected weight change.  HENT: Negative for congestion, hearing loss, rhinorrhea and sore throat.   Eyes: Negative for visual disturbance.  Respiratory:  Negative for cough and shortness of breath.   Cardiovascular: Negative for chest pain.  Gastrointestinal: Negative for abdominal pain, diarrhea and constipation.  Genitourinary: Negative for dysuria and flank pain.  Musculoskeletal: Negative for myalgias, joint swelling and arthralgias.       Denies hip pain and other joint pain today. Daughter states that it seemed to miraculously clear up when he was given IV fluids for his dehydration 2 days ago.  Skin: Negative for rash.  Neurological: Negative for dizziness and headaches.  Psychiatric/Behavioral: Positive for behavioral problems and agitation. Negative for sleep disturbance and dysphoric mood.    Objective:  BP 105/63 mmHg  Pulse 54  Temp(Src) 96.8 F (36 C) (Oral)  Ht 5' 5" (1.651 m)  Wt 206 lb (93.441 kg)  BMI 34.28 kg/m2  BP Readings from Last 3 Encounters:  08/10/15 105/63  08/08/15 141/61  07/20/15 129/64    Wt Readings from Last 3 Encounters:  08/10/15 206 lb (93.441 kg)  08/08/15 204 lb (92.534 kg)  07/20/15 204 lb (92.534 kg)     Physical Exam  Constitutional: He is oriented to person, place, and time. He appears well-developed and well-nourished. No distress.  HENT:  Head: Normocephalic and atraumatic.  Right Ear: Tympanic membrane and external ear normal. No decreased hearing is noted.  Left Ear: Tympanic membrane and external ear normal. No decreased hearing is noted.  Nose: Nose normal.  Mouth/Throat: Oropharynx is clear and moist. No oropharyngeal exudate or posterior oropharyngeal erythema.  Eyes: Conjunctivae and EOM are normal. Pupils are equal, round, and reactive to light.  Neck: Normal range of motion. Neck supple. No thyromegaly present.  Cardiovascular: Normal rate, regular rhythm  and normal heart sounds.   No murmur heard. Pulmonary/Chest: Effort normal and breath sounds normal. No respiratory distress. He has no wheezes. He has no rales.  Abdominal: Soft. Bowel sounds are normal. He exhibits  no distension and no mass. There is no tenderness.  Lymphadenopathy:    He has no cervical adenopathy.  Neurological: He is alert and oriented to person, place, and time. He has normal reflexes.  Skin: Skin is warm and dry.  Psychiatric: He has a normal mood and affect. His behavior is normal. Judgment and thought content normal.  Vitals reviewed.    Lab Results  Component Value Date   WBC 8.1 08/08/2015   HGB 14.5 08/08/2015   HCT 43.2 08/08/2015   PLT 181 08/08/2015   GLUCOSE 144* 08/08/2015   CHOL 115 07/05/2015   TRIG 249* 07/05/2015   HDL 29* 07/05/2015   LDLCALC 36 07/05/2015   ALT 19 07/05/2015   AST 18 07/05/2015   NA 136 08/08/2015   K 3.4* 08/08/2015   CL 100* 08/08/2015   CREATININE 1.49* 08/08/2015   BUN 16 08/08/2015   CO2 29 08/08/2015   TSH 1.600 07/05/2015   INR 1.7* 06/25/2007   HGBA1C 6.8 07/05/2015    Dg Chest Portable 1 View  08/08/2015  CLINICAL DATA:  Altered mental status. EXAM: PORTABLE CHEST 1 VIEW COMPARISON:  06/15/2014 FINDINGS: 1454 hours. Lung volumes are low The cardio pericardial silhouette is enlarged. Interstitial markings are diffusely coarsened with chronic features. No focal airspace consolidation or pulmonary edema. The visualized bony structures of the thorax are intact. Telemetry leads overlie the chest. IMPRESSION: Cardiomegaly with low lung volumes and underlying chronic interstitial coarsening. Electronically Signed   By: Misty Stanley M.D.   On: 08/08/2015 15:06    Assessment & Plan:   Emmerich was seen today for hospitalization follow-up.  Diagnoses and all orders for this visit:  Dehydration -     CBC with Differential/Platelet -     CMP14+EGFR  Right hip pain -     CMP14+EGFR  Essential hypertension -     CMP14+EGFR  Diabetic polyneuropathy associated with type 2 diabetes mellitus (HCC) -     CMP14+EGFR  Chronic back pain -     CMP14+EGFR  Hard of hearing, unspecified laterality -     CMP14+EGFR  Alzheimer's  dementia -     CMP14+EGFR      I am having Mr. Revolorio maintain his aspirin, Fish Oil, NATURAL SENNA LAXATIVE, albuterol, blood glucose meter kit and supplies, glucose blood, clobetasol ointment, traMADol, blood glucose meter kit and supplies, donepezil, hydrOXYzine, furosemide, amLODipine-benazepril, omeprazole, gabapentin, metoprolol succinate, simvastatin, JANUVIA, levothyroxine, ketoconazole, mupirocin ointment, docusate sodium, and cholecalciferol.  No orders of the defined types were placed in this encounter.     Follow-up: Return in about 6 weeks (around 09/21/2015) for diabetes.  Claretta Fraise, M.D.

## 2015-08-11 LAB — CBC WITH DIFFERENTIAL/PLATELET
Basophils Absolute: 0.1 10*3/uL (ref 0.0–0.2)
Basos: 1 %
EOS (ABSOLUTE): 0.4 10*3/uL (ref 0.0–0.4)
EOS: 6 %
HEMATOCRIT: 39.3 % (ref 37.5–51.0)
HEMOGLOBIN: 13.1 g/dL (ref 12.6–17.7)
IMMATURE GRANS (ABS): 0 10*3/uL (ref 0.0–0.1)
Immature Granulocytes: 0 %
LYMPHS ABS: 1.2 10*3/uL (ref 0.7–3.1)
LYMPHS: 18 %
MCH: 29.2 pg (ref 26.6–33.0)
MCHC: 33.3 g/dL (ref 31.5–35.7)
MCV: 88 fL (ref 79–97)
MONOCYTES: 8 %
Monocytes Absolute: 0.5 10*3/uL (ref 0.1–0.9)
NEUTROS ABS: 4.5 10*3/uL (ref 1.4–7.0)
Neutrophils: 67 %
Platelets: 169 10*3/uL (ref 150–379)
RBC: 4.48 x10E6/uL (ref 4.14–5.80)
RDW: 14.3 % (ref 12.3–15.4)
WBC: 6.7 10*3/uL (ref 3.4–10.8)

## 2015-08-11 LAB — CMP14+EGFR
ALBUMIN: 3.4 g/dL — AB (ref 3.5–4.7)
ALK PHOS: 72 IU/L (ref 39–117)
ALT: 18 IU/L (ref 0–44)
AST: 19 IU/L (ref 0–40)
Albumin/Globulin Ratio: 1.2 (ref 1.2–2.2)
BILIRUBIN TOTAL: 0.2 mg/dL (ref 0.0–1.2)
BUN / CREAT RATIO: 14 (ref 10–22)
BUN: 19 mg/dL (ref 8–27)
CO2: 25 mmol/L (ref 18–29)
CREATININE: 1.32 mg/dL — AB (ref 0.76–1.27)
Calcium: 8.5 mg/dL — ABNORMAL LOW (ref 8.6–10.2)
Chloride: 98 mmol/L (ref 96–106)
GFR calc non Af Amer: 48 mL/min/{1.73_m2} — ABNORMAL LOW (ref >59)
GFR, EST AFRICAN AMERICAN: 55 mL/min/{1.73_m2} — AB (ref >59)
GLOBULIN, TOTAL: 2.8 g/dL (ref 1.5–4.5)
Glucose: 114 mg/dL — ABNORMAL HIGH (ref 65–99)
Potassium: 3.7 mmol/L (ref 3.5–5.2)
SODIUM: 138 mmol/L (ref 134–144)
TOTAL PROTEIN: 6.2 g/dL (ref 6.0–8.5)

## 2015-08-14 LAB — CULTURE, BLOOD (ROUTINE X 2)
Culture: NO GROWTH
Culture: NO GROWTH

## 2015-08-30 DIAGNOSIS — M6281 Muscle weakness (generalized): Secondary | ICD-10-CM | POA: Diagnosis not present

## 2015-08-31 ENCOUNTER — Encounter: Payer: Self-pay | Admitting: Family Medicine

## 2015-08-31 ENCOUNTER — Encounter (INDEPENDENT_AMBULATORY_CARE_PROVIDER_SITE_OTHER): Payer: Self-pay

## 2015-08-31 ENCOUNTER — Ambulatory Visit (INDEPENDENT_AMBULATORY_CARE_PROVIDER_SITE_OTHER): Payer: Commercial Managed Care - HMO | Admitting: Family Medicine

## 2015-08-31 VITALS — BP 108/67 | HR 60 | Temp 97.4°F | Ht 65.0 in | Wt 203.4 lb

## 2015-08-31 DIAGNOSIS — R188 Other ascites: Secondary | ICD-10-CM | POA: Diagnosis not present

## 2015-08-31 DIAGNOSIS — L309 Dermatitis, unspecified: Secondary | ICD-10-CM | POA: Insufficient documentation

## 2015-08-31 DIAGNOSIS — R300 Dysuria: Secondary | ICD-10-CM

## 2015-08-31 DIAGNOSIS — G309 Alzheimer's disease, unspecified: Secondary | ICD-10-CM

## 2015-08-31 DIAGNOSIS — F0280 Dementia in other diseases classified elsewhere without behavioral disturbance: Secondary | ICD-10-CM

## 2015-08-31 LAB — URINALYSIS, COMPLETE
BILIRUBIN UA: NEGATIVE
Glucose, UA: NEGATIVE
KETONES UA: NEGATIVE
NITRITE UA: NEGATIVE
PH UA: 7 (ref 5.0–7.5)
Protein, UA: NEGATIVE
RBC UA: NEGATIVE
SPEC GRAV UA: 1.015 (ref 1.005–1.030)
UUROB: 0.2 mg/dL (ref 0.2–1.0)

## 2015-08-31 LAB — MICROSCOPIC EXAMINATION
BACTERIA UA: NONE SEEN
EPITHELIAL CELLS (NON RENAL): NONE SEEN /HPF (ref 0–10)

## 2015-08-31 MED ORDER — TRIAMCINOLONE ACETONIDE 0.1 % EX CREA: 1.0000 "application " | TOPICAL_CREAM | Freq: Two times a day (BID) | CUTANEOUS | Status: DC

## 2015-08-31 MED ORDER — FUROSEMIDE 40 MG PO TABS: 80.0000 mg | ORAL_TABLET | Freq: Every day | ORAL | Status: DC

## 2015-08-31 NOTE — Progress Notes (Signed)
Subjective:  Patient ID: [name], male    DOB: 03-21-1925  Age: 82 y.o. MRN: [id number]  CC: abnormal lesion and Dysuria   HPI XZAVIAN SEMMEL presents for lesion on abd getting larger. Itches. Present a few weeks. Also not drinking much. Abd swollen. Minimally communicative. Hx given by wife and daughter.   History Dara has a past medical history of Diabetes mellitus; Hypertension; History of pneumonia; Hemorrhoids; MI (myocardial infarction) ([place]) (1996); GERD (gastroesophageal reflux disease); Hypothyroidism; Diverticulosis; Aortic insufficiency; Aortic valve sclerosis; Dyslipidemia; Hard of hearing; Small bowel obstruction ([place]); and Bronchitis.   He has past surgical history that includes Exploratory laparotomy w/ bowel resection; Transurethral resection of prostate; Cholecystectomy; Colonoscopy (2008); Abdominal hernia repair; and Cataract extraction.   His family history is negative for Colon cancer.He reports that he quit smoking about 73 years ago. He has never used smokeless tobacco. He reports that he does not drink alcohol or use illicit drugs.    ROS Review of Systems  Unable to perform ROS: Dementia    Objective:  BP 108/67 mmHg  Pulse 60  Temp(Src) 97.4 F (36.3 C) (Oral)  Ht 5' 5"  (1.651 m)  Wt 203 lb 6.4 oz (92.262 kg)  BMI 33.85 kg/m2  SpO2 98%  BP Readings from Last 3 Encounters:  08/31/15 108/67  08/10/15 105/63  08/08/15 141/61    Wt Readings from Last 3 Encounters:  08/31/15 203 lb 6.4 oz (92.262 kg)  08/10/15 206 lb (93.441 kg)  08/08/15 204 lb (92.534 kg)     Physical Exam  Constitutional: He is oriented to person, place, and time. He appears well-developed and well-nourished. No distress.  HENT:  Head: Normocephalic and atraumatic.  Right Ear: External ear normal.  Left Ear: External ear normal.  Nose: Nose normal.  Mouth/Throat: Oropharynx is clear and moist.  Eyes: Conjunctivae and EOM are normal. Pupils are equal, round, and  reactive to light.  Neck: Normal range of motion. Neck supple. No thyromegaly present.  Cardiovascular: Normal rate, regular rhythm and normal heart sounds.   No murmur heard. Pulmonary/Chest: Effort normal and breath sounds normal. No respiratory distress. He has no wheezes. He has no rales.  Abdominal: Soft. Bowel sounds are normal. Distention: incomplete Caput + fluid wave with shifting dullness. There is no tenderness. There is no rebound and no guarding.  Lymphadenopathy:    He has no cervical adenopathy.  Neurological: He is alert and oriented to person, place, and time. He has normal reflexes.  Skin: Skin is warm and dry. Rash (1 cm light brown flat lesion on RL abd. irregular, roughly ovoid. No redness of border. Not variegated. No bluish tint. Minimal scale.) noted.  Psychiatric: He has a normal mood and affect. His behavior is normal. Judgment and thought content normal.     Lab Results  Component Value Date   WBC 6.7 08/10/2015   HGB 14.5 08/08/2015   HCT 39.3 08/10/2015   PLT 169 08/10/2015   GLUCOSE 114* 08/10/2015   CHOL 115 07/05/2015   TRIG 249* 07/05/2015   HDL 29* 07/05/2015   LDLCALC 36 07/05/2015   ALT 18 08/10/2015   AST 19 08/10/2015   NA 138 08/10/2015   K 3.7 08/10/2015   CL 98 08/10/2015   CREATININE 1.32* 08/10/2015   BUN 19 08/10/2015   CO2 25 08/10/2015   TSH 1.600 07/05/2015   INR 1.7* 06/25/2007   HGBA1C 6.8 07/05/2015    Dg Chest Portable 1 View  08/08/2015  CLINICAL  DATA:  Altered mental status. EXAM: PORTABLE CHEST 1 VIEW COMPARISON:  06/15/2014 FINDINGS: 1454 hours. Lung volumes are low The cardio pericardial silhouette is enlarged. Interstitial markings are diffusely coarsened with chronic features. No focal airspace consolidation or pulmonary edema. The visualized bony structures of the thorax are intact. Telemetry leads overlie the chest. IMPRESSION: Cardiomegaly with low lung volumes and underlying chronic interstitial coarsening.  Electronically Signed   By: Misty Stanley M.D.   On: 08/08/2015 15:06    Assessment & Plan:   Faheem was seen today for abnormal lesion and dysuria.  Diagnoses and all orders for this visit:  Alzheimer's dementia -     Urinalysis, Complete -     CMP14+EGFR  Eczema -     Urinalysis, Complete -     CMP14+EGFR  Ascites -     Urinalysis, Complete -     CMP14+EGFR  Dysuria -     Urinalysis, Complete -     CMP14+EGFR  Other orders -     triamcinolone cream (KENALOG) 0.1 %; Apply 1 application topically 2 (two) times daily. -     furosemide (LASIX) 40 MG tablet; Take 2 tablets (80 mg total) by mouth daily.   I have discontinued Mr. Ensminger donepezil. I have also changed his furosemide. Additionally, I am having him start on triamcinolone cream. Lastly, I am having him maintain his aspirin, Fish Oil, NATURAL SENNA LAXATIVE, albuterol, blood glucose meter kit and supplies, glucose blood, clobetasol ointment, traMADol, blood glucose meter kit and supplies, hydrOXYzine, amLODipine-benazepril, omeprazole, gabapentin, metoprolol succinate, simvastatin, JANUVIA, levothyroxine, ketoconazole, mupirocin ointment, docusate sodium, and cholecalciferol.  Meds ordered this encounter  Medications  . triamcinolone cream (KENALOG) 0.1 %    Sig: Apply 1 application topically 2 (two) times daily.    Dispense:  30 g    Refill:  0  . furosemide (LASIX) 40 MG tablet    Sig: Take 2 tablets (80 mg total) by mouth daily.    Dispense:  60 tablet    Refill:  5     Follow-up: Return in about 1 month (around 09/30/2015), or if symptoms worsen or fail to improve.  Claretta Fraise, M.D.

## 2015-09-01 LAB — CMP14+EGFR
A/G RATIO: 1.2 (ref 1.2–2.2)
ALBUMIN: 3.7 g/dL (ref 3.5–4.7)
ALT: 18 IU/L (ref 0–44)
AST: 18 IU/L (ref 0–40)
Alkaline Phosphatase: 71 IU/L (ref 39–117)
BUN / CREAT RATIO: 15 (ref 10–24)
BUN: 18 mg/dL (ref 8–27)
Bilirubin Total: 0.3 mg/dL (ref 0.0–1.2)
CALCIUM: 9.1 mg/dL (ref 8.6–10.2)
CO2: 25 mmol/L (ref 18–29)
CREATININE: 1.23 mg/dL (ref 0.76–1.27)
Chloride: 97 mmol/L (ref 96–106)
GFR, EST AFRICAN AMERICAN: 60 mL/min/{1.73_m2} (ref >59)
GFR, EST NON AFRICAN AMERICAN: 52 mL/min/{1.73_m2} — AB (ref >59)
GLOBULIN, TOTAL: 3 g/dL (ref 1.5–4.5)
Glucose: 140 mg/dL — ABNORMAL HIGH (ref 65–99)
Potassium: 4.3 mmol/L (ref 3.5–5.2)
SODIUM: 138 mmol/L (ref 134–144)
Total Protein: 6.7 g/dL (ref 6.0–8.5)

## 2015-09-13 ENCOUNTER — Telehealth: Payer: Self-pay | Admitting: Family Medicine

## 2015-09-13 NOTE — Telephone Encounter (Signed)
Labs essentially normal/stable - minimal weakening of kidneys is stable andblood glucose is good considering he is diabetic.

## 2015-09-13 NOTE — Telephone Encounter (Signed)
Pts family is aware

## 2015-09-13 NOTE — Telephone Encounter (Signed)
Patients daughter is calling requesting lab results from 4/4 please advise.

## 2015-10-04 ENCOUNTER — Other Ambulatory Visit: Payer: Self-pay | Admitting: Family Medicine

## 2015-10-05 ENCOUNTER — Encounter: Payer: Self-pay | Admitting: Family Medicine

## 2015-10-05 ENCOUNTER — Encounter: Payer: Self-pay | Admitting: *Deleted

## 2015-10-05 ENCOUNTER — Encounter (INDEPENDENT_AMBULATORY_CARE_PROVIDER_SITE_OTHER): Payer: Self-pay

## 2015-10-05 ENCOUNTER — Ambulatory Visit (INDEPENDENT_AMBULATORY_CARE_PROVIDER_SITE_OTHER): Payer: Commercial Managed Care - HMO | Admitting: Family Medicine

## 2015-10-05 VITALS — Ht 65.0 in | Wt 202.6 lb

## 2015-10-05 DIAGNOSIS — G309 Alzheimer's disease, unspecified: Secondary | ICD-10-CM

## 2015-10-05 DIAGNOSIS — I10 Essential (primary) hypertension: Secondary | ICD-10-CM

## 2015-10-05 DIAGNOSIS — D509 Iron deficiency anemia, unspecified: Secondary | ICD-10-CM | POA: Diagnosis not present

## 2015-10-05 DIAGNOSIS — F0280 Dementia in other diseases classified elsewhere without behavioral disturbance: Secondary | ICD-10-CM

## 2015-10-05 DIAGNOSIS — E1142 Type 2 diabetes mellitus with diabetic polyneuropathy: Secondary | ICD-10-CM | POA: Diagnosis not present

## 2015-10-05 DIAGNOSIS — E559 Vitamin D deficiency, unspecified: Secondary | ICD-10-CM | POA: Diagnosis not present

## 2015-10-05 DIAGNOSIS — E039 Hypothyroidism, unspecified: Secondary | ICD-10-CM | POA: Diagnosis not present

## 2015-10-05 LAB — BAYER DCA HB A1C WAIVED: HB A1C: 6.8 % (ref <7.0)

## 2015-10-05 NOTE — Progress Notes (Signed)
Subjective:  Patient ID: [name], male    DOB: 03-26-1925  Age: 82 y.o. MRN: [id number]  CC: Hyperlipidemia; Diabetes; and Hypertension   HPI BRAVERY KETCHAM presents for dementia pills made him worse. Caused nausea & behavioral disturbance. Not having to take anxiety med. Forgetting more so doesn't Demand that he be given his hydroxyzine  Patient in for follow-up of elevated cholesterol. Doing well without complaints on current medication. Denies side effects of statin including myalgia and arthralgia and nausea. Also in today for liver function testing. Currently no chest pain, shortness of breath or other cardiovascular related symptoms noted.   follow-up of hypertension. Patient has no history of headache chest pain or shortness of breath or recent cough. Patient also denies symptoms of TIA such as numbness weakness lateralizing. Patient checks  blood pressure at home and has not had any elevated readings recently. Patient denies side effects from his medication. States taking it regularly.  Patient taking diabetes medicines without difficulty or complaint. Blood sugar not being checked regularly. Patient is incompetent to check his own.  Patient presents for follow-up on  thyroid. The patient has a history of hypothyroidism for many years. It has been stable recently. Pt. denies any change in  voice, loss of hair, heat or cold intolerance. Energy level has been adequate to good. Patient denies constipation and diarrhea. No myxedema. Medication is as noted below. Verified that pt is taking it daily on an empty stomach. Well tolerated.    History Tarry has a past medical history of Diabetes mellitus; Hypertension; History of pneumonia; Hemorrhoids; MI (myocardial infarction) ([place]) (1996); GERD (gastroesophageal reflux disease); Hypothyroidism; Diverticulosis; Aortic insufficiency; Aortic valve sclerosis; Dyslipidemia; Hard of hearing; Small bowel obstruction ([place]); and Bronchitis.    He has past surgical history that includes Exploratory laparotomy w/ bowel resection; Transurethral resection of prostate; Cholecystectomy; Colonoscopy (2008); Abdominal hernia repair; and Cataract extraction.   His family history is negative for Colon cancer.He reports that he quit smoking about 73 years ago. He has never used smokeless tobacco. He reports that he does not drink alcohol or use illicit drugs.    ROS Review of Systems  Constitutional: Negative for fever, chills and diaphoresis.  HENT: Negative for rhinorrhea and sore throat.   Respiratory: Negative for cough and shortness of breath.   Cardiovascular: Negative for chest pain.  Gastrointestinal: Negative for abdominal pain.  Musculoskeletal: Negative for myalgias and arthralgias.  Skin: Negative for rash.  Neurological: Negative for weakness and headaches.  Psychiatric/Behavioral: Positive for confusion. Negative for dysphoric mood. The patient is not nervous/anxious.     Objective:  Ht 5' 5"  (1.651 m)  Wt 202 lb 9.6 oz (91.899 kg)  BMI 33.71 kg/m2  BP Readings from Last 3 Encounters:  08/31/15 108/67  08/10/15 105/63  08/08/15 141/61    Wt Readings from Last 3 Encounters:  10/05/15 202 lb 9.6 oz (91.899 kg)  08/31/15 203 lb 6.4 oz (92.262 kg)  08/10/15 206 lb (93.441 kg)     Physical Exam  Constitutional: He is oriented to person, place, and time. He appears well-developed and well-nourished. No distress.  HENT:  Head: Normocephalic and atraumatic.  Right Ear: External ear normal.  Left Ear: External ear normal.  Nose: Nose normal.  Mouth/Throat: Oropharynx is clear and moist.  Eyes: Conjunctivae and EOM are normal. Pupils are equal, round, and reactive to light.  Neck: Normal range of motion. Neck supple. No thyromegaly present.  Cardiovascular: Normal rate, regular rhythm and normal  heart sounds.   No murmur heard. Pulmonary/Chest: Effort normal and breath sounds normal. No respiratory distress.  He has no wheezes. He has no rales.  Abdominal: Soft. Bowel sounds are normal. He exhibits no distension. There is no tenderness.  Lymphadenopathy:    He has no cervical adenopathy.  Neurological: He is alert and oriented to person, place, and time. He has normal reflexes.  Skin: Skin is warm and dry.  Psychiatric: His affect is blunt. His speech is delayed and slurred. He is slowed and withdrawn. Cognition and memory are impaired. He is inattentive.     Lab Results  Component Value Date   WBC 6.7 08/10/2015   HGB 14.5 08/08/2015   HCT 39.3 08/10/2015   PLT 169 08/10/2015   GLUCOSE 140* 08/31/2015   CHOL 115 07/05/2015   TRIG 249* 07/05/2015   HDL 29* 07/05/2015   LDLCALC 36 07/05/2015   ALT 18 08/31/2015   AST 18 08/31/2015   NA 138 08/31/2015   K 4.3 08/31/2015   CL 97 08/31/2015   CREATININE 1.23 08/31/2015   BUN 18 08/31/2015   CO2 25 08/31/2015   TSH 1.600 07/05/2015   INR 1.7* 06/25/2007   HGBA1C 6.8 07/05/2015    Dg Chest Portable 1 View  08/08/2015  CLINICAL DATA:  Altered mental status. EXAM: PORTABLE CHEST 1 VIEW COMPARISON:  06/15/2014 FINDINGS: 1454 hours. Lung volumes are low The cardio pericardial silhouette is enlarged. Interstitial markings are diffusely coarsened with chronic features. No focal airspace consolidation or pulmonary edema. The visualized bony structures of the thorax are intact. Telemetry leads overlie the chest. IMPRESSION: Cardiomegaly with low lung volumes and underlying chronic interstitial coarsening. Electronically Signed   By: Misty Stanley M.D.   On: 08/08/2015 15:06    Assessment & Plan:   Yue was seen today for hyperlipidemia, diabetes and hypertension.  Diagnoses and all orders for this visit:  Alzheimer's dementia -     CMP14+EGFR  Essential hypertension -     CMP14+EGFR  Diabetic polyneuropathy associated with type 2 diabetes mellitus ([place]) -     CMP14+EGFR -     Microalbumin / creatinine urine ratio -     Bayer DCA Hb  A1c Waived  Hypothyroidism, unspecified hypothyroidism type -     CMP14+EGFR -     TSH + free T4  Iron deficiency anemia, unspecified -     CMP14+EGFR -     CBC with Differential/Platelet  Vitamin D deficiency -     CMP14+EGFR    I am having Mr. Longmore maintain his aspirin, Fish Oil, NATURAL SENNA LAXATIVE, albuterol, blood glucose meter kit and supplies, glucose blood, clobetasol ointment, traMADol, blood glucose meter kit and supplies, hydrOXYzine, amLODipine-benazepril, omeprazole, metoprolol succinate, simvastatin, levothyroxine, ketoconazole, mupirocin ointment, docusate sodium, cholecalciferol, triamcinolone cream, furosemide, JANUVIA, and gabapentin.  No orders of the defined types were placed in this encounter.     Follow-up: Return in about 3 months (around 01/05/2016).  Claretta Fraise, M.D.

## 2015-10-06 ENCOUNTER — Other Ambulatory Visit: Payer: Commercial Managed Care - HMO

## 2015-10-06 ENCOUNTER — Other Ambulatory Visit: Payer: Self-pay | Admitting: Family Medicine

## 2015-10-06 DIAGNOSIS — E1142 Type 2 diabetes mellitus with diabetic polyneuropathy: Secondary | ICD-10-CM | POA: Diagnosis not present

## 2015-10-06 LAB — CMP14+EGFR
ALBUMIN: 3.5 g/dL (ref 3.5–4.7)
ALK PHOS: 72 IU/L (ref 39–117)
ALT: 13 IU/L (ref 0–44)
AST: 16 IU/L (ref 0–40)
Albumin/Globulin Ratio: 1.1 — ABNORMAL LOW (ref 1.2–2.2)
BILIRUBIN TOTAL: 0.4 mg/dL (ref 0.0–1.2)
BUN / CREAT RATIO: 11 (ref 10–24)
BUN: 15 mg/dL (ref 8–27)
CHLORIDE: 97 mmol/L (ref 96–106)
CO2: 25 mmol/L (ref 18–29)
Calcium: 9 mg/dL (ref 8.6–10.2)
Creatinine, Ser: 1.34 mg/dL — ABNORMAL HIGH (ref 0.76–1.27)
GFR calc Af Amer: 54 mL/min/{1.73_m2} — ABNORMAL LOW (ref >59)
GFR calc non Af Amer: 47 mL/min/{1.73_m2} — ABNORMAL LOW (ref >59)
GLUCOSE: 116 mg/dL — AB (ref 65–99)
Globulin, Total: 3.2 g/dL (ref 1.5–4.5)
Potassium: 3.4 mmol/L — ABNORMAL LOW (ref 3.5–5.2)
SODIUM: 141 mmol/L (ref 134–144)
Total Protein: 6.7 g/dL (ref 6.0–8.5)

## 2015-10-06 LAB — CBC WITH DIFFERENTIAL/PLATELET
BASOS ABS: 0.1 10*3/uL (ref 0.0–0.2)
Basos: 1 %
EOS (ABSOLUTE): 0.2 10*3/uL (ref 0.0–0.4)
EOS: 2 %
Hematocrit: 39.6 % (ref 37.5–51.0)
Hemoglobin: 13.3 g/dL (ref 12.6–17.7)
Immature Grans (Abs): 0 10*3/uL (ref 0.0–0.1)
Immature Granulocytes: 0 %
LYMPHS ABS: 3 10*3/uL (ref 0.7–3.1)
Lymphs: 31 %
MCH: 29.2 pg (ref 26.6–33.0)
MCHC: 33.6 g/dL (ref 31.5–35.7)
MCV: 87 fL (ref 79–97)
MONOS ABS: 0.9 10*3/uL (ref 0.1–0.9)
Monocytes: 9 %
NEUTROS ABS: 5.5 10*3/uL (ref 1.4–7.0)
Neutrophils: 57 %
PLATELETS: 187 10*3/uL (ref 150–379)
RBC: 4.56 x10E6/uL (ref 4.14–5.80)
RDW: 14.2 % (ref 12.3–15.4)
WBC: 9.6 10*3/uL (ref 3.4–10.8)

## 2015-10-06 LAB — TSH+FREE T4
Free T4: 1.25 ng/dL (ref 0.82–1.77)
TSH: 2.54 u[IU]/mL (ref 0.450–4.500)

## 2015-10-06 MED ORDER — POTASSIUM CHLORIDE CRYS ER 20 MEQ PO TBCR: 20.0000 meq | EXTENDED_RELEASE_TABLET | Freq: Every day | ORAL | Status: DC

## 2015-10-07 LAB — MICROALBUMIN / CREATININE URINE RATIO
Creatinine, Urine: 104.3 mg/dL
MICROALB/CREAT RATIO: 4.4 mg/g creat (ref 0.0–30.0)
MICROALBUM., U, RANDOM: 4.6 ug/mL

## 2015-10-12 ENCOUNTER — Telehealth: Payer: Self-pay | Admitting: *Deleted

## 2015-10-12 MED ORDER — POTASSIUM CHLORIDE 20 MEQ/15ML (10%) PO SOLN: 20.0000 meq | Freq: Every day | ORAL | Status: DC

## 2015-10-12 NOTE — Telephone Encounter (Signed)
Per pt's daughter pt can not swallow potassium pill RX changed to liquid Okayed per Dr Achille Rich called into [facility]

## 2015-10-17 ENCOUNTER — Encounter (HOSPITAL_COMMUNITY): Payer: Self-pay | Admitting: Emergency Medicine

## 2015-10-17 ENCOUNTER — Emergency Department (HOSPITAL_COMMUNITY): Payer: Commercial Managed Care - HMO

## 2015-10-17 ENCOUNTER — Emergency Department (HOSPITAL_COMMUNITY)
Admission: EM | Admit: 2015-10-17 | Discharge: 2015-10-17 | Disposition: A | Discharge status: Home / Self Care | Payer: Commercial Managed Care - HMO | Attending: Emergency Medicine | Admitting: Emergency Medicine

## 2015-10-17 DIAGNOSIS — E785 Hyperlipidemia, unspecified: Secondary | ICD-10-CM | POA: Diagnosis not present

## 2015-10-17 DIAGNOSIS — Y939 Activity, unspecified: Secondary | ICD-10-CM | POA: Insufficient documentation

## 2015-10-17 DIAGNOSIS — Y9289 Other specified places as the place of occurrence of the external cause: Secondary | ICD-10-CM | POA: Insufficient documentation

## 2015-10-17 DIAGNOSIS — I110 Hypertensive heart disease with heart failure: Secondary | ICD-10-CM | POA: Insufficient documentation

## 2015-10-17 DIAGNOSIS — Z7982 Long term (current) use of aspirin: Secondary | ICD-10-CM | POA: Insufficient documentation

## 2015-10-17 DIAGNOSIS — N289 Disorder of kidney and ureter, unspecified: Secondary | ICD-10-CM | POA: Insufficient documentation

## 2015-10-17 DIAGNOSIS — Z87891 Personal history of nicotine dependence: Secondary | ICD-10-CM | POA: Diagnosis not present

## 2015-10-17 DIAGNOSIS — S0181XA Laceration without foreign body of other part of head, initial encounter: Secondary | ICD-10-CM | POA: Diagnosis not present

## 2015-10-17 DIAGNOSIS — Z7984 Long term (current) use of oral hypoglycemic drugs: Secondary | ICD-10-CM | POA: Diagnosis not present

## 2015-10-17 DIAGNOSIS — I252 Old myocardial infarction: Secondary | ICD-10-CM | POA: Diagnosis not present

## 2015-10-17 DIAGNOSIS — Z79899 Other long term (current) drug therapy: Secondary | ICD-10-CM | POA: Diagnosis not present

## 2015-10-17 DIAGNOSIS — S0001XA Abrasion of scalp, initial encounter: Secondary | ICD-10-CM

## 2015-10-17 DIAGNOSIS — E119 Type 2 diabetes mellitus without complications: Secondary | ICD-10-CM | POA: Insufficient documentation

## 2015-10-17 DIAGNOSIS — I509 Heart failure, unspecified: Secondary | ICD-10-CM | POA: Diagnosis not present

## 2015-10-17 DIAGNOSIS — S098XXA Other specified injuries of head, initial encounter: Secondary | ICD-10-CM | POA: Diagnosis not present

## 2015-10-17 DIAGNOSIS — S199XXA Unspecified injury of neck, initial encounter: Secondary | ICD-10-CM | POA: Diagnosis not present

## 2015-10-17 DIAGNOSIS — Y92009 Unspecified place in unspecified non-institutional (private) residence as the place of occurrence of the external cause: Secondary | ICD-10-CM

## 2015-10-17 DIAGNOSIS — Y999 Unspecified external cause status: Secondary | ICD-10-CM | POA: Insufficient documentation

## 2015-10-17 DIAGNOSIS — E039 Hypothyroidism, unspecified: Secondary | ICD-10-CM | POA: Insufficient documentation

## 2015-10-17 DIAGNOSIS — R112 Nausea with vomiting, unspecified: Secondary | ICD-10-CM | POA: Diagnosis not present

## 2015-10-17 DIAGNOSIS — S61401A Unspecified open wound of right hand, initial encounter: Secondary | ICD-10-CM | POA: Diagnosis not present

## 2015-10-17 DIAGNOSIS — R402411 Glasgow coma scale score 13-15, in the field [EMT or ambulance]: Secondary | ICD-10-CM | POA: Diagnosis not present

## 2015-10-17 DIAGNOSIS — S0990XA Unspecified injury of head, initial encounter: Secondary | ICD-10-CM | POA: Diagnosis not present

## 2015-10-17 DIAGNOSIS — J9811 Atelectasis: Secondary | ICD-10-CM | POA: Diagnosis not present

## 2015-10-17 DIAGNOSIS — S3991XA Unspecified injury of abdomen, initial encounter: Secondary | ICD-10-CM | POA: Diagnosis not present

## 2015-10-17 DIAGNOSIS — W06XXXA Fall from bed, initial encounter: Secondary | ICD-10-CM | POA: Diagnosis not present

## 2015-10-17 DIAGNOSIS — S0180XA Unspecified open wound of other part of head, initial encounter: Secondary | ICD-10-CM | POA: Diagnosis not present

## 2015-10-17 DIAGNOSIS — S0093XA Contusion of unspecified part of head, initial encounter: Secondary | ICD-10-CM | POA: Diagnosis not present

## 2015-10-17 DIAGNOSIS — W19XXXA Unspecified fall, initial encounter: Secondary | ICD-10-CM

## 2015-10-17 DIAGNOSIS — I10 Essential (primary) hypertension: Secondary | ICD-10-CM | POA: Diagnosis not present

## 2015-10-17 DIAGNOSIS — S0003XA Contusion of scalp, initial encounter: Secondary | ICD-10-CM

## 2015-10-17 HISTORY — DX: Compression of brain: G93.5

## 2015-10-17 HISTORY — DX: Heart failure, unspecified: I50.9

## 2015-10-17 HISTORY — DX: Unspecified dementia, unspecified severity, without behavioral disturbance, psychotic disturbance, mood disturbance, and anxiety: F03.90

## 2015-10-17 LAB — COMPREHENSIVE METABOLIC PANEL
ALBUMIN: 3.4 g/dL — AB (ref 3.5–5.0)
ALK PHOS: 62 U/L (ref 38–126)
ALT: 16 U/L — AB (ref 17–63)
AST: 19 U/L (ref 15–41)
Anion gap: 8 (ref 5–15)
BUN: 19 mg/dL (ref 6–20)
CALCIUM: 8.5 mg/dL — AB (ref 8.9–10.3)
CHLORIDE: 102 mmol/L (ref 101–111)
CO2: 25 mmol/L (ref 22–32)
CREATININE: 1.45 mg/dL — AB (ref 0.61–1.24)
GFR calc non Af Amer: 41 mL/min — ABNORMAL LOW (ref >60)
GFR, EST AFRICAN AMERICAN: 48 mL/min — AB (ref >60)
GLUCOSE: 166 mg/dL — AB (ref 65–99)
Potassium: 3.4 mmol/L — ABNORMAL LOW (ref 3.5–5.1)
SODIUM: 135 mmol/L (ref 135–145)
Total Bilirubin: 0.5 mg/dL (ref 0.3–1.2)
Total Protein: 6.5 g/dL (ref 6.5–8.1)

## 2015-10-17 LAB — CBC WITH DIFFERENTIAL/PLATELET
BASOS PCT: 1 %
Basophils Absolute: 0.1 10*3/uL (ref 0.0–0.1)
Eosinophils Absolute: 0.2 10*3/uL (ref 0.0–0.7)
Eosinophils Relative: 2 %
HEMATOCRIT: 38.9 % — AB (ref 39.0–52.0)
HEMOGLOBIN: 13.1 g/dL (ref 13.0–17.0)
LYMPHS ABS: 2.7 10*3/uL (ref 0.7–4.0)
Lymphocytes Relative: 34 %
MCH: 29.3 pg (ref 26.0–34.0)
MCHC: 33.7 g/dL (ref 30.0–36.0)
MCV: 87 fL (ref 78.0–100.0)
MONOS PCT: 6 %
Monocytes Absolute: 0.5 10*3/uL (ref 0.1–1.0)
NEUTROS ABS: 4.4 10*3/uL (ref 1.7–7.7)
NEUTROS PCT: 57 %
Platelets: 177 10*3/uL (ref 150–400)
RBC: 4.47 MIL/uL (ref 4.22–5.81)
RDW: 13.4 % (ref 11.5–15.5)
WBC: 7.7 10*3/uL (ref 4.0–10.5)

## 2015-10-17 LAB — LIPASE, BLOOD: Lipase: 24 U/L (ref 11–51)

## 2015-10-17 LAB — URINALYSIS, ROUTINE W REFLEX MICROSCOPIC
Bilirubin Urine: NEGATIVE
Glucose, UA: NEGATIVE mg/dL
HGB URINE DIPSTICK: NEGATIVE
Ketones, ur: NEGATIVE mg/dL
LEUKOCYTES UA: NEGATIVE
Nitrite: NEGATIVE
Protein, ur: NEGATIVE mg/dL
pH: 5.5 (ref 5.0–8.0)

## 2015-10-17 LAB — TROPONIN I: Troponin I: <0.03 ng/mL (ref <0.031)

## 2015-10-17 MED ORDER — ONDANSETRON HCL 8 MG PO TABS: 8.0000 mg | ORAL_TABLET | Freq: Three times a day (TID) | ORAL | Status: DC | PRN

## 2015-10-17 MED ORDER — SODIUM CHLORIDE 0.9 % IV SOLN
INTRAVENOUS | Status: DC
  Administered 2015-10-17: 06:00:00 via INTRAVENOUS

## 2015-10-17 MED ORDER — METOCLOPRAMIDE HCL 5 MG/ML IJ SOLN
5.0000 mg | Freq: Once | INTRAMUSCULAR | Status: AC
  Administered 2015-10-17: 5 mg via INTRAVENOUS
  Filled 2015-10-17: qty 2

## 2015-10-17 NOTE — ED Notes (Signed)
Pt returned from CT at this time, CT techs stated pt V/ X5. MD Thurnell Garbe notified.

## 2015-10-17 NOTE — ED Notes (Signed)
Pt had one episode of emesis, MD Thurnell Garbe notified and at bedside.

## 2015-10-17 NOTE — ED Notes (Signed)
Per EMS pt fell from a seated position in bed, hit the wall. Pt has hematoma to forehead and laceration to top of head. Bleeding is controlled, dressed by EMS. Per family pt is at baseline, hx of dementia.

## 2015-10-17 NOTE — ED Notes (Signed)
Patient transported to CT 

## 2015-10-17 NOTE — ED Notes (Signed)
Pt's O2 saturation fell to 90%, pt placed on 2L [place].

## 2015-10-17 NOTE — ED Provider Notes (Signed)
09: 30- reevaluation after return of imaging.  He initially presented for evaluation of fall. He injured his hand. He has had vomiting after arrival to the ED. For this, a CT scan was ordered. He was treated with IV fluids, and anti-emetics.   At this time- the patient is somewhat more comfortable. He is very hard of hearing which makes communication difficult. He also apparently has dementia. He does not use oxygen at home. He is currently on nasal cannula oxygen, with a normal saturation. It was discontinued to see if he has an oxygen requirement. Heart regular rate and rhythm without murmur. Lungs clear anteriorly. Abdomen with hypoactive bowel sounds. The abdomen is protuberant, and he has a known chronic ventral hernia. Is mild diffuse abdominal tenderness. The tenderness is very mild. There is no rebound tenderness or guarding.  ED course- he has been evaluated for fall, without serious injury. Neck and is not fall is not clear, he was sitting on the bed when he fell forward striking the floor. He apparently did not get up after the fall. He has had falls in the past, and requires a walker to ambulate. CT scan was done without contrast because of renal insufficiency. It did not show any acute obstructive process or evidence for acute intra-abdominal processes. There was note made of possible hepatic abnormality; with limited evaluation because of the noncontrast CT. , There was mention made of possible atelectasis versus bilateral lung infection. The patient does not currently have a respiratory illness, clinically. The liver abnormality will require evaluation as an outpatient, non-emergently, with abdominal MRI, should that be indicated. Note that the patient is chronically debilitated, elderly, and aggressive evaluation is not necessarily indicated.  At this time, will continue. ED evaluation with trial of oral fluids, and ambulation using his walker. He will then be reassessed.  Results for  orders placed or performed during the hospital encounter of 10/17/15  CBC with Differential  Result Value Ref Range   WBC 7.7 4.0 - 10.5 K/uL   RBC 4.47 4.22 - 5.81 MIL/uL   Hemoglobin 13.1 13.0 - 17.0 g/dL   HCT 38.9 (L) 39.0 - 52.0 %   MCV 87.0 78.0 - 100.0 fL   MCH 29.3 26.0 - 34.0 pg   MCHC 33.7 30.0 - 36.0 g/dL   RDW 13.4 11.5 - 15.5 %   Platelets 177 150 - 400 K/uL   Neutrophils Relative % 57 %   Neutro Abs 4.4 1.7 - 7.7 K/uL   Lymphocytes Relative 34 %   Lymphs Abs 2.7 0.7 - 4.0 K/uL   Monocytes Relative 6 %   Monocytes Absolute 0.5 0.1 - 1.0 K/uL   Eosinophils Relative 2 %   Eosinophils Absolute 0.2 0.0 - 0.7 K/uL   Basophils Relative 1 %   Basophils Absolute 0.1 0.0 - 0.1 K/uL  Troponin I  Result Value Ref Range   Troponin I <0.03 <0.031 ng/mL  Urinalysis, Routine w reflex microscopic  Result Value Ref Range   Color, Urine YELLOW YELLOW   APPearance CLEAR CLEAR   Specific Gravity, Urine <1.005 (L) 1.005 - 1.030   pH 5.5 5.0 - 8.0   Glucose, UA NEGATIVE NEGATIVE mg/dL   Hgb urine dipstick NEGATIVE NEGATIVE   Bilirubin Urine NEGATIVE NEGATIVE   Ketones, ur NEGATIVE NEGATIVE mg/dL   Protein, ur NEGATIVE NEGATIVE mg/dL   Nitrite NEGATIVE NEGATIVE   Leukocytes, UA NEGATIVE NEGATIVE  Comprehensive metabolic panel  Result Value Ref Range   Sodium 135 135 -  145 mmol/L   Potassium 3.4 (L) 3.5 - 5.1 mmol/L   Chloride 102 101 - 111 mmol/L   CO2 25 22 - 32 mmol/L   Glucose, Bld 166 (H) 65 - 99 mg/dL   BUN 19 6 - 20 mg/dL   Creatinine, Ser 1.45 (H) 0.61 - 1.24 mg/dL   Calcium 8.5 (L) 8.9 - 10.3 mg/dL   Total Protein 6.5 6.5 - 8.1 g/dL   Albumin 3.4 (L) 3.5 - 5.0 g/dL   AST 19 15 - 41 U/L   ALT 16 (L) 17 - 63 U/L   Alkaline Phosphatase 62 38 - 126 U/L   Total Bilirubin 0.5 0.3 - 1.2 mg/dL   GFR calc non Af Amer 41 (L) >60 mL/min   GFR calc Af Amer 48 (L) >60 mL/min   Anion gap 8 5 - 15  Lipase, blood  Result Value Ref Range   Lipase 24 11 - 51 U/L    Ct  Abdomen Pelvis Wo Contrast  10/17/2015  CLINICAL DATA:  Post fall hitting head, now with vomiting. EXAM: CT ABDOMEN AND PELVIS WITHOUT CONTRAST TECHNIQUE: Multidetector CT imaging of the abdomen and pelvis was performed following the standard protocol without IV contrast. COMPARISON:  CT abdomen pelvis- 08/11/2010 FINDINGS: Lower chest: Limited visualization of the lower thorax demonstrates consolidative opacities within the imaged left lower lobe and caudal aspect of the lingula with air bronchograms (representative images 7, 10 and 11, series 10). Minimal dependent subpleural ground-glass atelectasis. Basilar reticular opacities without evidence of honeycombing. Normal heart size. Coronary artery calcifications. No pericardial effusion. Note is made of mediastinal lipomatosis. Hepatobiliary: Normal hepatic contour. There is an approximately 1.7 x 1.6 cm ill-defined hypo attenuating lesion within the central aspect of the medial segment of the left lobe of the liver (image 26, series 2) as well as a ill-defined lesion within the subcapsular aspect of the lateral segment of the left lobe of the liver measuring approximately 1.2 cm (image 26), neither of which were definitively seen on prior noncontrast examination is an are incompletely characterized on this noncontrast examination. Post cholecystectomy.  No ascites. Pancreas: Normal noncontrast appearance of the pancreas Spleen: Normal noncontrast appearance of the spleen. Note is made of several tiny splenules. Adrenals/Urinary Tract: The bilateral kidneys appear atrophic. No renal stones. No renal stones are seen along expected course of either ureter or the urinary bladder. The urinary bladder appears slightly patulous though otherwise unremarkable given degree distention. There is a minimal amount of likely age and body habitus related perinephric stranding. No urinary obstruction. There is mild thickening of the bilateral adrenal glands without discrete  nodule. Stomach/Bowel: Multiple loops of nondilated large and small bowel are again noted to be contained within a wide-mouth mid/lower ventral wall hernia, similar to remote examination performed 07/2010. Ingested enteric contrast extends to the level of the proximal sigmoid colon. Large colonic stool burden within the rectal vault and distal sigmoid colon without evidence of enteric obstruction. Scattered colonic diverticulosis without evidence of diverticulitis. Normal appearance of the terminal ileum and appendix. No pneumoperitoneum, pneumatosis or portal venous gas. Vascular/Lymphatic: Scattered eccentric calcified plaque within a normal caliber abdominal aorta. Slightly misty appearance of the midline of the abdominal mesenteries unchanged since the 07/2010 examination and likely secondary to body habitus. No bulky retroperitoneal, mesenteric, pelvic or inguinal lymphadenopathy on this noncontrast examination. Reproductive: Dystrophic calcifications within normal sized prostate gland. No free fluid in the pelvic cul-de-sac. Other: Small bilateral mesenteric fat containing inguinal hernias. Musculoskeletal: Interval development  of a moderate (approximately 40%) focal concavity involving the superior endplate of the L5 vertebral body without associated definitive acute fracture line or retropulsion though new compared to the 07/2010 examination. Unchanged mild (under 25%) compression deformity involving the superior endplate of the L4 vertebral body. IMPRESSION: 1. No explanation for patient's vomiting. Specifically, no evidence of enteric or urinary obstruction. 2. Moderate (approximately 40%) focal concavity involving the superior endplate of the L5 vertebral body without associated fracture line or retropulsion - age indeterminate though new since the 07/2010 examination. Correlation for point tenderness at this location is recommended. 3. Unchanged large wide mouth ventral abdominal wall hernia not  resulting in enteric obstruction. 4. Colonic diverticulosis without evidence of diverticulitis. 5. Airspace opacities within the left lower lobe and lingula, potentially atelectasis though developing infection not excluded. Clinical correlation is advised. 6. Potential development of ill-defined hypo attenuating hepatic lesions, incompletely characterized on this noncontrast examination though not definitely seen on prior examinations - further evaluation with nonemergent contrast-enhanced abdominal MRI is recommended for further characterization. Electronically Signed   By: Sandi Mariscal M.D.   On: 10/17/2015 08:52   Dg Chest 1 View  10/17/2015  CLINICAL DATA:  Altered mental status. Fell prior to arrival. Hematoma on the forehead. EXAM: CHEST 1 VIEW COMPARISON:  08/08/2015 FINDINGS: Cardiac enlargement. No pulmonary vascular congestion. Atelectasis in the left lung base. Blunting of the left costophrenic angle may be due to fluid or thickened pleura. Probable old left rib fractures. Calcified and tortuous aorta. Right lung is clear. Degenerative changes in the spine. IMPRESSION: Atelectasis with fluid or thickened pleura in the left base. Cardiac enlargement without vascular congestion or edema. Electronically Signed   By: Lucienne Capers M.D.   On: 10/17/2015 05:21   Ct Head Wo Contrast  10/17/2015  CLINICAL DATA:  Fall. Hematoma to the forehead and laceration to the top of the head. History of dementia. EXAM: CT HEAD WITHOUT CONTRAST CT CERVICAL SPINE WITHOUT CONTRAST TECHNIQUE: Multidetector CT imaging of the head and cervical spine was performed following the standard protocol without intravenous contrast. Multiplanar CT image reconstructions of the cervical spine were also generated. COMPARISON:  CT head 03/22/2011 FINDINGS: CT HEAD FINDINGS Left frontal subcutaneous scalp hematoma. Diffuse cerebral atrophy. Ventricular dilatation consistent with central atrophy. No mass effect or midline shift. No  abnormal extra-axial fluid collections. Gray-white matter junctions are distinct. Basal cisterns are not effaced. No evidence of acute intracranial hemorrhage. No depressed skull fractures. Visualized paranasal sinuses and mastoid air cells are not opacified. Vascular calcifications. CT CERVICAL SPINE FINDINGS Normal alignment of the cervical spine. Diffuse degenerative changes throughout the cervical spine with narrowed interspaces and endplate hypertrophic changes throughout. Degenerative changes throughout the cervical facet joints. No vertebral compression deformities. No prevertebral soft tissue swelling. C1-2 articulation appears intact. Soft tissues are unremarkable. Vascular calcifications. IMPRESSION: No acute intracranial abnormalities.  Prominent cerebral atrophy. Diffuse degenerative change throughout the cervical spine. Normal alignment. No acute displaced fractures identified. Electronically Signed   By: Lucienne Capers M.D.   On: 10/17/2015 06:20   Ct Cervical Spine Wo Contrast  10/17/2015  CLINICAL DATA:  Fall. Hematoma to the forehead and laceration to the top of the head. History of dementia. EXAM: CT HEAD WITHOUT CONTRAST CT CERVICAL SPINE WITHOUT CONTRAST TECHNIQUE: Multidetector CT imaging of the head and cervical spine was performed following the standard protocol without intravenous contrast. Multiplanar CT image reconstructions of the cervical spine were also generated. COMPARISON:  CT head 03/22/2011 FINDINGS: CT HEAD FINDINGS  Left frontal subcutaneous scalp hematoma. Diffuse cerebral atrophy. Ventricular dilatation consistent with central atrophy. No mass effect or midline shift. No abnormal extra-axial fluid collections. Gray-white matter junctions are distinct. Basal cisterns are not effaced. No evidence of acute intracranial hemorrhage. No depressed skull fractures. Visualized paranasal sinuses and mastoid air cells are not opacified. Vascular calcifications. CT CERVICAL SPINE  FINDINGS Normal alignment of the cervical spine. Diffuse degenerative changes throughout the cervical spine with narrowed interspaces and endplate hypertrophic changes throughout. Degenerative changes throughout the cervical facet joints. No vertebral compression deformities. No prevertebral soft tissue swelling. C1-2 articulation appears intact. Soft tissues are unremarkable. Vascular calcifications. IMPRESSION: No acute intracranial abnormalities.  Prominent cerebral atrophy. Diffuse degenerative change throughout the cervical spine. Normal alignment. No acute displaced fractures identified. Electronically Signed   By: Lucienne Capers M.D.   On: 10/17/2015 06:20   Medications  0.9 %  sodium chloride infusion ( Intravenous New Bag/Given 10/17/15 0606)  metoCLOPramide (REGLAN) injection 5 mg (5 mg Intravenous Given 10/17/15 0606)    Patient Vitals for the past 24 hrs:  BP Temp Temp src Pulse Resp SpO2 Weight  10/17/15 1100 130/65 mmHg - - 71 16 95 % -  10/17/15 0900 133/72 mmHg - - 83 18 96 % -  10/17/15 0700 126/72 mmHg - - 74 14 98 % -  10/17/15 0630 128/68 mmHg - - 81 17 97 % -  10/17/15 0600 132/75 mmHg - - 83 19 94 % -  10/17/15 0545 142/66 mmHg - - 91 19 97 % -  10/17/15 0430 129/67 mmHg - - 78 20 95 % -  10/17/15 0401 139/75 mmHg 97.9 F (36.6 C) Oral 83 20 95 % 220 lb (99.791 kg)      11:26 AM  - the patient was able to demonstrate his usual ability to walk with a walker. The family watched him walk. He was able to drink some liquid here in the emergency department. His vital signs are reassuring. Oxygenation is 95% on room air. Family updated on findings and plan. Questions were answered.  Nursing Notes Reviewed/ Care Coordinated Applicable Imaging Reviewed Interpretation of Laboratory Data incorporated into ED treatment  The patient appears reasonably screened and/or stabilized for discharge and I doubt any other medical condition or other [facility] requiring further screening,  evaluation, or treatment in the ED at this time prior to discharge.  Plan: Home Medications- Zofran; Home Treatments- gradually advance diet; return here if the recommended treatment, does not improve the symptoms; Recommended follow up- PCP follow-up in 2 days, return here if needed   Daleen Bo, MD 10/17/15 1127

## 2015-10-17 NOTE — Discharge Instructions (Signed)
Take Tylenol, if needed, for pain. Use ice in the sore area of the head 2 or 3 times a day for a couple of days. Start with a clear liquid diet today, then gradually advance to bland foods over the next 1-2 days. Return here, if needed, for problems.   Contusion A contusion is a deep bruise. Contusions are the result of a blunt injury to tissues and muscle fibers under the skin. The injury causes bleeding under the skin. The skin overlying the contusion may turn blue, purple, or yellow. Minor injuries will give you a painless contusion, but more severe contusions may stay painful and swollen for a few weeks.  CAUSES  This condition is usually caused by a blow, trauma, or direct force to an area of the body. SYMPTOMS  Symptoms of this condition include:  Swelling of the injured area.  Pain and tenderness in the injured area.  Discoloration. The area may have redness and then turn blue, purple, or yellow. DIAGNOSIS  This condition is diagnosed based on a physical exam and medical history. An X-ray, CT scan, or MRI may be needed to determine if there are any associated injuries, such as broken bones (fractures). TREATMENT  Specific treatment for this condition depends on what area of the body was injured. In general, the best treatment for a contusion is resting, icing, applying pressure to (compression), and elevating the injured area. This is often called the RICE strategy. Over-the-counter anti-inflammatory medicines may also be recommended for pain control.  HOME CARE INSTRUCTIONS   Rest the injured area.  If directed, apply ice to the injured area:  Put ice in a plastic bag.  Place a towel between your skin and the bag.  Leave the ice on for 20 minutes, 2-3 times per day.  If directed, apply light compression to the injured area using an elastic bandage. Make sure the bandage is not wrapped too tightly. Remove and reapply the bandage as directed by your health care provider.  If  possible, raise (elevate) the injured area above the level of your heart while you are sitting or lying down.  Take over-the-counter and prescription medicines only as told by your health care provider. SEEK MEDICAL CARE IF:  Your symptoms do not improve after several days of treatment.  Your symptoms get worse.  You have difficulty moving the injured area. SEEK IMMEDIATE MEDICAL CARE IF:   You have severe pain.  You have numbness in a hand or foot.  Your hand or foot turns pale or cold.   This information is not intended to replace advice given to you by your health care provider. Make sure you discuss any questions you have with your health care provider.   Document Released: 02/22/2005 Document Revised: 02/03/2015 Document Reviewed: 09/30/2014 Elsevier Interactive Patient Education 2016 [employer]  Facial Laceration A facial laceration is a cut on the face. These injuries can be painful and cause bleeding. Some cuts may need to be closed with stitches (sutures), skin adhesive strips, or wound glue. Cuts usually heal quickly but can leave a scar. It can take 1-2 years for the scar to go away completely. HOME CARE   Only take medicines as told by your doctor.  Follow your doctor's instructions for wound care. For Stitches:  Keep the cut clean and dry.  If you have a bandage (dressing), change it at least once a day. Change the bandage if it gets wet or dirty, or as told by your doctor.  Wash the cut with soap and water 2 times a day. Rinse the cut with water. Pat it dry with a clean towel.  Put a thin layer of medicated cream on the cut as told by your doctor.  You may shower after the first 24 hours. Do not soak the cut in water until the stitches are removed.  Have your stitches removed as told by your doctor.  Do not wear any makeup until a few days after your stitches are removed. For Skin Adhesive Strips:  Keep the cut clean and dry.  Do not get the strips  wet. You may take a bath, but be careful to keep the cut dry.  If the cut gets wet, pat it dry with a clean towel.  The strips will fall off on their own. Do not remove the strips that are still stuck to the cut. For Wound Glue:  You may shower or take baths. Do not soak or scrub the cut. Do not swim. Avoid heavy sweating until the glue falls off on its own. After a shower or bath, pat the cut dry with a clean towel.  Do not put medicine or makeup on your cut until the glue falls off.  If you have a bandage, do not put tape over the glue.  Avoid lots of sunlight or tanning lamps until the glue falls off.  The glue will fall off on its own in 5-10 days. Do not pick at the glue. After Healing:  Put sunscreen on the cut for the first year to reduce your scar. GET HELP IF:  You have a fever. GET HELP RIGHT AWAY IF:   Your cut area gets red, painful, or puffy (swollen).  You see a yellowish-white fluid (pus) coming from the cut.   This information is not intended to replace advice given to you by your health care provider. Make sure you discuss any questions you have with your health care provider.   Document Released: 11/01/2007 Document Revised: 06/05/2014 Document Reviewed: 12/26/2012 Elsevier Interactive Patient Education 2016 [place] Injury, Adult You have a head injury. Headaches and throwing up (vomiting) are common after a head injury. It should be easy to wake up from sleeping. Sometimes you must stay in the hospital. Most problems happen within the first 24 hours. Side effects may occur up to 7-10 days after the injury.  WHAT ARE THE TYPES OF HEAD INJURIES? Head injuries can be as minor as a bump. Some head injuries can be more severe. More severe head injuries include:  A jarring injury to the brain (concussion).  A bruise of the brain (contusion). This mean there is bleeding in the brain that can cause swelling.  A cracked skull (skull  fracture).  Bleeding in the brain that collects, clots, and forms a bump (hematoma). WHEN SHOULD I GET HELP RIGHT AWAY?   You are confused or sleepy.  You cannot be woken up.  You feel sick to your stomach (nauseous) or keep throwing up (vomiting).  Your dizziness or unsteadiness is getting worse.  You have very bad, lasting headaches that are not helped by medicine. Take medicines only as told by your doctor.  You cannot use your arms or legs like normal.  You cannot walk.  You notice changes in the black spots in the center of the colored part of your eye (pupil).  You have clear or bloody fluid coming from your nose or ears.  You have trouble seeing. During the  next 24 hours after the injury, you must stay with someone who can watch you. This person should get help right away (call 911 in the [place] if you start to shake and are not able to control it (have seizures), you pass out, or you are unable to wake up. HOW CAN I PREVENT A HEAD INJURY IN THE FUTURE?  Wear seat belts.  Wear a helmet while bike riding and playing sports like football.  Stay away from dangerous activities around the house. WHEN CAN I RETURN TO NORMAL ACTIVITIES AND ATHLETICS? See your doctor before doing these activities. You should not do normal activities or play contact sports until 1 week after the following symptoms have stopped:  Headache that does not go away.  Dizziness.  Poor attention.  Confusion.  Memory problems.  Sickness to your stomach or throwing up.  Tiredness.  Fussiness.  Bothered by bright lights or loud noises.  Anxiousness or depression.  Restless sleep. MAKE SURE YOU:   Understand these instructions.  Will watch your condition.  Will get help right away if you are not doing well or get worse.   This information is not intended to replace advice given to you by your health care provider. Make sure you discuss any questions you have with your health care  provider.   Document Released: 04/27/2008 Document Revised: 06/05/2014 Document Reviewed: 01/20/2013 Elsevier Interactive Patient Education 2016 [employer]  Nausea and Vomiting Nausea means you feel sick to your stomach. Throwing up (vomiting) is a reflex where stomach contents come out of your mouth. HOME CARE   Take medicine as told by your doctor.  Do not force yourself to eat. However, you do need to drink fluids.  If you feel like eating, eat a normal diet as told by your doctor.  Eat rice, wheat, potatoes, bread, lean meats, yogurt, fruits, and vegetables.  Avoid high-fat foods.  Drink enough fluids to keep your pee (urine) clear or pale yellow.  Ask your doctor how to replace body fluid losses (rehydrate). Signs of body fluid loss (dehydration) include:  Feeling very thirsty.  Dry lips and mouth.  Feeling dizzy.  Dark pee.  Peeing less than normal.  Feeling confused.  Fast breathing or heart rate. GET HELP RIGHT AWAY IF:   You have blood in your throw up.  You have black or bloody poop (stool).  You have a bad headache or stiff neck.  You feel confused.  You have bad belly (abdominal) pain.  You have chest pain or trouble breathing.  You do not pee at least once every 8 hours.  You have cold, clammy skin.  You keep throwing up after 24 to 48 hours.  You have a fever. MAKE SURE YOU:   Understand these instructions.  Will watch your condition.  Will get help right away if you are not doing well or get worse.   This information is not intended to replace advice given to you by your health care provider. Make sure you discuss any questions you have with your health care provider.   Document Released: 11/01/2007 Document Revised: 08/07/2011 Document Reviewed: 10/14/2010 Elsevier Interactive Patient Education 2016 [place], [place], or Adhesive Wound Closure Health care providers use stitches (sutures), staples, and certain  glue (skin adhesives) to hold skin together while it heals (wound closure). You may need this treatment after you have surgery or if you cut your skin accidentally. These methods help your skin to heal more quickly and make  it less likely that you will have a scar. A wound may take several months to heal completely. The type of wound you have determines when your wound gets closed. In most cases, the wound is closed as soon as possible (primary skin closure). Sometimes, closure is delayed so the wound can be cleaned and allowed to heal naturally. This reduces the chance of infection. Delayed closure may be needed if your wound:  Is caused by a bite.  Happened more than 6 hours ago.  Involves loss of skin or the tissues under the skin.  Has dirt or debris in it that cannot be removed.  Is infected. WHAT ARE THE DIFFERENT KINDS OF WOUND CLOSURES? There are many options for wound closure. The one that your health care provider uses depends on how deep and how large your wound is. Adhesive Glue To use this type of glue to close a wound, your health care provider holds the edges of the wound together and paints the glue on the surface of your skin. You may need more than one layer of glue. Then the wound may be covered with a light bandage (dressing). This type of skin closure may be used for small wounds that are not deep (superficial). Using glue for wound closure is less painful than other methods. It does not require a medicine that numbs the area (local anesthetic). This method also leaves nothing to be removed. Adhesive glue is often used for children and on facial wounds. Adhesive glue cannot be used for wounds that are deep, uneven, or bleeding. It is not used inside of a wound.  Adhesive Strips These strips are made of sticky (adhesive), porous paper. They are applied across your skin edges like a regular adhesive bandage. You leave them on until they fall off. Adhesive strips may be used to  close very superficial wounds. They may also be used along with sutures to improve the closure of your skin edges.  Sutures Sutures are the oldest method of wound closure. Sutures can be made from natural substances, such as silk, or from synthetic materials, such as nylon and steel. They can be made from a material that your body can break down as your wound heals (absorbable), or they can be made from a material that needs to be removed from your skin (nonabsorbable). They come in many different strengths and sizes. Your health care provider attaches the sutures to a steel needle on one end. Sutures can be passed through your skin, or through the tissues beneath your skin. Then they are tied and cut. Your skin edges may be closed in one continuous stitch or in separate stitches. Sutures are strong and can be used for all kinds of wounds. Absorbable sutures may be used to close tissues under the skin. The disadvantage of sutures is that they may cause skin reactions that lead to infection. Nonabsorbable sutures need to be removed. Staples When surgical staples are used to close a wound, the edges of your skin on both sides of the wound are brought close together. A staple is placed across the wound, and an instrument secures the edges together. Staples are often used to close surgical cuts (incisions). Staples are faster to use than sutures, and they cause less skin reaction. Staples need to be removed using a tool that bends the staples away from your skin. HOW DO I CARE FOR MY WOUND CLOSURE?  Take medicines only as directed by your health care provider.  If you were prescribed  an antibiotic medicine for your wound, finish it all even if you start to feel better.  Use ointments or creams only as directed by your health care provider.  Wash your hands with soap and water before and after touching your wound.  Do not soak your wound in water. Do not take baths, swim, or use a hot tub until your  health care provider approves.  Ask your health care provider when you can start showering. Cover your wound if directed by your health care provider.  Do not take out your own sutures or staples.  Do not pick at your wound. Picking can cause an infection.  Keep all follow-up visits as directed by your health care provider. This is important. HOW LONG WILL I HAVE MY WOUND CLOSURE?  Leave adhesive glue on your skin until the glue peels away.  Leave adhesive strips on your skin until the strips fall off.  Absorbable sutures will dissolve within several days.  Nonabsorbable sutures and staples must be removed. The location of the wound will determine how long they stay in. This can range from several days to a couple of weeks. WHEN SHOULD I SEEK HELP FOR MY WOUND CLOSURE? Contact your health care provider if:  You have a fever.  You have chills.  You have drainage, redness, swelling, or pain at your wound.  There is a bad smell coming from your wound.  The skin edges of your wound start to separate after your sutures have been removed.  Your wound becomes thick, raised, and darker in color after your sutures come out (scarring).   This information is not intended to replace advice given to you by your health care provider. Make sure you discuss any questions you have with your health care provider.   Document Released: 02/07/2001 Document Revised: 06/05/2014 Document Reviewed: 10/22/2013 Elsevier Interactive Patient Education [employer].

## 2015-10-17 NOTE — ED Provider Notes (Signed)
CSN: [id number]     Arrival date & time 10/17/15  0350 History   First MD Initiated Contact with Patient 10/17/15 0400     Chief Complaint  Patient presents with  . Fall      Patient is a 82 y.o. male presenting with fall. The history is provided by the EMS personnel, a caregiver and a relative. The history is limited by the condition of the patient (Hx Dementia).  Fall  Pt was seen at 0400. Per EMS and pt's family: Pt sitting on his bed and fell forward, hitting the wall. Pt has abrasions and hematoma to scalp. Pt has significant hx of dementia, EMS states family told them he was acting per his baseline. Pt with one episode of N/V on arrival to ED.   Past Medical History  Diagnosis Date  . Diabetes mellitus     Diet control   . Hypertension   . History of pneumonia   . Hemorrhoids   . MI (myocardial infarction) ([place]) 1996    percutaneous coronary intervention   . GERD (gastroesophageal reflux disease)   . Hypothyroidism   . Diverticulosis   . Aortic insufficiency   . Aortic valve sclerosis   . Dyslipidemia   . Hard of hearing   . Small bowel obstruction ([place])   . Bronchitis   . CHF (congestive heart failure) ([place])   . Dementia   . Hernia cerebri [facility])    Past Surgical History  Procedure Laterality Date  . Exploratory laparotomy w/ bowel resection      ventral hernia repair  . Transurethral resection of prostate    . Cholecystectomy    . Colonoscopy  2008  . Abdominal hernia repair      multiple sx's  . Cataract extraction      left eye   Family History  Problem Relation Age of Onset  . Colon cancer Neg Hx    Social History  Substance Use Topics  . Smoking status: Former Smoker    Quit date: 08/09/1942  . Smokeless tobacco: Never Used  . Alcohol Use: No    Review of Systems  Unable to perform ROS: Dementia      Allergies  Review of patient's allergies indicates no known allergies.  Home Medications   Prior to Admission medications   Medication  Sig Start Date End Date Taking? Authorizing Provider  amLODipine-benazepril (LOTREL) 5-20 MG capsule TAKE (1) CAPSULE DAILY 07/05/15  Yes Claretta Fraise, MD  furosemide (LASIX) 40 MG tablet Take 2 tablets (80 mg total) by mouth daily. 08/31/15  Yes Claretta Fraise, MD  gabapentin (NEURONTIN) 300 MG capsule Take 1 capsule (300 mg total) by mouth at bedtime. 10/04/15  Yes Claretta Fraise, MD  hydrOXYzine (VISTARIL) 25 MG capsule Take 1 capsule (25 mg total) by mouth every 6 (six) hours as needed for anxiety. 06/25/15  Yes Claretta Fraise, MD  JANUVIA 50 MG tablet TAKE 1 TABLET DAILY 10/04/15  Yes Claretta Fraise, MD  levothyroxine (SYNTHROID, LEVOTHROID) 88 MCG tablet Take 1 tablet (88 mcg total) by mouth daily. 08/03/15  Yes Claretta Fraise, MD  metoprolol succinate (TOPROL-XL) 50 MG 24 hr tablet TAKE 1 TABLET ONCE A DAY WITH A MEAL 07/05/15  Yes Claretta Fraise, MD  omeprazole (PRILOSEC) 20 MG capsule TAKE (1) CAPSULE DAILY Patient taking differently: TAKE (1) CAPSULE DAILY AT BEDTIME 07/05/15  Yes Claretta Fraise, MD  simvastatin (ZOCOR) 20 MG tablet TAKE 1 TABLET DAILY 07/05/15  Yes Claretta Fraise, MD  traMADol Veatrice Bourbon) 50  MG tablet Take 1 tablet (50 mg total) by mouth 4 (four) times daily as needed for moderate pain. 02/23/15  Yes Claretta Fraise, MD  albuterol (PROVENTIL) (2.5 MG/3ML) 0.083% nebulizer solution Take 3 mLs (2.5 mg total) by nebulization every 6 (six) hours as needed for wheezing or shortness of breath. Patient not taking: Reported on 10/05/2015 07/02/14   Chipper Herb, MD  aspirin 81 MG tablet Take 81 mg by mouth daily.    Historical Provider, MD  blood glucose meter kit and supplies KIT Dispense based on patient and insurance. Test bid. E11.9 10/19/14   Chipper Herb, MD  blood glucose meter kit and supplies KIT Dispense based on patient and insurance preference. Use up to four times daily as directed. (FOR ICD-9 250.00, 250.01). 03/15/15   Claretta Fraise, MD  cholecalciferol (VITAMIN D) 1000 units tablet Take 1,000  Units by mouth daily.    Historical Provider, MD  clobetasol ointment (TEMOVATE) 6.30 % Apply 1 application topically 2 (two) times daily. 01/15/15   Tiffany A Gann, PA-C  docusate sodium (COLACE) 100 MG capsule Take 300 mg by mouth at bedtime.    Historical Provider, MD  glucose blood test strip Check blood sugar bid. DX- E11.9 10/19/14   Chipper Herb, MD  ketoconazole (NIZORAL) 2 % cream Apply 1 application topically 2 (two) times daily. 07/15/15   Historical Provider, MD  mupirocin ointment (BACTROBAN) 2 % Apply 1 application topically 2 (two) times daily. 07/15/15   Historical Provider, MD  Zack Seal Laxative 64.8-324 MG TABS Take 1 tablet by mouth at bedtime.     Historical Provider, MD  Omega-3 Fatty Acids (FISH OIL) 1000 MG CAPS Take 1 capsule by mouth daily.    Historical Provider, MD  potassium chloride 20 MEQ/15ML (10%) SOLN Take 15 mLs (20 mEq total) by mouth daily. 10/12/15   Claretta Fraise, MD  potassium chloride SA (K-DUR,KLOR-CON) 20 MEQ tablet Take 1 tablet (20 mEq total) by mouth daily. As a potassium supplement 10/06/15   Claretta Fraise, MD  triamcinolone cream (KENALOG) 0.1 % Apply 1 application topically 2 (two) times daily. 08/31/15   Claretta Fraise, MD   BP 139/75 mmHg  Pulse 83  Temp(Src) 97.9 F (36.6 C) (Oral)  Resp 20  Wt 220 lb (99.791 kg)  SpO2 95% Physical Exam  0405: Physical examination:  Nursing notes reviewed; Vital signs and O2 SAT reviewed;  Constitutional: Well developed, Well nourished, Well hydrated, In no acute distress; Head:  Normocephalic, +superificial abrasion to vertex scalp. +superficial small lac approximately 0.5cm to left forehead with localized hematoma and superficial abrasion just distal to lac.; Eyes: EOMI, PERRL, No scleral icterus; ENMT: Mouth and pharynx normal, Mucous membranes moist; Neck: Supple, Full range of motion, No lymphadenopathy; Cardiovascular: Regular rate and rhythm, No gallop; Respiratory: Breath sounds clear & equal bilaterally,  No wheezes. Normal respiratory effort/excursion; Chest: Nontender, Movement normal; Abdomen: Soft, protuberant. Nontender, Nondistended, Normal bowel sounds; Genitourinary: No CVA tenderness; Extremities: Pulses normal, No tenderness, No edema, No calf edema or asymmetry.; Neuro: Awake, alert, confused per hx dementia. +very HOH. No facial droop. Speech clear. Moves all extremities spontaneously on stretcher without apparent gross focal motor deficits.; Skin: Color normal, Warm, Dry.   ED Course  Procedures (including critical care time)  LACERATION REPAIR Performed by: Alfonzo Feller Authorized by: Alfonzo Feller Consent: Verbal consent obtained. Risks and benefits: risks, benefits and alternatives were discussed Consent given by: patient Patient identity confirmed: provided demographic data Prepped and Draped in  normal sterile fashion Wound explored  Laceration Location: left forehead  Laceration Length: 0.5cm  No Foreign Bodies seen or palpated  Local anesthetic: none  Irrigation method: NS Amount of cleaning: standard  Skin closure: dermabond  Patient tolerance: Patient tolerated the procedure well with no immediate complications.      Labs Review Imaging Review I have personally reviewed and evaluated these images and lab results as part of my medical decision-making.   EKG Interpretation   Date/Time:  Sunday Oct 17 2015 04:04:00 EDT Ventricular Rate:  90 PR Interval:  206 QRS Duration: 118 QT Interval:  408 QTC Calculation: 499 R Axis:   -32 Text Interpretation:  Sinus rhythm Nonspecific intraventricular conduction  delay Low voltage, precordial leads Nonspecific ST and T wave abnormality  Baseline wander When compared with ECG of 08/10/2010 No significant change  was found Confirmed by [facility]  MD, Nunzio Cory [phone]) on 10/17/2015 4:13:07  AM      MDM  MDM Reviewed: previous chart, nursing note and vitals Reviewed previous: labs and  ECG Interpretation: labs, ECG, x-ray and CT scan     Results for orders placed or performed during the hospital encounter of 10/17/15  CBC with Differential  Result Value Ref Range   WBC 7.7 4.0 - 10.5 K/uL   RBC 4.47 4.22 - 5.81 MIL/uL   Hemoglobin 13.1 13.0 - 17.0 g/dL   HCT 38.9 (L) 39.0 - 52.0 %   MCV 87.0 78.0 - 100.0 fL   MCH 29.3 26.0 - 34.0 pg   MCHC 33.7 30.0 - 36.0 g/dL   RDW 13.4 11.5 - 15.5 %   Platelets 177 150 - 400 K/uL   Neutrophils Relative % 57 %   Neutro Abs 4.4 1.7 - 7.7 K/uL   Lymphocytes Relative 34 %   Lymphs Abs 2.7 0.7 - 4.0 K/uL   Monocytes Relative 6 %   Monocytes Absolute 0.5 0.1 - 1.0 K/uL   Eosinophils Relative 2 %   Eosinophils Absolute 0.2 0.0 - 0.7 K/uL   Basophils Relative 1 %   Basophils Absolute 0.1 0.0 - 0.1 K/uL  Troponin I  Result Value Ref Range   Troponin I <0.03 <0.031 ng/mL  Urinalysis, Routine w reflex microscopic  Result Value Ref Range   Color, Urine YELLOW YELLOW   APPearance CLEAR CLEAR   Specific Gravity, Urine <1.005 (L) 1.005 - 1.030   pH 5.5 5.0 - 8.0   Glucose, UA NEGATIVE NEGATIVE mg/dL   Hgb urine dipstick NEGATIVE NEGATIVE   Bilirubin Urine NEGATIVE NEGATIVE   Ketones, ur NEGATIVE NEGATIVE mg/dL   Protein, ur NEGATIVE NEGATIVE mg/dL   Nitrite NEGATIVE NEGATIVE   Leukocytes, UA NEGATIVE NEGATIVE  Comprehensive metabolic panel  Result Value Ref Range   Sodium 135 135 - 145 mmol/L   Potassium 3.4 (L) 3.5 - 5.1 mmol/L   Chloride 102 101 - 111 mmol/L   CO2 25 22 - 32 mmol/L   Glucose, Bld 166 (H) 65 - 99 mg/dL   BUN 19 6 - 20 mg/dL   Creatinine, Ser 1.45 (H) 0.61 - 1.24 mg/dL   Calcium 8.5 (L) 8.9 - 10.3 mg/dL   Total Protein 6.5 6.5 - 8.1 g/dL   Albumin 3.4 (L) 3.5 - 5.0 g/dL   AST 19 15 - 41 U/L   ALT 16 (L) 17 - 63 U/L   Alkaline Phosphatase 62 38 - 126 U/L   Total Bilirubin 0.5 0.3 - 1.2 mg/dL   GFR calc non Af Amer 41 (  L) >60 mL/min   GFR calc Af Amer 48 (L) >60 mL/min   Anion gap 8 5 - 15  Lipase,  blood  Result Value Ref Range   Lipase 24 11 - 51 U/L   Dg Chest 1 View 10/17/2015  CLINICAL DATA:  Altered mental status. Fell prior to arrival. Hematoma on the forehead. EXAM: CHEST 1 VIEW COMPARISON:  08/08/2015 FINDINGS: Cardiac enlargement. No pulmonary vascular congestion. Atelectasis in the left lung base. Blunting of the left costophrenic angle may be due to fluid or thickened pleura. Probable old left rib fractures. Calcified and tortuous aorta. Right lung is clear. Degenerative changes in the spine. IMPRESSION: Atelectasis with fluid or thickened pleura in the left base. Cardiac enlargement without vascular congestion or edema. Electronically Signed   By: Lucienne Capers M.D.   On: 10/17/2015 05:21   Ct Head Wo Contrast 10/17/2015  CLINICAL DATA:  Fall. Hematoma to the forehead and laceration to the top of the head. History of dementia. EXAM: CT HEAD WITHOUT CONTRAST CT CERVICAL SPINE WITHOUT CONTRAST TECHNIQUE: Multidetector CT imaging of the head and cervical spine was performed following the standard protocol without intravenous contrast. Multiplanar CT image reconstructions of the cervical spine were also generated. COMPARISON:  CT head 03/22/2011 FINDINGS: CT HEAD FINDINGS Left frontal subcutaneous scalp hematoma. Diffuse cerebral atrophy. Ventricular dilatation consistent with central atrophy. No mass effect or midline shift. No abnormal extra-axial fluid collections. Gray-white matter junctions are distinct. Basal cisterns are not effaced. No evidence of acute intracranial hemorrhage. No depressed skull fractures. Visualized paranasal sinuses and mastoid air cells are not opacified. Vascular calcifications. CT CERVICAL SPINE FINDINGS Normal alignment of the cervical spine. Diffuse degenerative changes throughout the cervical spine with narrowed interspaces and endplate hypertrophic changes throughout. Degenerative changes throughout the cervical facet joints. No vertebral compression  deformities. No prevertebral soft tissue swelling. C1-2 articulation appears intact. Soft tissues are unremarkable. Vascular calcifications. IMPRESSION: No acute intracranial abnormalities.  Prominent cerebral atrophy. Diffuse degenerative change throughout the cervical spine. Normal alignment. No acute displaced fractures identified. Electronically Signed   By: Lucienne Capers M.D.   On: 10/17/2015 06:20   Ct Cervical Spine Wo Contrast 10/17/2015  CLINICAL DATA:  Fall. Hematoma to the forehead and laceration to the top of the head. History of dementia. EXAM: CT HEAD WITHOUT CONTRAST CT CERVICAL SPINE WITHOUT CONTRAST TECHNIQUE: Multidetector CT imaging of the head and cervical spine was performed following the standard protocol without intravenous contrast. Multiplanar CT image reconstructions of the cervical spine were also generated. COMPARISON:  CT head 03/22/2011 FINDINGS: CT HEAD FINDINGS Left frontal subcutaneous scalp hematoma. Diffuse cerebral atrophy. Ventricular dilatation consistent with central atrophy. No mass effect or midline shift. No abnormal extra-axial fluid collections. Gray-white matter junctions are distinct. Basal cisterns are not effaced. No evidence of acute intracranial hemorrhage. No depressed skull fractures. Visualized paranasal sinuses and mastoid air cells are not opacified. Vascular calcifications. CT CERVICAL SPINE FINDINGS Normal alignment of the cervical spine. Diffuse degenerative changes throughout the cervical spine with narrowed interspaces and endplate hypertrophic changes throughout. Degenerative changes throughout the cervical facet joints. No vertebral compression deformities. No prevertebral soft tissue swelling. C1-2 articulation appears intact. Soft tissues are unremarkable. Vascular calcifications. IMPRESSION: No acute intracranial abnormalities.  Prominent cerebral atrophy. Diffuse degenerative change throughout the cervical spine. Normal alignment. No acute  displaced fractures identified. Electronically Signed   By: Lucienne Capers M.D.   On: 10/17/2015 06:20     Results for CORDARO, MUKAI (MRN [id number])  as of 10/17/2015 06:02  Ref. Range 07/05/2015 11:07 08/08/2015 15:07 08/10/2015 13:46 08/31/2015 15:26 10/05/2015 11:27 10/17/2015 04:20  BUN Latest Ref Range: 6-20 mg/dL 18 16 19 18 15 19   Creatinine Latest Ref Range: 0.61-1.24 mg/dL 1.50 (H) 1.49 (H) 1.32 (H) 1.23 1.34 (H) 1.45 (H)    0545:  Pt with persistent N/V since arrival to ED; abd remains benign. IV reglan given (QT prolonged on EKG) and CT A/P ordered. Judicious IVF ordered for mildly elevated Cr.  Pt's O2 Sats will intermittently decrease, but when he is leaned back (away from his large abd), his Sats again increase. VS otherwise stable. Dermabond applied to left forehead superficial lac with good hemostasis. Family now at bedside; denies pt had syncopal episode today, or that he has had N/V before arrival to ED. Family agreeable with CT A/P.  0700:  Sign out to Dr. Eulis Foster.  Francine Graven, DO 10/17/15 [phone]

## 2015-10-18 ENCOUNTER — Other Ambulatory Visit: Payer: Self-pay | Admitting: Family Medicine

## 2015-10-18 LAB — URINE CULTURE: CULTURE: NO GROWTH

## 2015-10-18 NOTE — Telephone Encounter (Signed)
Patient called requesting refill on tramadol.  Refill request sent to Dr. Livia Snellen

## 2015-10-20 ENCOUNTER — Other Ambulatory Visit: Payer: Self-pay

## 2015-10-20 MED ORDER — TRAMADOL HCL 50 MG PO TABS: 50.0000 mg | ORAL_TABLET | Freq: Four times a day (QID) | ORAL | Status: DC | PRN

## 2015-10-20 NOTE — Telephone Encounter (Signed)
RX for Tramadol called into [facility] per Dr Livia Snellen

## 2015-10-20 NOTE — Telephone Encounter (Signed)
Last seen 10/05/15 Dr Livia Snellen  If approved route to nurse to call into [facility]

## 2015-10-20 NOTE — Telephone Encounter (Signed)
Please review and advise.

## 2015-10-26 DIAGNOSIS — M6281 Muscle weakness (generalized): Secondary | ICD-10-CM | POA: Diagnosis not present

## 2015-10-27 ENCOUNTER — Encounter: Payer: Self-pay | Admitting: Family Medicine

## 2015-10-27 ENCOUNTER — Ambulatory Visit (INDEPENDENT_AMBULATORY_CARE_PROVIDER_SITE_OTHER): Payer: Commercial Managed Care - HMO | Admitting: Family Medicine

## 2015-10-27 VITALS — BP 110/62 | HR 95 | Temp 97.2°F | Ht 65.0 in | Wt 203.4 lb

## 2015-10-27 DIAGNOSIS — I10 Essential (primary) hypertension: Secondary | ICD-10-CM | POA: Diagnosis not present

## 2015-10-27 DIAGNOSIS — M549 Dorsalgia, unspecified: Secondary | ICD-10-CM

## 2015-10-27 DIAGNOSIS — G309 Alzheimer's disease, unspecified: Secondary | ICD-10-CM

## 2015-10-27 DIAGNOSIS — G8929 Other chronic pain: Secondary | ICD-10-CM | POA: Diagnosis not present

## 2015-10-27 DIAGNOSIS — E039 Hypothyroidism, unspecified: Secondary | ICD-10-CM | POA: Diagnosis not present

## 2015-10-27 DIAGNOSIS — D509 Iron deficiency anemia, unspecified: Secondary | ICD-10-CM

## 2015-10-27 DIAGNOSIS — R2689 Other abnormalities of gait and mobility: Secondary | ICD-10-CM | POA: Diagnosis not present

## 2015-10-27 DIAGNOSIS — F0280 Dementia in other diseases classified elsewhere without behavioral disturbance: Secondary | ICD-10-CM | POA: Diagnosis not present

## 2015-10-27 MED ORDER — TRAMADOL HCL 50 MG PO TABS: 50.0000 mg | ORAL_TABLET | Freq: Four times a day (QID) | ORAL | Status: DC | PRN

## 2015-10-27 MED ORDER — POTASSIUM CHLORIDE CRYS ER 10 MEQ PO TBCR: 10.0000 meq | EXTENDED_RELEASE_TABLET | Freq: Two times a day (BID) | ORAL | Status: DC

## 2015-10-27 MED ORDER — METOPROLOL SUCCINATE ER 25 MG PO TB24: 25.0000 mg | ORAL_TABLET | Freq: Every day | ORAL | Status: DC

## 2015-10-27 NOTE — Progress Notes (Signed)
Subjective:  Patient ID: [name], male    DOB: October 28, 1924  Age: 82 y.o. MRN: [id number]  CC: Hospitalization Follow-up   HPI [name] presents for Total about 10 days ago. Daughter says he fell sideways and hit his head on the floor. He gets off balance easily. He had applied to a small laceration at the left forehead/scalp anteriorly. He also scratched up his hand but that is doing well. The left forehead lesion is healing nicely. The daughter says he started rubbing it and it pulled apart but has continued to heal in spite of that. The daughter says his dementia is severe enough that he needs fairly constant supervision. They constantly have to remind him to use his walker because he is prone to falling. He seems off balance when he gets up.  Daughter states he does complain about being cold when he is lucid. This is in spite of the temperature in the room being comfortable for others in the room. No other thyroid symptoms noted. He does have a history of anemia, but has not complained in the past of being cold when anemic. Daughter requests evaluation for the anemia Due to dementia history is taken from records and from the daughter who is with him today. She is his primary caretaker.  History [name] has a past medical history of Diabetes mellitus; Hypertension; History of pneumonia; Hemorrhoids; MI (myocardial infarction) ([place]) (1996); GERD (gastroesophageal reflux disease); Hypothyroidism; Diverticulosis; Aortic insufficiency; Aortic valve sclerosis; Dyslipidemia; Hard of hearing; Small bowel obstruction ([place]); Bronchitis; CHF (congestive heart failure) ([place]); Dementia; and Hernia cerebri ([place]).   He has past surgical history that includes Exploratory laparotomy w/ bowel resection; Transurethral resection of prostate; Cholecystectomy; Colonoscopy (2008); Abdominal hernia repair; and Cataract extraction.   His family history is negative for Colon cancer.He reports that he quit  smoking about 73 years ago. He has never used smokeless tobacco. He reports that he does not drink alcohol or use illicit drugs.  Current Outpatient Prescriptions on File Prior to Visit  Medication Sig Dispense Refill  . albuterol (PROVENTIL) (2.5 MG/3ML) 0.083% nebulizer solution Take 3 mLs (2.5 mg total) by nebulization every 6 (six) hours as needed for wheezing or shortness of breath. 150 mL 3  . amLODipine-benazepril (LOTREL) 5-20 MG capsule TAKE (1) CAPSULE DAILY 30 capsule 5  . aspirin 81 MG tablet Take 81 mg by mouth daily.    . blood glucose meter kit and supplies KIT Dispense based on patient and insurance. Test bid. E11.9 1 each 0  . cholecalciferol (VITAMIN D) 1000 units tablet Take 1,000 Units by mouth daily.    Marland Kitchen docusate sodium (COLACE) 100 MG capsule Take 300 mg by mouth at bedtime.    . furosemide (LASIX) 40 MG tablet Take 2 tablets (80 mg total) by mouth daily. 60 tablet 5  . gabapentin (NEURONTIN) 300 MG capsule Take 1 capsule (300 mg total) by mouth at bedtime. 30 capsule 2  . glucose blood test strip Check blood sugar bid. DX- E11.9 100 each 2  . hydrOXYzine (VISTARIL) 25 MG capsule Take 1 capsule (25 mg total) by mouth every 6 (six) hours as needed for anxiety. 30 capsule 2  . JANUVIA 50 MG tablet TAKE 1 TABLET DAILY 30 tablet 2  . levothyroxine (SYNTHROID, LEVOTHROID) 88 MCG tablet Take 1 tablet (88 mcg total) by mouth daily. 30 tablet 10  . Natural Senna Laxative 64.8-324 MG TABS Take 1 tablet by mouth at bedtime.     Marland Kitchen  Omega-3 Fatty Acids (FISH OIL) 1000 MG CAPS Take 1 capsule by mouth daily.    Marland Kitchen omeprazole (PRILOSEC) 20 MG capsule TAKE (1) CAPSULE DAILY (Patient taking differently: TAKE (1) CAPSULE DAILY AT BEDTIME) 30 capsule 5  . potassium chloride 20 MEQ/15ML (10%) SOLN Take 15 mLs (20 mEq total) by mouth daily. 450 mL 3  . simvastatin (ZOCOR) 20 MG tablet TAKE 1 TABLET DAILY 30 tablet 5  . triamcinolone cream (KENALOG) 0.1 % Apply 1 application topically 2 (two)  times daily. 30 g 0  . ondansetron (ZOFRAN) 8 MG tablet Take 1 tablet (8 mg total) by mouth every 8 (eight) hours as needed for nausea or vomiting. (Patient not taking: Reported on 10/27/2015) 20 tablet 0   No current facility-administered medications on file prior to visit.    ROS Review of Systems  Unable to perform ROS: Dementia    Objective:  BP 110/62 mmHg  Pulse 95  Temp(Src) 97.2 F (36.2 C) (Oral)  Ht _0  (1.651 m)  Wt 203 lb 6.4 oz (92.262 kg)  BMI 33.85 kg/m2  SpO2 95%  Physical Exam  Constitutional: He appears well-developed and well-nourished.  HENT:  Head: Normocephalic and atraumatic.  Right Ear: Tympanic membrane and external ear normal. No decreased hearing is noted.  Left Ear: Tympanic membrane and external ear normal. No decreased hearing is noted.  Mouth/Throat: No oropharyngeal exudate or posterior oropharyngeal erythema.  Eyes: Pupils are equal, round, and reactive to light.  Neck: Normal range of motion. Neck supple.  Cardiovascular: Normal rate and regular rhythm.   No murmur heard. Pulmonary/Chest: Breath sounds normal. No respiratory distress.  Abdominal: Soft. Bowel sounds are normal. He exhibits no mass. There is no tenderness.  Skin:  Excellent healing of a 4 mm laceration at the left scalp/forehead border. No signs of infection.  Psychiatric: His mood appears not anxious. His affect is blunt. His speech is delayed. Cognition and memory are impaired. He is noncommunicative. He exhibits abnormal recent memory and abnormal remote memory. He is inattentive.  Vitals reviewed.   Assessment & Plan:   [name] was seen today for hospitalization follow-up.  Diagnoses and all orders for this visit:  Essential hypertension -     CMP14+EGFR  Alzheimer's dementia  Iron deficiency anemia, unspecified -     CBC with Differential/Platelet  Hypothyroidism, unspecified hypothyroidism type -     CBC with Differential/Platelet -     CMP14+EGFR -     TSH  + free T4  Chronic back pain  Other orders -     metoprolol succinate (TOPROL-XL) 25 MG 24 hr tablet; Take 1 tablet (25 mg total) by mouth daily. Take with or immediately following a meal. -     potassium chloride SA (K-DUR,KLOR-CON) 10 MEQ tablet; Take 1 tablet (10 mEq total) by mouth 2 (two) times daily. As a potassium supplement -     traMADol (ULTRAM) 50 MG tablet; Take 1 tablet (50 mg total) by mouth 4 (four) times daily as needed for moderate pain.  I have discontinued Mr. Abruzzo ketoconazole. I have changed his potassium chloride SA to potassium chloride. I have also changed his metoprolol succinate. Additionally, I am having him maintain his aspirin, Fish Oil, NATURAL SENNA LAXATIVE, albuterol, blood glucose meter kit and supplies, glucose blood, hydrOXYzine, amLODipine-benazepril, omeprazole, simvastatin, levothyroxine, docusate sodium, cholecalciferol, triamcinolone cream, furosemide, JANUVIA, gabapentin, potassium chloride, ondansetron, and traMADol.  Meds ordered this encounter  Medications  . metoprolol succinate (TOPROL-XL) 25 MG 24 hr tablet  Sig: Take 1 tablet (25 mg total) by mouth daily. Take with or immediately following a meal.    Dispense:  30 tablet    Refill:  2  . potassium chloride SA (K-DUR,KLOR-CON) 10 MEQ tablet    Sig: Take 1 tablet (10 mEq total) by mouth 2 (two) times daily. As a potassium supplement    Dispense:  60 tablet    Refill:  2  . traMADol (ULTRAM) 50 MG tablet    Sig: Take 1 tablet (50 mg total) by mouth 4 (four) times daily as needed for moderate pain.    Dispense:  60 tablet    Refill:  5   Try giving the tramadol at bedtime to avoid unsteadiness with gait. Use only when pain is severe during the daytime hours. Supervise him closely and make sure he uses his walker if the medicine is given during the day.  To decrease unsteadiness I have decreased the dose of metoprolol. The new medicine should be given one whole pill daily. You can use  his existing supply at one half tablet daily until it is exhausted.  I have written for a half-strength potassium pill hopefully it will be small enough for him to swallow better. However he will need to take it twice a day instead of once a day.  Follow-up: Return in about 2 months (around 12/27/2015), or if symptoms worsen or fail to improve.  Claretta Fraise, M.D.

## 2015-10-27 NOTE — Patient Instructions (Signed)
Try giving the tramadol at bedtime to avoid unsteadiness with gait. Use only when pain is severe during the daytime hours. Supervise him closely and make sure he uses his walker if the medicine is given during the day.  To decrease unsteadiness I have decreased the dose of metoprolol. The new medicine should be given one whole pill daily. You can use his existing supply at one half tablet daily until it is exhausted.  I have written for a half-strength potassium pill hopefully it will be small enough for him to swallow better. However he will need to take it twice a day instead of once a day.

## 2015-10-28 LAB — CBC WITH DIFFERENTIAL/PLATELET
Basophils Absolute: 0.1 10*3/uL (ref 0.0–0.2)
Basos: 1 %
EOS (ABSOLUTE): 0.2 10*3/uL (ref 0.0–0.4)
Eos: 2 %
Hematocrit: 39.1 % (ref 37.5–51.0)
Hemoglobin: 13.1 g/dL (ref 12.6–17.7)
IMMATURE GRANS (ABS): 0 10*3/uL (ref 0.0–0.1)
IMMATURE GRANULOCYTES: 0 %
LYMPHS: 31 %
Lymphocytes Absolute: 2.7 10*3/uL (ref 0.7–3.1)
MCH: 29.2 pg (ref 26.6–33.0)
MCHC: 33.5 g/dL (ref 31.5–35.7)
MCV: 87 fL (ref 79–97)
MONOS ABS: 0.7 10*3/uL (ref 0.1–0.9)
Monocytes: 8 %
NEUTROS PCT: 58 %
Neutrophils Absolute: 5.2 10*3/uL (ref 1.4–7.0)
PLATELETS: 214 10*3/uL (ref 150–379)
RBC: 4.49 x10E6/uL (ref 4.14–5.80)
RDW: 14.3 % (ref 12.3–15.4)
WBC: 8.9 10*3/uL (ref 3.4–10.8)

## 2015-10-28 LAB — CMP14+EGFR
ALT: 16 IU/L (ref 0–44)
AST: 11 IU/L (ref 0–40)
Albumin/Globulin Ratio: 1.3 (ref 1.2–2.2)
Albumin: 3.6 g/dL (ref 3.5–4.7)
Alkaline Phosphatase: 81 IU/L (ref 39–117)
BUN/Creatinine Ratio: 10 (ref 10–24)
BUN: 14 mg/dL (ref 8–27)
Bilirubin Total: 0.3 mg/dL (ref 0.0–1.2)
CALCIUM: 9.2 mg/dL (ref 8.6–10.2)
CO2: 25 mmol/L (ref 18–29)
CREATININE: 1.42 mg/dL — AB (ref 0.76–1.27)
Chloride: 98 mmol/L (ref 96–106)
GFR, EST AFRICAN AMERICAN: 51 mL/min/{1.73_m2} — AB (ref >59)
GFR, EST NON AFRICAN AMERICAN: 44 mL/min/{1.73_m2} — AB (ref >59)
GLUCOSE: 142 mg/dL — AB (ref 65–99)
Globulin, Total: 2.7 g/dL (ref 1.5–4.5)
POTASSIUM: 3.9 mmol/L (ref 3.5–5.2)
Sodium: 139 mmol/L (ref 134–144)
TOTAL PROTEIN: 6.3 g/dL (ref 6.0–8.5)

## 2015-10-28 LAB — TSH+FREE T4
FREE T4: 1.13 ng/dL (ref 0.82–1.77)
TSH: 3.58 u[IU]/mL (ref 0.450–4.500)

## 2015-10-29 DIAGNOSIS — E119 Type 2 diabetes mellitus without complications: Secondary | ICD-10-CM | POA: Diagnosis not present

## 2015-11-01 ENCOUNTER — Other Ambulatory Visit: Payer: Self-pay | Admitting: Family Medicine

## 2015-11-01 MED ORDER — DOCUSATE SODIUM 100 MG PO CAPS: 100.0000 mg | ORAL_CAPSULE | Freq: Every day | ORAL | Status: AC

## 2015-11-04 ENCOUNTER — Ambulatory Visit: Payer: Commercial Managed Care - HMO | Admitting: Physician Assistant

## 2015-11-05 ENCOUNTER — Encounter: Payer: Self-pay | Admitting: Family Medicine

## 2015-12-27 ENCOUNTER — Other Ambulatory Visit: Payer: Self-pay | Admitting: Family Medicine

## 2015-12-27 DIAGNOSIS — M6281 Muscle weakness (generalized): Secondary | ICD-10-CM | POA: Diagnosis not present

## 2015-12-28 ENCOUNTER — Ambulatory Visit (INDEPENDENT_AMBULATORY_CARE_PROVIDER_SITE_OTHER): Payer: Commercial Managed Care - HMO

## 2015-12-28 ENCOUNTER — Encounter: Payer: Self-pay | Admitting: Family Medicine

## 2015-12-28 ENCOUNTER — Ambulatory Visit (INDEPENDENT_AMBULATORY_CARE_PROVIDER_SITE_OTHER): Payer: Commercial Managed Care - HMO | Admitting: Family Medicine

## 2015-12-28 VITALS — BP 111/63 | HR 76 | Temp 97.7°F | Ht 65.0 in | Wt 204.4 lb

## 2015-12-28 DIAGNOSIS — M549 Dorsalgia, unspecified: Secondary | ICD-10-CM | POA: Diagnosis not present

## 2015-12-28 DIAGNOSIS — E1142 Type 2 diabetes mellitus with diabetic polyneuropathy: Secondary | ICD-10-CM | POA: Diagnosis not present

## 2015-12-28 DIAGNOSIS — R059 Cough, unspecified: Secondary | ICD-10-CM

## 2015-12-28 DIAGNOSIS — F0280 Dementia in other diseases classified elsewhere without behavioral disturbance: Secondary | ICD-10-CM

## 2015-12-28 DIAGNOSIS — G8929 Other chronic pain: Secondary | ICD-10-CM | POA: Diagnosis not present

## 2015-12-28 DIAGNOSIS — I10 Essential (primary) hypertension: Secondary | ICD-10-CM

## 2015-12-28 DIAGNOSIS — R05 Cough: Secondary | ICD-10-CM

## 2015-12-28 DIAGNOSIS — E785 Hyperlipidemia, unspecified: Secondary | ICD-10-CM | POA: Diagnosis not present

## 2015-12-28 DIAGNOSIS — E039 Hypothyroidism, unspecified: Secondary | ICD-10-CM

## 2015-12-28 DIAGNOSIS — D509 Iron deficiency anemia, unspecified: Secondary | ICD-10-CM | POA: Diagnosis not present

## 2015-12-28 DIAGNOSIS — G309 Alzheimer's disease, unspecified: Secondary | ICD-10-CM | POA: Diagnosis not present

## 2015-12-28 MED ORDER — TRAMADOL HCL 50 MG PO TABS: 50.0000 mg | ORAL_TABLET | Freq: Four times a day (QID) | ORAL | 2 refills | Status: DC | PRN

## 2015-12-28 NOTE — Progress Notes (Signed)
Subjective:  Patient ID: [name], male    DOB: 01/23/1925  Age: 82 y.o. MRN: [id number]  CC: Hypertension (2 mth rck); Diabetes; Hypothyroidism; and Back Pain   HPI JSON [name] presents for  follow-up of hypertension. Patient has no history of headache chest pain or shortness of breath  Patient also denies symptoms of TIA such as numbness weakness lateralizing. Patient checks  blood pressure at home and has not had any elevated readings recently. Patient denies side effects from his medication. States taking it regularly.  Patient also  in for follow-up of elevated cholesterol. Doing well without complaints on current medication. Denies side effects of statin including myalgia and arthralgia and nausea. Also in today for liver function testing. Currently no chest pain, shortness of breath or other cardiovascular related symptoms noted.  Follow-up of diabetes. Patient does check blood sugar at home, eating more sweets.  Patient denies symptoms such as polyuria, polydipsia, excessive hunger, nausea No significant hypoglycemic spells noted. Medications as noted below. Taking them regularly without complication/adverse reaction being reported today.  For about a week she's had increasing cough. Daughter says he seems a bit short of breath as well. With regard to his arthritis pain he is taking tramadol occasionally averaging about once a week. They're due for refill.  History [name] has a past medical history of Aortic insufficiency; Aortic valve sclerosis; Bronchitis; CHF (congestive heart failure) ([name]); Dementia; Diabetes mellitus; Diverticulosis; Dyslipidemia; GERD (gastroesophageal reflux disease); Hard of hearing; Hemorrhoids; Hernia cerebri ([name]); History of pneumonia; Hypertension; Hypothyroidism; MI (myocardial infarction) ([name]) (1996); and Small bowel obstruction ([name]).   He has a past surgical history that includes Exploratory laparotomy w/ bowel resection; Transurethral  resection of prostate; Cholecystectomy; Colonoscopy (2008); Abdominal hernia repair; and Cataract extraction.   His family history is not on file.He reports that he quit smoking about 73 years ago. He has never used smokeless tobacco. He reports that he does not drink alcohol or use drugs.  Current Outpatient Prescriptions on File Prior to Visit  Medication Sig Dispense Refill  . albuterol (PROVENTIL) (2.5 MG/3ML) 0.083% nebulizer solution Take 3 mLs (2.5 mg total) by nebulization every 6 (six) hours as needed for wheezing or shortness of breath. 150 mL 3  . amLODipine-benazepril (LOTREL) 5-20 MG capsule TAKE (1) CAPSULE DAILY 30 capsule 5  . aspirin 81 MG tablet Take 81 mg by mouth daily.    . blood glucose meter kit and supplies KIT Dispense based on patient and insurance. Test bid. E11.9 1 each 0  . cholecalciferol (VITAMIN D) 1000 units tablet Take 1,000 Units by mouth daily.    Marland Kitchen docusate sodium (COLACE) 100 MG capsule Take 1 capsule (100 mg total) by mouth at bedtime. 10 capsule 11  . furosemide (LASIX) 40 MG tablet Take 2 tablets (80 mg total) by mouth daily. 60 tablet 5  . gabapentin (NEURONTIN) 300 MG capsule Take 1 capsule (300 mg total) by mouth at bedtime. 30 capsule 0  . glucose blood test strip Check blood sugar bid. DX- E11.9 100 each 2  . hydrOXYzine (VISTARIL) 25 MG capsule Take 1 capsule (25 mg total) by mouth every 6 (six) hours as needed for anxiety. 30 capsule 2  . JANUVIA 50 MG tablet TAKE 1 TABLET DAILY 30 tablet 2  . levothyroxine (SYNTHROID, LEVOTHROID) 88 MCG tablet Take 1 tablet (88 mcg total) by mouth daily. 30 tablet 10  . metoprolol succinate (TOPROL-XL) 25 MG 24 hr tablet Take 1 tablet (25 mg total)  by mouth daily. Take with or immediately following a meal. 30 tablet 2  . Natural Senna Laxative 64.8-324 MG TABS Take 1 tablet by mouth at bedtime.     . Omega-3 Fatty Acids (FISH OIL) 1000 MG CAPS Take 1 capsule by mouth daily.    Marland Kitchen omeprazole (PRILOSEC) 20 MG capsule  TAKE (1) CAPSULE DAILY (Patient taking differently: TAKE (1) CAPSULE DAILY AT BEDTIME) 30 capsule 5  . ondansetron (ZOFRAN) 8 MG tablet Take 1 tablet (8 mg total) by mouth every 8 (eight) hours as needed for nausea or vomiting. 20 tablet 0  . potassium chloride 20 MEQ/15ML (10%) SOLN Take 15 mLs (20 mEq total) by mouth daily. 450 mL 3  . potassium chloride SA (K-DUR,KLOR-CON) 10 MEQ tablet Take 1 tablet (10 mEq total) by mouth 2 (two) times daily. As a potassium supplement 60 tablet 2  . simvastatin (ZOCOR) 20 MG tablet TAKE 1 TABLET DAILY 30 tablet 5  . triamcinolone cream (KENALOG) 0.1 % Apply 1 application topically 2 (two) times daily. 30 g 0   No current facility-administered medications on file prior to visit.     ROS Review of Systems  Constitutional: Negative for chills, diaphoresis, fever and unexpected weight change.  HENT: Negative for congestion, hearing loss, rhinorrhea and sore throat.   Eyes: Negative for visual disturbance.  Respiratory: Positive for cough. Negative for shortness of breath.   Cardiovascular: Negative for chest pain.  Gastrointestinal: Negative for abdominal pain, constipation and diarrhea.  Genitourinary: Negative for dysuria and flank pain.  Musculoskeletal: Negative for arthralgias and joint swelling.  Skin: Negative for rash.  Neurological: Negative for dizziness and headaches.  Psychiatric/Behavioral: Negative for dysphoric mood and sleep disturbance.    Objective:  BP 111/63 (BP Location: Left Arm, Patient Position: Sitting, Cuff Size: Large)   Pulse 76   Temp 97.7 F (36.5 C) (Oral)   Ht 5' 5" (1.651 m)   Wt 204 lb 6.4 oz (92.7 kg)   SpO2 97%   BMI 34.01 kg/m   BP Readings from Last 3 Encounters:  12/28/15 111/63  10/27/15 110/62  10/17/15 130/65    Wt Readings from Last 3 Encounters:  12/28/15 204 lb 6.4 oz (92.7 kg)  10/27/15 203 lb 6.4 oz (92.3 kg)  10/17/15 220 lb (99.8 kg)     Physical Exam  Constitutional: He is oriented  to person, place, and time. He appears well-developed and well-nourished. No distress.  HENT:  Head: Normocephalic and atraumatic.  Right Ear: External ear normal.  Left Ear: External ear normal.  Nose: Nose normal.  Mouth/Throat: Oropharynx is clear and moist.  Eyes: Conjunctivae and EOM are normal. Pupils are equal, round, and reactive to light.  Neck: Normal range of motion. Neck supple. No thyromegaly present.  Cardiovascular: Normal rate, regular rhythm and normal heart sounds.   No murmur heard. Pulmonary/Chest: Effort normal. No respiratory distress. He has no wheezes. He has rales (left lower field).  Abdominal: Soft. Bowel sounds are normal. He exhibits no distension. There is no tenderness.  Musculoskeletal: Normal range of motion.  Lymphadenopathy:    He has no cervical adenopathy.  Neurological: He is alert and oriented to person, place, and time. He has normal reflexes.  Skin: Skin is warm and dry.  Psychiatric: His affect is blunt. He is slowed. Cognition and memory are impaired. He is noncommunicative.    Lab Results  Component Value Date   HGBA1C 6.8 07/05/2015   HGBA1C 7.1 01/08/2015   HGBA1C 6.8% 05/01/2014  Lab Results  Component Value Date   WBC 8.9 10/27/2015   HGB 13.1 10/17/2015   HCT 39.1 10/27/2015   PLT 214 10/27/2015   GLUCOSE 142 (H) 10/27/2015   CHOL 115 07/05/2015   TRIG 249 (H) 07/05/2015   HDL 29 (L) 07/05/2015   LDLCALC 36 07/05/2015   ALT 16 10/27/2015   AST 11 10/27/2015   NA 139 10/27/2015   K 3.9 10/27/2015   CL 98 10/27/2015   CREATININE 1.42 (H) 10/27/2015   BUN 14 10/27/2015   CO2 25 10/27/2015   TSH 3.580 10/27/2015   INR 1.7 (H) 06/25/2007   HGBA1C 6.8 07/05/2015    Ct Abdomen Pelvis Wo Contrast  Result Date: 10/17/2015 CLINICAL DATA:  Post fall hitting head, now with vomiting. EXAM: CT ABDOMEN AND PELVIS WITHOUT CONTRAST TECHNIQUE: Multidetector CT imaging of the abdomen and pelvis was performed following the standard  protocol without IV contrast. COMPARISON:  CT abdomen pelvis- 08/11/2010 FINDINGS: Lower chest: Limited visualization of the lower thorax demonstrates consolidative opacities within the imaged left lower lobe and caudal aspect of the lingula with air bronchograms (representative images 7, 10 and 11, series 10). Minimal dependent subpleural ground-glass atelectasis. Basilar reticular opacities without evidence of honeycombing. Normal heart size. Coronary artery calcifications. No pericardial effusion. Note is made of mediastinal lipomatosis. Hepatobiliary: Normal hepatic contour. There is an approximately 1.7 x 1.6 cm ill-defined hypo attenuating lesion within the central aspect of the medial segment of the left lobe of the liver (image 26, series 2) as well as a ill-defined lesion within the subcapsular aspect of the lateral segment of the left lobe of the liver measuring approximately 1.2 cm (image 26), neither of which were definitively seen on prior noncontrast examination is an are incompletely characterized on this noncontrast examination. Post cholecystectomy.  No ascites. Pancreas: Normal noncontrast appearance of the pancreas Spleen: Normal noncontrast appearance of the spleen. Note is made of several tiny splenules. Adrenals/Urinary Tract: The bilateral kidneys appear atrophic. No renal stones. No renal stones are seen along expected course of either ureter or the urinary bladder. The urinary bladder appears slightly patulous though otherwise unremarkable given degree distention. There is a minimal amount of likely age and body habitus related perinephric stranding. No urinary obstruction. There is mild thickening of the bilateral adrenal glands without discrete nodule. Stomach/Bowel: Multiple loops of nondilated large and small bowel are again noted to be contained within a wide-mouth mid/lower ventral wall hernia, similar to remote examination performed 07/2010. Ingested enteric contrast extends to the  level of the proximal sigmoid colon. Large colonic stool burden within the rectal vault and distal sigmoid colon without evidence of enteric obstruction. Scattered colonic diverticulosis without evidence of diverticulitis. Normal appearance of the terminal ileum and appendix. No pneumoperitoneum, pneumatosis or portal venous gas. Vascular/Lymphatic: Scattered eccentric calcified plaque within a normal caliber abdominal aorta. Slightly misty appearance of the midline of the abdominal mesenteries unchanged since the 07/2010 examination and likely secondary to body habitus. No bulky retroperitoneal, mesenteric, pelvic or inguinal lymphadenopathy on this noncontrast examination. Reproductive: Dystrophic calcifications within normal sized prostate gland. No free fluid in the pelvic cul-de-sac. Other: Small bilateral mesenteric fat containing inguinal hernias. Musculoskeletal: Interval development of a moderate (approximately 40%) focal concavity involving the superior endplate of the L5 vertebral body without associated definitive acute fracture line or retropulsion though new compared to the 07/2010 examination. Unchanged mild (under 25%) compression deformity involving the superior endplate of the L4 vertebral body. IMPRESSION: 1. No explanation  for patient's vomiting. Specifically, no evidence of enteric or urinary obstruction. 2. Moderate (approximately 40%) focal concavity involving the superior endplate of the L5 vertebral body without associated fracture line or retropulsion - age indeterminate though new since the 07/2010 examination. Correlation for point tenderness at this location is recommended. 3. Unchanged large wide mouth ventral abdominal wall hernia not resulting in enteric obstruction. 4. Colonic diverticulosis without evidence of diverticulitis. 5. Airspace opacities within the left lower lobe and lingula, potentially atelectasis though developing infection not excluded. Clinical correlation is  advised. 6. Potential development of ill-defined hypo attenuating hepatic lesions, incompletely characterized on this noncontrast examination though not definitely seen on prior examinations - further evaluation with nonemergent contrast-enhanced abdominal MRI is recommended for further characterization. Electronically Signed   By: Sandi Mariscal M.D.   On: 10/17/2015 08:52   Dg Chest 1 View  Result Date: 10/17/2015 CLINICAL DATA:  Altered mental status. Fell prior to arrival. Hematoma on the forehead. EXAM: CHEST 1 VIEW COMPARISON:  08/08/2015 FINDINGS: Cardiac enlargement. No pulmonary vascular congestion. Atelectasis in the left lung base. Blunting of the left costophrenic angle may be due to fluid or thickened pleura. Probable old left rib fractures. Calcified and tortuous aorta. Right lung is clear. Degenerative changes in the spine. IMPRESSION: Atelectasis with fluid or thickened pleura in the left base. Cardiac enlargement without vascular congestion or edema. Electronically Signed   By: Lucienne Capers M.D.   On: 10/17/2015 05:21   Ct Head Wo Contrast  Result Date: 10/17/2015 CLINICAL DATA:  Fall. Hematoma to the forehead and laceration to the top of the head. History of dementia. EXAM: CT HEAD WITHOUT CONTRAST CT CERVICAL SPINE WITHOUT CONTRAST TECHNIQUE: Multidetector CT imaging of the head and cervical spine was performed following the standard protocol without intravenous contrast. Multiplanar CT image reconstructions of the cervical spine were also generated. COMPARISON:  CT head 03/22/2011 FINDINGS: CT HEAD FINDINGS Left frontal subcutaneous scalp hematoma. Diffuse cerebral atrophy. Ventricular dilatation consistent with central atrophy. No mass effect or midline shift. No abnormal extra-axial fluid collections. Gray-white matter junctions are distinct. Basal cisterns are not effaced. No evidence of acute intracranial hemorrhage. No depressed skull fractures. Visualized paranasal sinuses and  mastoid air cells are not opacified. Vascular calcifications. CT CERVICAL SPINE FINDINGS Normal alignment of the cervical spine. Diffuse degenerative changes throughout the cervical spine with narrowed interspaces and endplate hypertrophic changes throughout. Degenerative changes throughout the cervical facet joints. No vertebral compression deformities. No prevertebral soft tissue swelling. C1-2 articulation appears intact. Soft tissues are unremarkable. Vascular calcifications. IMPRESSION: No acute intracranial abnormalities.  Prominent cerebral atrophy. Diffuse degenerative change throughout the cervical spine. Normal alignment. No acute displaced fractures identified. Electronically Signed   By: Lucienne Capers M.D.   On: 10/17/2015 06:20   Ct Cervical Spine Wo Contrast  Result Date: 10/17/2015 CLINICAL DATA:  Fall. Hematoma to the forehead and laceration to the top of the head. History of dementia. EXAM: CT HEAD WITHOUT CONTRAST CT CERVICAL SPINE WITHOUT CONTRAST TECHNIQUE: Multidetector CT imaging of the head and cervical spine was performed following the standard protocol without intravenous contrast. Multiplanar CT image reconstructions of the cervical spine were also generated. COMPARISON:  CT head 03/22/2011 FINDINGS: CT HEAD FINDINGS Left frontal subcutaneous scalp hematoma. Diffuse cerebral atrophy. Ventricular dilatation consistent with central atrophy. No mass effect or midline shift. No abnormal extra-axial fluid collections. Gray-white matter junctions are distinct. Basal cisterns are not effaced. No evidence of acute intracranial hemorrhage. No depressed skull fractures. Visualized paranasal  sinuses and mastoid air cells are not opacified. Vascular calcifications. CT CERVICAL SPINE FINDINGS Normal alignment of the cervical spine. Diffuse degenerative changes throughout the cervical spine with narrowed interspaces and endplate hypertrophic changes throughout. Degenerative changes throughout the  cervical facet joints. No vertebral compression deformities. No prevertebral soft tissue swelling. C1-2 articulation appears intact. Soft tissues are unremarkable. Vascular calcifications. IMPRESSION: No acute intracranial abnormalities.  Prominent cerebral atrophy. Diffuse degenerative change throughout the cervical spine. Normal alignment. No acute displaced fractures identified. Electronically Signed   By: Lucienne Capers M.D.   On: 10/17/2015 06:20    Assessment & Plan:   [place] was seen today for hypertension, diabetes, hypothyroidism and back pain.  Diagnoses and all orders for this visit:  Alzheimer's dementia  Chronic back pain  Diabetic polyneuropathy associated with type 2 diabetes mellitus ([place])  Essential hypertension  Hypothyroidism, unspecified hypothyroidism type  Iron deficiency anemia, unspecified  Hyperlipidemia  Cough -     DG Chest 2 View; Future  Other orders -     traMADol (ULTRAM) 50 MG tablet; Take 1 tablet (50 mg total) by mouth 4 (four) times daily as needed for moderate pain.   I am having Mr. Nunn maintain his aspirin, Fish Oil, NATURAL SENNA LAXATIVE, albuterol, blood glucose meter kit and supplies, glucose blood, hydrOXYzine, amLODipine-benazepril, omeprazole, simvastatin, levothyroxine, cholecalciferol, triamcinolone cream, furosemide, JANUVIA, potassium chloride, ondansetron, metoprolol succinate, potassium chloride, docusate sodium, gabapentin, and traMADol.  Meds ordered this encounter  Medications  . traMADol (ULTRAM) 50 MG tablet    Sig: Take 1 tablet (50 mg total) by mouth 4 (four) times daily as needed for moderate pain.    Dispense:  60 tablet    Refill:  2     Follow-up: No Follow-up on file.  Claretta Fraise, M.D.

## 2015-12-29 DIAGNOSIS — E119 Type 2 diabetes mellitus without complications: Secondary | ICD-10-CM | POA: Diagnosis not present

## 2015-12-30 ENCOUNTER — Telehealth: Payer: Self-pay | Admitting: Family Medicine

## 2015-12-30 NOTE — Telephone Encounter (Signed)
NA

## 2015-12-31 ENCOUNTER — Other Ambulatory Visit: Payer: Self-pay | Admitting: Family Medicine

## 2015-12-31 MED ORDER — AZITHROMYCIN 250 MG PO TABS: ORAL_TABLET | ORAL | 0 refills | Status: DC

## 2015-12-31 NOTE — Telephone Encounter (Signed)
The x-ray showed no acute pneumonia or bronchitis. There was some COPD noted. Since the cough has continued, a zpack should be useful. I sent that to the pharmacy.

## 2015-12-31 NOTE — Telephone Encounter (Signed)
Patient's daughter requesting xray results.  Patient is still having a dry cough

## 2016-01-05 ENCOUNTER — Encounter: Payer: Self-pay | Admitting: Family Medicine

## 2016-01-05 ENCOUNTER — Ambulatory Visit (INDEPENDENT_AMBULATORY_CARE_PROVIDER_SITE_OTHER): Payer: Commercial Managed Care - HMO | Admitting: Family Medicine

## 2016-01-05 VITALS — BP 120/64 | HR 70 | Temp 97.0°F | Ht 65.0 in | Wt 205.0 lb

## 2016-01-05 DIAGNOSIS — J189 Pneumonia, unspecified organism: Secondary | ICD-10-CM | POA: Diagnosis not present

## 2016-01-05 MED ORDER — LEVOFLOXACIN 500 MG PO TABS: 500.0000 mg | ORAL_TABLET | Freq: Every day | ORAL | 0 refills | Status: DC

## 2016-01-05 NOTE — Progress Notes (Signed)
Subjective:    Patient ID: [name], male    DOB: [birth year], 82 y.o.   MRN: [id number]  HPI 82 year old gentleman brought in by the family saying that he is no better. He was treated with Zithromax for presumed bronchitis but cough is getting worse and is wet according to his daughter. They are using Mucinex DM as a cough suppressant and that is effective at nighttime. They're also in using nebulizer treatments for the cough. There've been no observed wheezing. There is been no fever or chills. Patient is nonverbal and cannot really tell us how he feels.  Patient Active Problem List   Diagnosis Date Noted  . Eczema 08/31/2015  . Ascites 08/31/2015  . Right hip pain 08/10/2015  . Diabetic polyneuropathy associated with type 2 diabetes mellitus ([place]) 08/10/2015  . Alzheimer's dementia 08/10/2015  . Hypothyroid 12/04/2012  . Chronic back pain 12/04/2012  . Hard of hearing 09/03/2012  . Aortic insufficiency 09/03/2012  . Vitamin D deficiency 09/03/2012  . Arthritis 09/03/2012  . Insomnia 09/03/2012  . Iron deficiency anemia, unspecified 07/31/2011  . Hyperlipidemia 11/30/2009  . Essential hypertension 11/30/2009  . CORONARY ATHEROSCLEROSIS NATIVE CORONARY ARTERY 11/30/2009   Outpatient Encounter Prescriptions as of 01/05/2016  Medication Sig  . albuterol (PROVENTIL) (2.5 MG/3ML) 0.083% nebulizer solution Take 3 mLs (2.5 mg total) by nebulization every 6 (six) hours as needed for wheezing or shortness of breath.  Marland Kitchen amLODipine-benazepril (LOTREL) 5-20 MG capsule TAKE (1) CAPSULE DAILY  . aspirin 81 MG tablet Take 81 mg by mouth daily.  . blood glucose meter kit and supplies KIT Dispense based on patient and insurance. Test bid. E11.9  . cholecalciferol (VITAMIN D) 1000 units tablet Take 1,000 Units by mouth daily.  Marland Kitchen docusate sodium (COLACE) 100 MG capsule Take 1 capsule (100 mg total) by mouth at bedtime.  . furosemide (LASIX) 40 MG tablet Take 2 tablets (80 mg total) by mouth  daily.  Marland Kitchen gabapentin (NEURONTIN) 300 MG capsule Take 1 capsule (300 mg total) by mouth at bedtime.  Marland Kitchen glucose blood test strip Check blood sugar bid. DX- E11.9  . hydrOXYzine (VISTARIL) 25 MG capsule Take 1 capsule (25 mg total) by mouth every 6 (six) hours as needed for anxiety.  Marland Kitchen JANUVIA 50 MG tablet TAKE 1 TABLET DAILY  . levothyroxine (SYNTHROID, LEVOTHROID) 88 MCG tablet Take 1 tablet (88 mcg total) by mouth daily.  . metoprolol succinate (TOPROL-XL) 25 MG 24 hr tablet Take 1 tablet (25 mg total) by mouth daily. Take with or immediately following a meal.  . Natural Senna Laxative 64.8-324 MG TABS Take 1 tablet by mouth at bedtime.   . Omega-3 Fatty Acids (FISH OIL) 1000 MG CAPS Take 1 capsule by mouth daily.  Marland Kitchen omeprazole (PRILOSEC) 20 MG capsule TAKE (1) CAPSULE DAILY (Patient taking differently: TAKE (1) CAPSULE DAILY AT BEDTIME)  . ondansetron (ZOFRAN) 8 MG tablet Take 1 tablet (8 mg total) by mouth every 8 (eight) hours as needed for nausea or vomiting.  . potassium chloride 20 MEQ/15ML (10%) SOLN Take 15 mLs (20 mEq total) by mouth daily.  . potassium chloride SA (K-DUR,KLOR-CON) 10 MEQ tablet Take 1 tablet (10 mEq total) by mouth 2 (two) times daily. As a potassium supplement  . simvastatin (ZOCOR) 20 MG tablet TAKE 1 TABLET DAILY  . traMADol (ULTRAM) 50 MG tablet Take 1 tablet (50 mg total) by mouth 4 (four) times daily as needed for moderate pain.  Marland Kitchen triamcinolone cream (KENALOG) 0.1 %  Apply 1 application topically 2 (two) times daily.  . [DISCONTINUED] azithromycin (ZITHROMAX Z-PAK) 250 MG tablet Take two right away Then one a day for the next 4 days.   No facility-administered encounter medications on file as of 01/05/2016.       Review of Systems  Constitutional: Negative.   HENT: Positive for congestion.   Eyes: Negative.   Respiratory: Positive for cough.   Cardiovascular: Negative.   Neurological: Negative.   Psychiatric/Behavioral: Positive for decreased concentration.        Objective:   Physical Exam  Constitutional: He appears well-developed and well-nourished.  Cardiovascular: Normal rate, regular rhythm and normal heart sounds.   Pulmonary/Chest: He has rales.  Neurological: He is alert.  Psychiatric: He has a normal mood and affect.   BP 120/64 (BP Location: Right Arm, Patient Position: Sitting, Cuff Size: Normal)   Pulse 70   Temp 97 F (36.1 C) (Oral)   Ht 5' 5"  (1.651 m)   Wt 205 lb (93 kg)   BMI 34.11 kg/m         Assessment & Plan:  1. CAP (community acquired pneumonia) Clinically, I hear rales which are consistent with pneumonia. I did not repeat chest x-ray. I do not hear wheezes as might be expected with bronchitis. There is good air movement bilaterally. Patient has not clinically dyspneic and color is good. The azithromycin is probably still present and his body since it was just finished yesterday but I will go ahead and add Levaquin 500 mg once a day. Encourage fluids (although this is difficult). Take Mucinex DM for cough suppression. Call or return if symptoms are not improving  Wardell Honour MD

## 2016-01-07 ENCOUNTER — Other Ambulatory Visit: Payer: Self-pay | Admitting: Family Medicine

## 2016-01-11 NOTE — Telephone Encounter (Signed)
Patient followed up on 08/09 with Sabra Heck

## 2016-01-17 ENCOUNTER — Other Ambulatory Visit: Payer: Self-pay | Admitting: Family Medicine

## 2016-01-24 ENCOUNTER — Other Ambulatory Visit: Payer: Self-pay | Admitting: Family Medicine

## 2016-01-27 ENCOUNTER — Other Ambulatory Visit: Payer: Self-pay | Admitting: Family Medicine

## 2016-01-27 ENCOUNTER — Ambulatory Visit (INDEPENDENT_AMBULATORY_CARE_PROVIDER_SITE_OTHER): Payer: Commercial Managed Care - HMO | Admitting: Family Medicine

## 2016-01-27 DIAGNOSIS — M6281 Muscle weakness (generalized): Secondary | ICD-10-CM | POA: Diagnosis not present

## 2016-01-27 DIAGNOSIS — E039 Hypothyroidism, unspecified: Secondary | ICD-10-CM

## 2016-01-27 DIAGNOSIS — E785 Hyperlipidemia, unspecified: Secondary | ICD-10-CM

## 2016-01-31 DIAGNOSIS — E119 Type 2 diabetes mellitus without complications: Secondary | ICD-10-CM | POA: Diagnosis not present

## 2016-02-01 DIAGNOSIS — E1151 Type 2 diabetes mellitus with diabetic peripheral angiopathy without gangrene: Secondary | ICD-10-CM | POA: Diagnosis not present

## 2016-02-01 DIAGNOSIS — B351 Tinea unguium: Secondary | ICD-10-CM | POA: Diagnosis not present

## 2016-02-01 DIAGNOSIS — L84 Corns and callosities: Secondary | ICD-10-CM | POA: Diagnosis not present

## 2016-02-18 ENCOUNTER — Other Ambulatory Visit: Payer: Self-pay | Admitting: Family Medicine

## 2016-02-18 DIAGNOSIS — R188 Other ascites: Secondary | ICD-10-CM

## 2016-02-24 ENCOUNTER — Other Ambulatory Visit: Payer: Self-pay | Admitting: Family Medicine

## 2016-02-25 DIAGNOSIS — M6281 Muscle weakness (generalized): Secondary | ICD-10-CM | POA: Diagnosis not present

## 2016-02-29 DIAGNOSIS — E119 Type 2 diabetes mellitus without complications: Secondary | ICD-10-CM | POA: Diagnosis not present

## 2016-03-02 ENCOUNTER — Ambulatory Visit (INDEPENDENT_AMBULATORY_CARE_PROVIDER_SITE_OTHER): Payer: Commercial Managed Care - HMO

## 2016-03-02 DIAGNOSIS — Z23 Encounter for immunization: Secondary | ICD-10-CM

## 2016-03-20 ENCOUNTER — Other Ambulatory Visit: Payer: Self-pay | Admitting: Family Medicine

## 2016-03-20 DIAGNOSIS — R188 Other ascites: Secondary | ICD-10-CM

## 2016-03-24 ENCOUNTER — Other Ambulatory Visit: Payer: Self-pay | Admitting: Family Medicine

## 2016-03-28 ENCOUNTER — Ambulatory Visit (INDEPENDENT_AMBULATORY_CARE_PROVIDER_SITE_OTHER): Payer: Commercial Managed Care - HMO | Admitting: Family Medicine

## 2016-03-28 DIAGNOSIS — E039 Hypothyroidism, unspecified: Secondary | ICD-10-CM | POA: Diagnosis not present

## 2016-03-28 DIAGNOSIS — E785 Hyperlipidemia, unspecified: Secondary | ICD-10-CM

## 2016-03-28 DIAGNOSIS — M6281 Muscle weakness (generalized): Secondary | ICD-10-CM

## 2016-03-30 DIAGNOSIS — E119 Type 2 diabetes mellitus without complications: Secondary | ICD-10-CM | POA: Diagnosis not present

## 2016-04-05 ENCOUNTER — Other Ambulatory Visit: Payer: Self-pay | Admitting: Family Medicine

## 2016-04-25 DIAGNOSIS — M6281 Muscle weakness (generalized): Secondary | ICD-10-CM | POA: Diagnosis not present

## 2016-04-26 ENCOUNTER — Other Ambulatory Visit: Payer: Self-pay | Admitting: Family Medicine

## 2016-04-28 ENCOUNTER — Encounter: Payer: Self-pay | Admitting: Family Medicine

## 2016-04-28 ENCOUNTER — Ambulatory Visit (INDEPENDENT_AMBULATORY_CARE_PROVIDER_SITE_OTHER): Payer: Commercial Managed Care - HMO | Admitting: Family Medicine

## 2016-04-28 VITALS — BP 99/57 | HR 70 | Temp 98.0°F | Ht 65.0 in | Wt 208.1 lb

## 2016-04-28 DIAGNOSIS — E119 Type 2 diabetes mellitus without complications: Secondary | ICD-10-CM | POA: Diagnosis not present

## 2016-04-28 DIAGNOSIS — J209 Acute bronchitis, unspecified: Secondary | ICD-10-CM | POA: Diagnosis not present

## 2016-04-28 MED ORDER — FLUTICASONE PROPIONATE 50 MCG/ACT NA SUSP: 1.0000 | Freq: Two times a day (BID) | NASAL | 6 refills | Status: DC | PRN

## 2016-04-28 MED ORDER — DOXYCYCLINE MONOHYDRATE 100 MG PO TABS: 100.0000 mg | ORAL_TABLET | Freq: Two times a day (BID) | ORAL | 0 refills | Status: DC

## 2016-04-28 NOTE — Progress Notes (Signed)
BP (!) 99/57   Pulse 70   Temp 98 F (36.7 C) (Oral)   Ht 5\' 5"  (1.651 m)   Wt 208 lb 2 oz (94.4 kg)   BMI 34.63 kg/m    Subjective:    Patient ID: [name], male    DOB: Aug 03, 1924, 82 y.o.   MRN: [id number]  HPI: [name] is a 82 y.o. male presenting on 04/28/2016 for Cough (has had for a while but has gotten worse last night)   HPI Cough and congestion Patient has been having cough and congestion that is been going on for the past couple weeks but has worsened over the past few days. He is coughing a lot especially at night. He has not had any shortness of breath or fevers or chills. The cough is mostly nonproductive. His wife had a similar illness previous and was treated with an antibiotic to get it better.  Relevant past medical, surgical, family and social history reviewed and updated as indicated. Interim medical history since our last visit reviewed. Allergies and medications reviewed and updated.  Review of Systems  Constitutional: Negative for chills and fever.  HENT: Positive for congestion, postnasal drip, rhinorrhea, sinus pressure, sneezing and sore throat. Negative for ear discharge, ear pain and voice change.   Eyes: Negative for pain, discharge, redness and visual disturbance.  Respiratory: Positive for cough and wheezing. Negative for chest tightness and shortness of breath.   Cardiovascular: Negative for chest pain and leg swelling.  Musculoskeletal: Negative for gait problem.  Skin: Negative for rash.  All other systems reviewed and are negative.   Per HPI unless specifically indicated above      Objective:    BP (!) 99/57   Pulse 70   Temp 98 F (36.7 C) (Oral)   Ht 5\' 5"  (1.651 m)   Wt 208 lb 2 oz (94.4 kg)   BMI 34.63 kg/m   Wt Readings from Last 3 Encounters:  04/28/16 208 lb 2 oz (94.4 kg)  01/05/16 205 lb (93 kg)  12/28/15 204 lb 6.4 oz (92.7 kg)    Physical Exam  Constitutional: He is oriented to person, place, and  time. He appears well-developed and well-nourished. No distress.  HENT:  Right Ear: Tympanic membrane, external ear and ear canal normal.  Left Ear: Tympanic membrane, external ear and ear canal normal.  Nose: Mucosal edema and rhinorrhea present. No sinus tenderness. No epistaxis. Right sinus exhibits maxillary sinus tenderness. Right sinus exhibits no frontal sinus tenderness. Left sinus exhibits maxillary sinus tenderness. Left sinus exhibits no frontal sinus tenderness.  Mouth/Throat: Uvula is midline and mucous membranes are normal. Posterior oropharyngeal edema and posterior oropharyngeal erythema present. No oropharyngeal exudate or tonsillar abscesses.  Eyes: Conjunctivae are normal. Right eye exhibits no discharge. Left eye exhibits no discharge. No scleral icterus.  Cardiovascular: Normal rate, regular rhythm, normal heart sounds and intact distal pulses.   No murmur heard. Pulmonary/Chest: Effort normal and breath sounds normal. No respiratory distress. He has no wheezes. He has no rales.  Musculoskeletal: Normal range of motion. He exhibits no edema.  Neurological: He is alert and oriented to person, place, and time. Coordination normal.  Skin: Skin is warm and dry. No rash noted. He is not diaphoretic.  Psychiatric: He has a normal mood and affect. His behavior is normal.  Nursing note and vitals reviewed.     Assessment & Plan:   Problem List Items Addressed This Visit    None  Visit Diagnoses    Acute bronchitis, unspecified organism    -  Primary   Relevant Medications   doxycycline (ADOXA) 100 MG tablet       Follow up plan: Return if symptoms worsen or fail to improve.  Counseling provided for all of the vaccine components No orders of the defined types were placed in this encounter.   Caryl Pina, MD [facility] 04/28/2016, 11:40 AM

## 2016-05-02 DIAGNOSIS — E1151 Type 2 diabetes mellitus with diabetic peripheral angiopathy without gangrene: Secondary | ICD-10-CM | POA: Diagnosis not present

## 2016-05-02 DIAGNOSIS — L84 Corns and callosities: Secondary | ICD-10-CM | POA: Diagnosis not present

## 2016-05-02 DIAGNOSIS — B351 Tinea unguium: Secondary | ICD-10-CM | POA: Diagnosis not present

## 2016-05-11 ENCOUNTER — Other Ambulatory Visit: Payer: Self-pay | Admitting: Family Medicine

## 2016-05-16 ENCOUNTER — Other Ambulatory Visit: Payer: Self-pay | Admitting: Family Medicine

## 2016-05-30 DIAGNOSIS — E119 Type 2 diabetes mellitus without complications: Secondary | ICD-10-CM | POA: Diagnosis not present

## 2016-06-02 ENCOUNTER — Other Ambulatory Visit: Payer: Self-pay | Admitting: Family Medicine

## 2016-06-16 ENCOUNTER — Other Ambulatory Visit: Payer: Self-pay | Admitting: Family Medicine

## 2016-06-20 DIAGNOSIS — E119 Type 2 diabetes mellitus without complications: Secondary | ICD-10-CM | POA: Diagnosis not present

## 2016-06-20 DIAGNOSIS — L03031 Cellulitis of right toe: Secondary | ICD-10-CM | POA: Diagnosis not present

## 2016-06-22 DIAGNOSIS — E785 Hyperlipidemia, unspecified: Secondary | ICD-10-CM | POA: Diagnosis not present

## 2016-06-22 DIAGNOSIS — E039 Hypothyroidism, unspecified: Secondary | ICD-10-CM | POA: Diagnosis not present

## 2016-06-22 DIAGNOSIS — M6281 Muscle weakness (generalized): Secondary | ICD-10-CM | POA: Diagnosis not present

## 2016-06-30 DIAGNOSIS — E119 Type 2 diabetes mellitus without complications: Secondary | ICD-10-CM | POA: Diagnosis not present

## 2016-07-06 ENCOUNTER — Other Ambulatory Visit: Payer: Self-pay | Admitting: Family Medicine

## 2016-07-07 ENCOUNTER — Other Ambulatory Visit: Payer: Self-pay | Admitting: Family Medicine

## 2016-07-17 ENCOUNTER — Other Ambulatory Visit: Payer: Self-pay | Admitting: Family Medicine

## 2016-07-17 DIAGNOSIS — R188 Other ascites: Secondary | ICD-10-CM

## 2016-07-19 ENCOUNTER — Ambulatory Visit (INDEPENDENT_AMBULATORY_CARE_PROVIDER_SITE_OTHER): Payer: Medicare HMO | Admitting: Physician Assistant

## 2016-07-19 ENCOUNTER — Encounter: Payer: Self-pay | Admitting: Physician Assistant

## 2016-07-19 DIAGNOSIS — J209 Acute bronchitis, unspecified: Secondary | ICD-10-CM

## 2016-07-19 MED ORDER — MECLIZINE HCL 25 MG PO TABS: 25.0000 mg | ORAL_TABLET | Freq: Three times a day (TID) | ORAL | 0 refills | Status: AC | PRN

## 2016-07-19 MED ORDER — DOXYCYCLINE MONOHYDRATE 100 MG PO TABS: 100.0000 mg | ORAL_TABLET | Freq: Two times a day (BID) | ORAL | 0 refills | Status: DC

## 2016-07-19 NOTE — Progress Notes (Signed)
BP 115/66   Pulse 85   Temp 99.8 F (37.7 C) (Oral)   Ht 5' 5"  (1.651 m)   Wt 209 lb 6.4 oz (95 kg)   BMI 34.85 kg/m    Subjective:    Patient ID: [name], male    DOB: December 07, 1924, 83 y.o.   MRN: [id number]  HPI: [name] is a 83 y.o. male presenting on 07/19/2016 for Cough  Patient with several days of progressing upper respiratory and bronchial symptoms. Initially there was more upper respiratory congestion. This progressed to having significant cough that is productive throughout the day and severe at night. There is occasional wheezing after coughing. They will sometimes have slight dyspnea on exertion. It is productive mucus that is yellow in color. Denies any blood.   Relevant past medical, surgical, family and social history reviewed and updated as indicated. Allergies and medications reviewed and updated.  Past Medical History:  Diagnosis Date  . Aortic insufficiency   . Aortic valve sclerosis   . Bronchitis   . CHF (congestive heart failure) ([place])   . Dementia   . Diabetes mellitus    Diet control   . Diverticulosis   . Dyslipidemia   . GERD (gastroesophageal reflux disease)   . Hard of hearing   . Hemorrhoids   . Hernia cerebri ([place])   . History of pneumonia   . Hypertension   . Hypothyroidism   . MI (myocardial infarction) 1996   percutaneous coronary intervention   . Small bowel obstruction     Past Surgical History:  Procedure Laterality Date  . ABDOMINAL HERNIA REPAIR     multiple sx's  . CATARACT EXTRACTION     left eye  . CHOLECYSTECTOMY    . COLONOSCOPY  2008  . EXPLORATORY LAPAROTOMY W/ BOWEL RESECTION     ventral hernia repair  . TRANSURETHRAL RESECTION OF PROSTATE      Review of Systems  Constitutional: Positive for fatigue. Negative for appetite change.  HENT: Positive for congestion, sinus pressure and sore throat.   Eyes: Negative.  Negative for pain and visual disturbance.  Respiratory: Positive for cough, shortness  of breath and wheezing. Negative for chest tightness.   Cardiovascular: Negative.  Negative for chest pain, palpitations and leg swelling.  Gastrointestinal: Negative.  Negative for abdominal pain, diarrhea, nausea and vomiting.  Endocrine: Negative.   Genitourinary: Negative.   Musculoskeletal: Positive for back pain and myalgias.  Skin: Negative.  Negative for color change and rash.  Neurological: Positive for headaches. Negative for weakness and numbness.  Psychiatric/Behavioral: Negative.     Allergies as of 07/19/2016   No Known Allergies     Medication List       Accurate as of 07/19/16 10:03 PM. Always use your most recent med list.          albuterol (2.5 MG/3ML) 0.083% nebulizer solution Commonly known as:  PROVENTIL Take 3 mLs (2.5 mg total) by nebulization every 6 (six) hours as needed for wheezing or shortness of breath.   amLODipine-benazepril 5-20 MG capsule Commonly known as:  LOTREL TAKE (1) CAPSULE DAILY   aspirin 81 MG tablet Take 81 mg by mouth daily.   blood glucose meter kit and supplies Kit Dispense based on patient and insurance. Test bid. E11.9   cholecalciferol 1000 units tablet Commonly known as:  VITAMIN D Take 1,000 Units by mouth daily.   docusate sodium 100 MG capsule Commonly known as:  COLACE Take 1 capsule (100 mg  total) by mouth at bedtime.   doxycycline 100 MG tablet Commonly known as:  ADOXA Take 1 tablet (100 mg total) by mouth 2 (two) times daily.   Fish Oil 1000 MG Caps Take 1 capsule by mouth daily.   fluticasone 50 MCG/ACT nasal spray Commonly known as:  FLONASE Place 1 spray into both nostrils 2 (two) times daily as needed for allergies or rhinitis.   furosemide 40 MG tablet Commonly known as:  LASIX TAKE (2) TABLETS DAILY   gabapentin 300 MG capsule Commonly known as:  NEURONTIN Take 1 capsule (300 mg total) by mouth at bedtime.   glucose blood test strip Check blood sugar bid. DX- E11.9   hydrOXYzine 25 MG  capsule Commonly known as:  VISTARIL Take 1 capsule (25 mg total) by mouth every 6 (six) hours as needed for anxiety.   JANUVIA 50 MG tablet Generic drug:  sitaGLIPtin TAKE 1 TABLET DAILY   levothyroxine 88 MCG tablet Commonly known as:  SYNTHROID, LEVOTHROID Take 1 tablet (88 mcg total) by mouth daily.   meclizine 25 MG tablet Commonly known as:  ANTIVERT Take 1 tablet (25 mg total) by mouth 3 (three) times daily as needed for dizziness.   metoprolol succinate 25 MG 24 hr tablet Commonly known as:  TOPROL-XL Take 1 tablet daily. Take with or immediately following a meal.   NATURAL SENNA LAXATIVE 64.8-324 MG Tabs Take 1 tablet by mouth at bedtime.   omeprazole 20 MG capsule Commonly known as:  PRILOSEC TAKE (1) CAPSULE DAILY   ondansetron 8 MG tablet Commonly known as:  ZOFRAN Take 1 tablet (8 mg total) by mouth every 8 (eight) hours as needed for nausea or vomiting.   potassium chloride 10 MEQ tablet Commonly known as:  K-DUR,KLOR-CON Take 1 tablet 2 (two) times daily. As a potassium supplement   simvastatin 20 MG tablet Commonly known as:  ZOCOR TAKE 1 TABLET DAILY   traMADol 50 MG tablet Commonly known as:  ULTRAM Take 1 tablet (50 mg total) by mouth 4 (four) times daily as needed for moderate pain.   triamcinolone cream 0.1 % Commonly known as:  KENALOG Apply 1 application topically 2 (two) times daily.          Objective:    BP 115/66   Pulse 85   Temp 99.8 F (37.7 C) (Oral)   Ht 5' 5"  (1.651 m)   Wt 209 lb 6.4 oz (95 kg)   BMI 34.85 kg/m   No Known Allergies  Physical Exam  Constitutional: He appears well-developed and well-nourished.  HENT:  Head: Normocephalic and atraumatic.  Right Ear: Hearing and tympanic membrane normal.  Left Ear: Hearing and tympanic membrane normal.  Nose: Mucosal edema and sinus tenderness present. No nasal deformity. Right sinus exhibits frontal sinus tenderness. Left sinus exhibits frontal sinus tenderness.    Mouth/Throat: Posterior oropharyngeal erythema present.  Eyes: Conjunctivae and EOM are normal. Pupils are equal, round, and reactive to light. Right eye exhibits no discharge. Left eye exhibits no discharge.  Neck: Normal range of motion. Neck supple.  Cardiovascular: Normal rate, regular rhythm and normal heart sounds.   Pulmonary/Chest: Effort normal. No respiratory distress. He has no decreased breath sounds. He has wheezes. He has no rhonchi. He has no rales.  Abdominal: Soft. Bowel sounds are normal.  Musculoskeletal: Normal range of motion.  Skin: Skin is warm and dry.        Assessment & Plan:   1. Acute bronchitis, unspecified organism - doxycycline (  ADOXA) 100 MG tablet; Take 1 tablet (100 mg total) by mouth 2 (two) times daily.  Dispense: 20 tablet; Refill: 0 - meclizine (ANTIVERT) 25 MG tablet; Take 1 tablet (25 mg total) by mouth 3 (three) times daily as needed for dizziness.  Dispense: 30 tablet; Refill: 0   Continue all other maintenance medications as listed above.  Follow up plan: Return if symptoms worsen or fail to improve.  Educational handout given for bronchitis  Terald Sleeper PA-C [facility] [address]  [place], [place] [address] [phone]   07/19/2016, 10:03 PM

## 2016-07-19 NOTE — Patient Instructions (Signed)

## 2016-07-21 ENCOUNTER — Telehealth: Payer: Self-pay | Admitting: Family Medicine

## 2016-07-21 DIAGNOSIS — M549 Dorsalgia, unspecified: Secondary | ICD-10-CM

## 2016-07-21 DIAGNOSIS — G309 Alzheimer's disease, unspecified: Principal | ICD-10-CM

## 2016-07-21 DIAGNOSIS — F0280 Dementia in other diseases classified elsewhere without behavioral disturbance: Secondary | ICD-10-CM

## 2016-07-21 DIAGNOSIS — E1142 Type 2 diabetes mellitus with diabetic polyneuropathy: Secondary | ICD-10-CM

## 2016-07-21 NOTE — Telephone Encounter (Signed)
Daughter wants rx for wheelchair he has fell twice and can barely walk wants it sent to [facility] in [place].

## 2016-07-21 NOTE — Telephone Encounter (Signed)
Patient's daughter aware that script will be placed up front this afternoon for pickup

## 2016-07-24 ENCOUNTER — Encounter: Payer: Self-pay | Admitting: Family Medicine

## 2016-07-24 ENCOUNTER — Ambulatory Visit (INDEPENDENT_AMBULATORY_CARE_PROVIDER_SITE_OTHER): Payer: Medicare HMO

## 2016-07-24 ENCOUNTER — Ambulatory Visit (INDEPENDENT_AMBULATORY_CARE_PROVIDER_SITE_OTHER): Payer: Medicare HMO | Admitting: Family Medicine

## 2016-07-24 VITALS — BP 88/49 | HR 75 | Temp 99.0°F | Ht 65.0 in | Wt 208.0 lb

## 2016-07-24 DIAGNOSIS — R0602 Shortness of breath: Secondary | ICD-10-CM

## 2016-07-24 DIAGNOSIS — F0280 Dementia in other diseases classified elsewhere without behavioral disturbance: Secondary | ICD-10-CM

## 2016-07-24 DIAGNOSIS — J209 Acute bronchitis, unspecified: Secondary | ICD-10-CM

## 2016-07-24 DIAGNOSIS — G301 Alzheimer's disease with late onset: Secondary | ICD-10-CM

## 2016-07-24 MED ORDER — LEVOFLOXACIN 250 MG PO TABS: 250.0000 mg | ORAL_TABLET | Freq: Every day | ORAL | 0 refills | Status: DC

## 2016-07-24 MED ORDER — HYDROCODONE-HOMATROPINE 5-1.5 MG/5ML PO SYRP: 5.0000 mL | ORAL_SOLUTION | Freq: Four times a day (QID) | ORAL | 0 refills | Status: DC | PRN

## 2016-07-24 MED ORDER — LEVOFLOXACIN 250 MG PO TABS: 500.0000 mg | ORAL_TABLET | Freq: Every day | ORAL | 0 refills | Status: DC

## 2016-07-24 NOTE — Progress Notes (Signed)
Subjective:  Patient ID: [name], male    DOB: [birth year]  Age: 83 y.o. MRN: [id number]  CC: Cough (pt here today c/o cough, was seen 07/19/16 with Glenard Haring and dx'd with bronchitis and still on doxy, his cough is really bad at night.)   HPI [name] presents for Severe cough that seems to be worse in the early mornings. He's been on doxycycline now for about 5 days and it is not improved. He does seem to be more dyspneic than usual states wife and daughter. He's not had fever. He is not able to communicate well so it's hard to determine if he's any pain. The cough has not been productive. No fever has been noted at home. This in spite of temperature 90.9 degrees here.   History Ishaq has a past medical history of Aortic insufficiency; Aortic valve sclerosis; Bronchitis; CHF (congestive heart failure) ([place]); Dementia; Diabetes mellitus; Diverticulosis; Dyslipidemia; GERD (gastroesophageal reflux disease); Hard of hearing; Hemorrhoids; Hernia cerebri ([place]); History of pneumonia; Hypertension; Hypothyroidism; MI (myocardial infarction) (1996); and Small bowel obstruction.   He has a past surgical history that includes Exploratory laparotomy w/ bowel resection; Transurethral resection of prostate; Cholecystectomy; Colonoscopy (2008); Abdominal hernia repair; and Cataract extraction.   His family history is not on file.He reports that he quit smoking about 74 years ago. He has never used smokeless tobacco. He reports that he does not drink alcohol or use drugs.    ROS Review of Systems  Unable to perform ROS: Dementia    Objective:  BP (!) 88/49   Pulse 75   Temp 99 F (37.2 C) (Oral)   Ht 5' 5"  (1.651 m)   Wt 208 lb (94.3 kg)   BMI 34.61 kg/m   BP Readings from Last 3 Encounters:  07/24/16 (!) 88/49  07/19/16 115/66  04/28/16 (!) 99/57    Wt Readings from Last 3 Encounters:  07/24/16 208 lb (94.3 kg)  07/19/16 209 lb 6.4 oz (95 kg)  04/28/16 208 lb 2 oz (94.4 kg)       Physical Exam  Constitutional: He appears well-developed and well-nourished.  HENT:  Head: Normocephalic and atraumatic.  Right Ear: Tympanic membrane and external ear normal. No decreased hearing is noted.  Left Ear: Tympanic membrane and external ear normal. No decreased hearing is noted.  Nose: Mucosal edema present. Right sinus exhibits no frontal sinus tenderness. Left sinus exhibits no frontal sinus tenderness.  Mouth/Throat: No oropharyngeal exudate or posterior oropharyngeal erythema.  Eyes: Pupils are equal, round, and reactive to light.  Neck: Normal range of motion. Neck supple. No Brudzinski's sign noted.  Cardiovascular: Normal rate and regular rhythm.   No murmur heard. Pulmonary/Chest: No respiratory distress. He has wheezes (scattered, exp.).  Abdominal: Soft. Bowel sounds are normal. He exhibits no mass. There is no tenderness.  Lymphadenopathy:       Head (right side): No preauricular adenopathy present.       Head (left side): No preauricular adenopathy present.       Right cervical: No superficial cervical adenopathy present.      Left cervical: No superficial cervical adenopathy present.  Vitals reviewed.   Ct Abdomen Pelvis Wo Contrast  Result Date: 10/17/2015 CLINICAL DATA:  Post fall hitting head, now with vomiting. EXAM: CT ABDOMEN AND PELVIS WITHOUT CONTRAST TECHNIQUE: Multidetector CT imaging of the abdomen and pelvis was performed following the standard protocol without IV contrast. COMPARISON:  CT abdomen pelvis- 08/11/2010 FINDINGS: Lower chest: Limited visualization of  the lower thorax demonstrates consolidative opacities within the imaged left lower lobe and caudal aspect of the lingula with air bronchograms (representative images 7, 10 and 11, series 10). Minimal dependent subpleural ground-glass atelectasis. Basilar reticular opacities without evidence of honeycombing. Normal heart size. Coronary artery calcifications. No pericardial effusion. Note is  made of mediastinal lipomatosis. Hepatobiliary: Normal hepatic contour. There is an approximately 1.7 x 1.6 cm ill-defined hypo attenuating lesion within the central aspect of the medial segment of the left lobe of the liver (image 26, series 2) as well as a ill-defined lesion within the subcapsular aspect of the lateral segment of the left lobe of the liver measuring approximately 1.2 cm (image 26), neither of which were definitively seen on prior noncontrast examination is an are incompletely characterized on this noncontrast examination. Post cholecystectomy.  No ascites. Pancreas: Normal noncontrast appearance of the pancreas Spleen: Normal noncontrast appearance of the spleen. Note is made of several tiny splenules. Adrenals/Urinary Tract: The bilateral kidneys appear atrophic. No renal stones. No renal stones are seen along expected course of either ureter or the urinary bladder. The urinary bladder appears slightly patulous though otherwise unremarkable given degree distention. There is a minimal amount of likely age and body habitus related perinephric stranding. No urinary obstruction. There is mild thickening of the bilateral adrenal glands without discrete nodule. Stomach/Bowel: Multiple loops of nondilated large and small bowel are again noted to be contained within a wide-mouth mid/lower ventral wall hernia, similar to remote examination performed 07/2010. Ingested enteric contrast extends to the level of the proximal sigmoid colon. Large colonic stool burden within the rectal vault and distal sigmoid colon without evidence of enteric obstruction. Scattered colonic diverticulosis without evidence of diverticulitis. Normal appearance of the terminal ileum and appendix. No pneumoperitoneum, pneumatosis or portal venous gas. Vascular/Lymphatic: Scattered eccentric calcified plaque within a normal caliber abdominal aorta. Slightly misty appearance of the midline of the abdominal mesenteries unchanged since  the 07/2010 examination and likely secondary to body habitus. No bulky retroperitoneal, mesenteric, pelvic or inguinal lymphadenopathy on this noncontrast examination. Reproductive: Dystrophic calcifications within normal sized prostate gland. No free fluid in the pelvic cul-de-sac. Other: Small bilateral mesenteric fat containing inguinal hernias. Musculoskeletal: Interval development of a moderate (approximately 40%) focal concavity involving the superior endplate of the L5 vertebral body without associated definitive acute fracture line or retropulsion though new compared to the 07/2010 examination. Unchanged mild (under 25%) compression deformity involving the superior endplate of the L4 vertebral body. IMPRESSION: 1. No explanation for patient's vomiting. Specifically, no evidence of enteric or urinary obstruction. 2. Moderate (approximately 40%) focal concavity involving the superior endplate of the L5 vertebral body without associated fracture line or retropulsion - age indeterminate though new since the 07/2010 examination. Correlation for point tenderness at this location is recommended. 3. Unchanged large wide mouth ventral abdominal wall hernia not resulting in enteric obstruction. 4. Colonic diverticulosis without evidence of diverticulitis. 5. Airspace opacities within the left lower lobe and lingula, potentially atelectasis though developing infection not excluded. Clinical correlation is advised. 6. Potential development of ill-defined hypo attenuating hepatic lesions, incompletely characterized on this noncontrast examination though not definitely seen on prior examinations - further evaluation with nonemergent contrast-enhanced abdominal MRI is recommended for further characterization. Electronically Signed   By: Sandi Mariscal M.D.   On: 10/17/2015 08:52   Dg Chest 1 View  Result Date: 10/17/2015 CLINICAL DATA:  Altered mental status. Fell prior to arrival. Hematoma on the forehead. EXAM: CHEST 1  VIEW  COMPARISON:  08/08/2015 FINDINGS: Cardiac enlargement. No pulmonary vascular congestion. Atelectasis in the left lung base. Blunting of the left costophrenic angle may be due to fluid or thickened pleura. Probable old left rib fractures. Calcified and tortuous aorta. Right lung is clear. Degenerative changes in the spine. IMPRESSION: Atelectasis with fluid or thickened pleura in the left base. Cardiac enlargement without vascular congestion or edema. Electronically Signed   By: Lucienne Capers M.D.   On: 10/17/2015 05:21   Ct Head Wo Contrast  Result Date: 10/17/2015 CLINICAL DATA:  Fall. Hematoma to the forehead and laceration to the top of the head. History of dementia. EXAM: CT HEAD WITHOUT CONTRAST CT CERVICAL SPINE WITHOUT CONTRAST TECHNIQUE: Multidetector CT imaging of the head and cervical spine was performed following the standard protocol without intravenous contrast. Multiplanar CT image reconstructions of the cervical spine were also generated. COMPARISON:  CT head 03/22/2011 FINDINGS: CT HEAD FINDINGS Left frontal subcutaneous scalp hematoma. Diffuse cerebral atrophy. Ventricular dilatation consistent with central atrophy. No mass effect or midline shift. No abnormal extra-axial fluid collections. Gray-white matter junctions are distinct. Basal cisterns are not effaced. No evidence of acute intracranial hemorrhage. No depressed skull fractures. Visualized paranasal sinuses and mastoid air cells are not opacified. Vascular calcifications. CT CERVICAL SPINE FINDINGS Normal alignment of the cervical spine. Diffuse degenerative changes throughout the cervical spine with narrowed interspaces and endplate hypertrophic changes throughout. Degenerative changes throughout the cervical facet joints. No vertebral compression deformities. No prevertebral soft tissue swelling. C1-2 articulation appears intact. Soft tissues are unremarkable. Vascular calcifications. IMPRESSION: No acute intracranial  abnormalities.  Prominent cerebral atrophy. Diffuse degenerative change throughout the cervical spine. Normal alignment. No acute displaced fractures identified. Electronically Signed   By: Lucienne Capers M.D.   On: 10/17/2015 06:20   Ct Cervical Spine Wo Contrast  Result Date: 10/17/2015 CLINICAL DATA:  Fall. Hematoma to the forehead and laceration to the top of the head. History of dementia. EXAM: CT HEAD WITHOUT CONTRAST CT CERVICAL SPINE WITHOUT CONTRAST TECHNIQUE: Multidetector CT imaging of the head and cervical spine was performed following the standard protocol without intravenous contrast. Multiplanar CT image reconstructions of the cervical spine were also generated. COMPARISON:  CT head 03/22/2011 FINDINGS: CT HEAD FINDINGS Left frontal subcutaneous scalp hematoma. Diffuse cerebral atrophy. Ventricular dilatation consistent with central atrophy. No mass effect or midline shift. No abnormal extra-axial fluid collections. Gray-white matter junctions are distinct. Basal cisterns are not effaced. No evidence of acute intracranial hemorrhage. No depressed skull fractures. Visualized paranasal sinuses and mastoid air cells are not opacified. Vascular calcifications. CT CERVICAL SPINE FINDINGS Normal alignment of the cervical spine. Diffuse degenerative changes throughout the cervical spine with narrowed interspaces and endplate hypertrophic changes throughout. Degenerative changes throughout the cervical facet joints. No vertebral compression deformities. No prevertebral soft tissue swelling. C1-2 articulation appears intact. Soft tissues are unremarkable. Vascular calcifications. IMPRESSION: No acute intracranial abnormalities.  Prominent cerebral atrophy. Diffuse degenerative change throughout the cervical spine. Normal alignment. No acute displaced fractures identified. Electronically Signed   By: Lucienne Capers M.D.   On: 10/17/2015 06:20    Assessment & Plan:   Kaushik was seen today for  cough.  Diagnoses and all orders for this visit:  Late onset Alzheimer's disease without behavioral disturbance  Shortness of breath -     DG Chest 2 View; Future  Acute bronchitis, unspecified organism  Other orders -     Discontinue: levofloxacin (LEVAQUIN) 250 MG tablet; Take 2 tablets (500 mg total) by mouth  daily. For 10 days -     HYDROcodone-homatropine (HYCODAN) 5-1.5 MG/5ML syrup; Take 5 mLs by mouth every 6 (six) hours as needed for cough. -     levofloxacin (LEVAQUIN) 250 MG tablet; Take 1 tablet (250 mg total) by mouth daily. For 10 days      I have discontinued Mr. Nyquist doxycycline and doxycycline. I have also changed his levofloxacin. Additionally, I am having him start on HYDROcodone-homatropine. Lastly, I am having him maintain his aspirin, Fish Oil, NATURAL SENNA LAXATIVE, albuterol, blood glucose meter kit and supplies, glucose blood, hydrOXYzine, cholecalciferol, triamcinolone cream, ondansetron, docusate sodium, traMADol, fluticasone, omeprazole, amLODipine-benazepril, gabapentin, simvastatin, potassium chloride, levothyroxine, metoprolol succinate, JANUVIA, furosemide, and meclizine.  Allergies as of 07/24/2016   No Known Allergies     Medication List       Accurate as of 07/24/16 12:33 PM. Always use your most recent med list.          albuterol (2.5 MG/3ML) 0.083% nebulizer solution Commonly known as:  PROVENTIL Take 3 mLs (2.5 mg total) by nebulization every 6 (six) hours as needed for wheezing or shortness of breath.   amLODipine-benazepril 5-20 MG capsule Commonly known as:  LOTREL TAKE (1) CAPSULE DAILY   aspirin 81 MG tablet Take 81 mg by mouth daily.   blood glucose meter kit and supplies Kit Dispense based on patient and insurance. Test bid. E11.9   cholecalciferol 1000 units tablet Commonly known as:  VITAMIN D Take 1,000 Units by mouth daily.   docusate sodium 100 MG capsule Commonly known as:  COLACE Take 1 capsule (100 mg  total) by mouth at bedtime.   Fish Oil 1000 MG Caps Take 1 capsule by mouth daily.   fluticasone 50 MCG/ACT nasal spray Commonly known as:  FLONASE Place 1 spray into both nostrils 2 (two) times daily as needed for allergies or rhinitis.   furosemide 40 MG tablet Commonly known as:  LASIX TAKE (2) TABLETS DAILY   gabapentin 300 MG capsule Commonly known as:  NEURONTIN Take 1 capsule (300 mg total) by mouth at bedtime.   glucose blood test strip Check blood sugar bid. DX- E11.9   HYDROcodone-homatropine 5-1.5 MG/5ML syrup Commonly known as:  HYCODAN Take 5 mLs by mouth every 6 (six) hours as needed for cough.   hydrOXYzine 25 MG capsule Commonly known as:  VISTARIL Take 1 capsule (25 mg total) by mouth every 6 (six) hours as needed for anxiety.   JANUVIA 50 MG tablet Generic drug:  sitaGLIPtin TAKE 1 TABLET DAILY   levofloxacin 250 MG tablet Commonly known as:  LEVAQUIN Take 1 tablet (250 mg total) by mouth daily. For 10 days   levothyroxine 88 MCG tablet Commonly known as:  SYNTHROID, LEVOTHROID Take 1 tablet (88 mcg total) by mouth daily.   meclizine 25 MG tablet Commonly known as:  ANTIVERT Take 1 tablet (25 mg total) by mouth 3 (three) times daily as needed for dizziness.   metoprolol succinate 25 MG 24 hr tablet Commonly known as:  TOPROL-XL Take 1 tablet daily. Take with or immediately following a meal.   NATURAL SENNA LAXATIVE 64.8-324 MG Tabs Take 1 tablet by mouth at bedtime.   omeprazole 20 MG capsule Commonly known as:  PRILOSEC TAKE (1) CAPSULE DAILY   ondansetron 8 MG tablet Commonly known as:  ZOFRAN Take 1 tablet (8 mg total) by mouth every 8 (eight) hours as needed for nausea or vomiting.   potassium chloride 10 MEQ tablet Commonly known as:  K-DUR,KLOR-CON Take 1 tablet 2 (two) times daily. As a potassium supplement   simvastatin 20 MG tablet Commonly known as:  ZOCOR TAKE 1 TABLET DAILY   traMADol 50 MG tablet Commonly known as:   ULTRAM Take 1 tablet (50 mg total) by mouth 4 (four) times daily as needed for moderate pain.   triamcinolone cream 0.1 % Commonly known as:  KENALOG Apply 1 application topically 2 (two) times daily.        Follow-up: Return if symptoms worsen or fail to improve.  Claretta Fraise, M.D.

## 2016-07-26 ENCOUNTER — Ambulatory Visit (INDEPENDENT_AMBULATORY_CARE_PROVIDER_SITE_OTHER): Payer: Medicare HMO | Admitting: Family Medicine

## 2016-07-26 DIAGNOSIS — M6281 Muscle weakness (generalized): Secondary | ICD-10-CM

## 2016-07-26 DIAGNOSIS — E785 Hyperlipidemia, unspecified: Secondary | ICD-10-CM

## 2016-07-26 DIAGNOSIS — E039 Hypothyroidism, unspecified: Secondary | ICD-10-CM | POA: Diagnosis not present

## 2016-07-28 DIAGNOSIS — E119 Type 2 diabetes mellitus without complications: Secondary | ICD-10-CM | POA: Diagnosis not present

## 2016-08-11 ENCOUNTER — Other Ambulatory Visit: Payer: Self-pay | Admitting: Family Medicine

## 2016-08-17 ENCOUNTER — Other Ambulatory Visit: Payer: Self-pay | Admitting: Family Medicine

## 2016-08-22 ENCOUNTER — Other Ambulatory Visit: Payer: Self-pay | Admitting: Family Medicine

## 2016-08-23 DIAGNOSIS — M6281 Muscle weakness (generalized): Secondary | ICD-10-CM | POA: Diagnosis not present

## 2016-08-23 DIAGNOSIS — E039 Hypothyroidism, unspecified: Secondary | ICD-10-CM | POA: Diagnosis not present

## 2016-08-23 DIAGNOSIS — E785 Hyperlipidemia, unspecified: Secondary | ICD-10-CM | POA: Diagnosis not present

## 2016-08-28 DIAGNOSIS — E119 Type 2 diabetes mellitus without complications: Secondary | ICD-10-CM | POA: Diagnosis not present

## 2016-09-04 ENCOUNTER — Other Ambulatory Visit: Payer: Self-pay | Admitting: Family Medicine

## 2016-09-18 ENCOUNTER — Other Ambulatory Visit: Payer: Self-pay | Admitting: Family Medicine

## 2016-09-27 DIAGNOSIS — E119 Type 2 diabetes mellitus without complications: Secondary | ICD-10-CM | POA: Diagnosis not present

## 2016-09-28 DIAGNOSIS — M79676 Pain in unspecified toe(s): Secondary | ICD-10-CM | POA: Diagnosis not present

## 2016-09-28 DIAGNOSIS — B351 Tinea unguium: Secondary | ICD-10-CM | POA: Diagnosis not present

## 2016-09-28 DIAGNOSIS — E1142 Type 2 diabetes mellitus with diabetic polyneuropathy: Secondary | ICD-10-CM | POA: Diagnosis not present

## 2016-09-28 DIAGNOSIS — L84 Corns and callosities: Secondary | ICD-10-CM | POA: Diagnosis not present

## 2016-10-14 ENCOUNTER — Other Ambulatory Visit: Payer: Self-pay | Admitting: Family Medicine

## 2016-10-14 DIAGNOSIS — R188 Other ascites: Secondary | ICD-10-CM

## 2016-10-20 DIAGNOSIS — E785 Hyperlipidemia, unspecified: Secondary | ICD-10-CM | POA: Diagnosis not present

## 2016-10-20 DIAGNOSIS — E039 Hypothyroidism, unspecified: Secondary | ICD-10-CM | POA: Diagnosis not present

## 2016-10-20 DIAGNOSIS — M6281 Muscle weakness (generalized): Secondary | ICD-10-CM | POA: Diagnosis not present

## 2016-10-25 ENCOUNTER — Ambulatory Visit (INDEPENDENT_AMBULATORY_CARE_PROVIDER_SITE_OTHER): Payer: Medicare HMO | Admitting: Family Medicine

## 2016-10-25 DIAGNOSIS — E785 Hyperlipidemia, unspecified: Secondary | ICD-10-CM

## 2016-10-25 DIAGNOSIS — E039 Hypothyroidism, unspecified: Secondary | ICD-10-CM | POA: Diagnosis not present

## 2016-10-25 DIAGNOSIS — M6281 Muscle weakness (generalized): Secondary | ICD-10-CM

## 2016-10-30 DIAGNOSIS — E119 Type 2 diabetes mellitus without complications: Secondary | ICD-10-CM | POA: Diagnosis not present

## 2016-11-17 ENCOUNTER — Other Ambulatory Visit: Payer: Self-pay | Admitting: Family Medicine

## 2016-11-20 NOTE — Telephone Encounter (Signed)
Last seen 07/24/16  Dr Stacks 

## 2016-11-22 ENCOUNTER — Other Ambulatory Visit: Payer: Self-pay | Admitting: Family Medicine

## 2016-11-27 DIAGNOSIS — E119 Type 2 diabetes mellitus without complications: Secondary | ICD-10-CM | POA: Diagnosis not present

## 2016-12-14 ENCOUNTER — Other Ambulatory Visit: Payer: Self-pay | Admitting: Family Medicine

## 2016-12-19 DIAGNOSIS — M6281 Muscle weakness (generalized): Secondary | ICD-10-CM | POA: Diagnosis not present

## 2016-12-19 DIAGNOSIS — E039 Hypothyroidism, unspecified: Secondary | ICD-10-CM | POA: Diagnosis not present

## 2016-12-19 DIAGNOSIS — E785 Hyperlipidemia, unspecified: Secondary | ICD-10-CM | POA: Diagnosis not present

## 2016-12-21 ENCOUNTER — Other Ambulatory Visit: Payer: Self-pay | Admitting: Family Medicine

## 2016-12-26 DIAGNOSIS — E1142 Type 2 diabetes mellitus with diabetic polyneuropathy: Secondary | ICD-10-CM | POA: Diagnosis not present

## 2016-12-26 DIAGNOSIS — B351 Tinea unguium: Secondary | ICD-10-CM | POA: Diagnosis not present

## 2016-12-26 DIAGNOSIS — M79676 Pain in unspecified toe(s): Secondary | ICD-10-CM | POA: Diagnosis not present

## 2016-12-26 DIAGNOSIS — L84 Corns and callosities: Secondary | ICD-10-CM | POA: Diagnosis not present

## 2016-12-27 DIAGNOSIS — E119 Type 2 diabetes mellitus without complications: Secondary | ICD-10-CM | POA: Diagnosis not present

## 2017-01-05 ENCOUNTER — Other Ambulatory Visit: Payer: Self-pay | Admitting: Family Medicine

## 2017-01-05 DIAGNOSIS — R188 Other ascites: Secondary | ICD-10-CM

## 2017-01-08 NOTE — Telephone Encounter (Signed)
Authorize 30 days only. Then contact the patient letting them know that they will need an appointment before any further prescriptions can be sent in. 

## 2017-01-12 ENCOUNTER — Other Ambulatory Visit: Payer: Self-pay | Admitting: Family Medicine

## 2017-01-26 ENCOUNTER — Other Ambulatory Visit: Payer: Self-pay | Admitting: Family Medicine

## 2017-01-30 DIAGNOSIS — E119 Type 2 diabetes mellitus without complications: Secondary | ICD-10-CM | POA: Diagnosis not present

## 2017-01-31 ENCOUNTER — Other Ambulatory Visit: Payer: Self-pay | Admitting: Family Medicine

## 2017-02-09 ENCOUNTER — Other Ambulatory Visit: Payer: Self-pay | Admitting: Family Medicine

## 2017-02-09 DIAGNOSIS — R188 Other ascites: Secondary | ICD-10-CM

## 2017-02-12 ENCOUNTER — Other Ambulatory Visit: Payer: Self-pay | Admitting: Family Medicine

## 2017-02-12 NOTE — Telephone Encounter (Signed)
Aware that patient needs to be seen

## 2017-02-12 NOTE — Telephone Encounter (Signed)
Authorize 30 days only. Then contact the patient letting them know that they will need an appointment before any further prescriptions can be sent in. 

## 2017-02-12 NOTE — Telephone Encounter (Signed)
Last seen 07/24/16 Dr Livia Snellen  Last thyroid 10/27/15

## 2017-02-13 ENCOUNTER — Encounter (HOSPITAL_COMMUNITY): Payer: Self-pay | Admitting: Emergency Medicine

## 2017-02-13 ENCOUNTER — Observation Stay (HOSPITAL_COMMUNITY)
Admission: EM | Admit: 2017-02-13 | Discharge: 2017-02-14 | Disposition: A | Discharge status: Home / Self Care | Payer: Medicare HMO | Attending: Internal Medicine | Admitting: Internal Medicine

## 2017-02-13 ENCOUNTER — Emergency Department (HOSPITAL_COMMUNITY): Payer: Medicare HMO

## 2017-02-13 DIAGNOSIS — G894 Chronic pain syndrome: Secondary | ICD-10-CM | POA: Insufficient documentation

## 2017-02-13 DIAGNOSIS — I5032 Chronic diastolic (congestive) heart failure: Secondary | ICD-10-CM | POA: Diagnosis present

## 2017-02-13 DIAGNOSIS — M545 Low back pain: Secondary | ICD-10-CM | POA: Insufficient documentation

## 2017-02-13 DIAGNOSIS — I2510 Atherosclerotic heart disease of native coronary artery without angina pectoris: Secondary | ICD-10-CM | POA: Diagnosis not present

## 2017-02-13 DIAGNOSIS — E1122 Type 2 diabetes mellitus with diabetic chronic kidney disease: Secondary | ICD-10-CM | POA: Diagnosis not present

## 2017-02-13 DIAGNOSIS — M549 Dorsalgia, unspecified: Secondary | ICD-10-CM

## 2017-02-13 DIAGNOSIS — I130 Hypertensive heart and chronic kidney disease with heart failure and stage 1 through stage 4 chronic kidney disease, or unspecified chronic kidney disease: Secondary | ICD-10-CM | POA: Insufficient documentation

## 2017-02-13 DIAGNOSIS — E119 Type 2 diabetes mellitus without complications: Secondary | ICD-10-CM | POA: Diagnosis not present

## 2017-02-13 DIAGNOSIS — G8929 Other chronic pain: Secondary | ICD-10-CM | POA: Diagnosis present

## 2017-02-13 DIAGNOSIS — G309 Alzheimer's disease, unspecified: Secondary | ICD-10-CM | POA: Diagnosis not present

## 2017-02-13 DIAGNOSIS — E1140 Type 2 diabetes mellitus with diabetic neuropathy, unspecified: Secondary | ICD-10-CM | POA: Insufficient documentation

## 2017-02-13 DIAGNOSIS — I10 Essential (primary) hypertension: Secondary | ICD-10-CM | POA: Diagnosis not present

## 2017-02-13 DIAGNOSIS — R110 Nausea: Secondary | ICD-10-CM | POA: Diagnosis not present

## 2017-02-13 DIAGNOSIS — N183 Chronic kidney disease, stage 3 unspecified: Secondary | ICD-10-CM | POA: Diagnosis present

## 2017-02-13 DIAGNOSIS — Z87891 Personal history of nicotine dependence: Secondary | ICD-10-CM | POA: Insufficient documentation

## 2017-02-13 DIAGNOSIS — E039 Hypothyroidism, unspecified: Secondary | ICD-10-CM | POA: Diagnosis not present

## 2017-02-13 DIAGNOSIS — Z79899 Other long term (current) drug therapy: Secondary | ICD-10-CM | POA: Diagnosis not present

## 2017-02-13 DIAGNOSIS — I252 Old myocardial infarction: Secondary | ICD-10-CM | POA: Insufficient documentation

## 2017-02-13 DIAGNOSIS — I951 Orthostatic hypotension: Secondary | ICD-10-CM | POA: Diagnosis not present

## 2017-02-13 DIAGNOSIS — F0390 Unspecified dementia without behavioral disturbance: Secondary | ICD-10-CM | POA: Diagnosis not present

## 2017-02-13 DIAGNOSIS — R42 Dizziness and giddiness: Principal | ICD-10-CM

## 2017-02-13 DIAGNOSIS — Z7984 Long term (current) use of oral hypoglycemic drugs: Secondary | ICD-10-CM | POA: Insufficient documentation

## 2017-02-13 DIAGNOSIS — E876 Hypokalemia: Secondary | ICD-10-CM | POA: Diagnosis present

## 2017-02-13 DIAGNOSIS — R404 Transient alteration of awareness: Secondary | ICD-10-CM | POA: Diagnosis not present

## 2017-02-13 DIAGNOSIS — F0280 Dementia in other diseases classified elsewhere without behavioral disturbance: Secondary | ICD-10-CM | POA: Diagnosis present

## 2017-02-13 LAB — URINALYSIS, ROUTINE W REFLEX MICROSCOPIC
BILIRUBIN URINE: NEGATIVE
Bacteria, UA: NONE SEEN
HGB URINE DIPSTICK: NEGATIVE
KETONES UR: NEGATIVE mg/dL
LEUKOCYTES UA: NEGATIVE
NITRITE: NEGATIVE
PH: 6 (ref 5.0–8.0)
Protein, ur: NEGATIVE mg/dL
Specific Gravity, Urine: 1.014 (ref 1.005–1.030)
Squamous Epithelial / LPF: NONE SEEN

## 2017-02-13 LAB — GLUCOSE, CAPILLARY: Glucose-Capillary: 208 mg/dL — ABNORMAL HIGH (ref 65–99)

## 2017-02-13 LAB — CBC WITH DIFFERENTIAL/PLATELET
Basophils Absolute: 0.1 10*3/uL (ref 0.0–0.1)
Basophils Relative: 1 %
EOS PCT: 1 %
Eosinophils Absolute: 0.1 10*3/uL (ref 0.0–0.7)
HCT: 40 % (ref 39.0–52.0)
Hemoglobin: 13.6 g/dL (ref 13.0–17.0)
LYMPHS ABS: 1.5 10*3/uL (ref 0.7–4.0)
LYMPHS PCT: 22 %
MCH: 29.8 pg (ref 26.0–34.0)
MCHC: 34 g/dL (ref 30.0–36.0)
MCV: 87.5 fL (ref 78.0–100.0)
MONO ABS: 0.5 10*3/uL (ref 0.1–1.0)
MONOS PCT: 7 %
Neutro Abs: 4.7 10*3/uL (ref 1.7–7.7)
Neutrophils Relative %: 69 %
PLATELETS: 175 10*3/uL (ref 150–400)
RBC: 4.57 MIL/uL (ref 4.22–5.81)
RDW: 13.4 % (ref 11.5–15.5)
WBC: 6.8 10*3/uL (ref 4.0–10.5)

## 2017-02-13 LAB — BASIC METABOLIC PANEL
Anion gap: 10 (ref 5–15)
BUN: 17 mg/dL (ref 6–20)
CALCIUM: 8.7 mg/dL — AB (ref 8.9–10.3)
CO2: 27 mmol/L (ref 22–32)
Chloride: 99 mmol/L — ABNORMAL LOW (ref 101–111)
Creatinine, Ser: 1.36 mg/dL — ABNORMAL HIGH (ref 0.61–1.24)
GFR calc Af Amer: 52 mL/min — ABNORMAL LOW (ref >60)
GFR calc non Af Amer: 44 mL/min — ABNORMAL LOW (ref >60)
GLUCOSE: 245 mg/dL — AB (ref 65–99)
Potassium: 3.6 mmol/L (ref 3.5–5.1)
Sodium: 136 mmol/L (ref 135–145)

## 2017-02-13 LAB — TROPONIN I

## 2017-02-13 MED ORDER — INSULIN ASPART 100 UNIT/ML ~~LOC~~ SOLN
0.0000 [IU] | Freq: Every day | SUBCUTANEOUS | Status: DC
Start: 2017-02-13 — End: 2017-02-14
  Administered 2017-02-13: 2 [IU] via SUBCUTANEOUS

## 2017-02-13 MED ORDER — GABAPENTIN 300 MG PO CAPS
300.0000 mg | ORAL_CAPSULE | Freq: Every day | ORAL | Status: DC
  Administered 2017-02-13: 300 mg via ORAL
  Filled 2017-02-13: qty 1

## 2017-02-13 MED ORDER — ASPIRIN 81 MG PO CHEW
81.0000 mg | CHEWABLE_TABLET | Freq: Every day | ORAL | Status: DC
  Administered 2017-02-14: 81 mg via ORAL
  Filled 2017-02-13: qty 1

## 2017-02-13 MED ORDER — ALBUTEROL SULFATE (2.5 MG/3ML) 0.083% IN NEBU: 2.5000 mg | INHALATION_SOLUTION | Freq: Four times a day (QID) | RESPIRATORY_TRACT | Status: DC | PRN

## 2017-02-13 MED ORDER — INSULIN ASPART 100 UNIT/ML ~~LOC~~ SOLN
0.0000 [IU] | Freq: Three times a day (TID) | SUBCUTANEOUS | Status: DC
  Administered 2017-02-14: 3 [IU] via SUBCUTANEOUS
  Administered 2017-02-14: 5 [IU] via SUBCUTANEOUS

## 2017-02-13 MED ORDER — SODIUM CHLORIDE 0.9 % IV BOLUS (SEPSIS)
500.0000 mL | Freq: Once | INTRAVENOUS | Status: AC
  Administered 2017-02-13: 500 mL via INTRAVENOUS

## 2017-02-13 MED ORDER — FLUTICASONE PROPIONATE 50 MCG/ACT NA SUSP: 1.0000 | Freq: Two times a day (BID) | NASAL | Status: DC | PRN

## 2017-02-13 MED ORDER — HYDROCODONE-ACETAMINOPHEN 5-325 MG PO TABS: 1.0000 | ORAL_TABLET | ORAL | Status: DC | PRN

## 2017-02-13 MED ORDER — POTASSIUM CHLORIDE CRYS ER 10 MEQ PO TBCR
10.0000 meq | EXTENDED_RELEASE_TABLET | Freq: Two times a day (BID) | ORAL | Status: DC
  Administered 2017-02-13 – 2017-02-14 (×2): 10 meq via ORAL
  Filled 2017-02-13 (×2): qty 1

## 2017-02-13 MED ORDER — HEPARIN SODIUM (PORCINE) 5000 UNIT/ML IJ SOLN
5000.0000 [IU] | Freq: Three times a day (TID) | INTRAMUSCULAR | Status: DC
  Administered 2017-02-13: 5000 [IU] via SUBCUTANEOUS
  Filled 2017-02-13 (×2): qty 1

## 2017-02-13 MED ORDER — METOPROLOL SUCCINATE ER 25 MG PO TB24
25.0000 mg | ORAL_TABLET | Freq: Every day | ORAL | Status: DC
  Administered 2017-02-14: 25 mg via ORAL
  Filled 2017-02-13: qty 1

## 2017-02-13 MED ORDER — PROMETHAZINE HCL 25 MG/ML IJ SOLN: 12.5000 mg | Freq: Four times a day (QID) | INTRAMUSCULAR | Status: DC | PRN

## 2017-02-13 MED ORDER — MECLIZINE HCL 12.5 MG PO TABS
25.0000 mg | ORAL_TABLET | Freq: Once | ORAL | Status: AC
  Administered 2017-02-13: 25 mg via ORAL
  Filled 2017-02-13: qty 2

## 2017-02-13 MED ORDER — AMLODIPINE BESYLATE 5 MG PO TABS
10.0000 mg | ORAL_TABLET | Freq: Every day | ORAL | Status: DC
  Administered 2017-02-14: 10 mg via ORAL
  Filled 2017-02-13: qty 2

## 2017-02-13 MED ORDER — SENNA 8.6 MG PO TABS
1.0000 | ORAL_TABLET | Freq: Every day | ORAL | Status: DC
  Administered 2017-02-13: 8.6 mg via ORAL
  Filled 2017-02-13: qty 1

## 2017-02-13 MED ORDER — SODIUM CHLORIDE 0.9 % IV SOLN: INTRAVENOUS | Status: AC

## 2017-02-13 MED ORDER — MECLIZINE HCL 12.5 MG PO TABS: 25.0000 mg | ORAL_TABLET | Freq: Three times a day (TID) | ORAL | Status: DC | PRN

## 2017-02-13 MED ORDER — ONDANSETRON HCL 4 MG/2ML IJ SOLN: 4.0000 mg | Freq: Four times a day (QID) | INTRAMUSCULAR | Status: DC | PRN

## 2017-02-13 MED ORDER — LORAZEPAM 2 MG/ML IJ SOLN
0.5000 mg | Freq: Once | INTRAMUSCULAR | Status: AC
  Administered 2017-02-13: 0.5 mg via INTRAVENOUS
  Filled 2017-02-13: qty 1

## 2017-02-13 MED ORDER — HYDROCODONE-HOMATROPINE 5-1.5 MG/5ML PO SYRP: 5.0000 mL | ORAL_SOLUTION | Freq: Four times a day (QID) | ORAL | Status: DC | PRN

## 2017-02-13 MED ORDER — ACETAMINOPHEN 650 MG RE SUPP: 650.0000 mg | Freq: Four times a day (QID) | RECTAL | Status: DC | PRN

## 2017-02-13 MED ORDER — TRAMADOL HCL 50 MG PO TABS: 50.0000 mg | ORAL_TABLET | Freq: Four times a day (QID) | ORAL | Status: DC | PRN

## 2017-02-13 MED ORDER — ONDANSETRON HCL 4 MG PO TABS: 4.0000 mg | ORAL_TABLET | Freq: Four times a day (QID) | ORAL | Status: DC | PRN

## 2017-02-13 MED ORDER — LORAZEPAM 2 MG/ML IJ SOLN: 0.5000 mg | Freq: Four times a day (QID) | INTRAMUSCULAR | Status: DC | PRN

## 2017-02-13 MED ORDER — SIMVASTATIN 20 MG PO TABS: 20.0000 mg | ORAL_TABLET | Freq: Every day | ORAL | Status: DC

## 2017-02-13 MED ORDER — PANTOPRAZOLE SODIUM 40 MG PO TBEC
40.0000 mg | DELAYED_RELEASE_TABLET | Freq: Every day | ORAL | Status: DC
  Administered 2017-02-14: 40 mg via ORAL
  Filled 2017-02-13: qty 1

## 2017-02-13 MED ORDER — BENAZEPRIL HCL 10 MG PO TABS
20.0000 mg | ORAL_TABLET | Freq: Every day | ORAL | Status: DC
  Administered 2017-02-14: 20 mg via ORAL
  Filled 2017-02-13: qty 1
  Filled 2017-02-13: qty 2

## 2017-02-13 MED ORDER — ZINC OXIDE 20 % EX OINT
TOPICAL_OINTMENT | Freq: Every day | CUTANEOUS | Status: DC
  Filled 2017-02-13: qty 28.35

## 2017-02-13 MED ORDER — OMEGA-3-ACID ETHYL ESTERS 1 G PO CAPS
1.0000 g | ORAL_CAPSULE | Freq: Every day | ORAL | Status: DC
  Administered 2017-02-14: 1 g via ORAL
  Filled 2017-02-13: qty 1

## 2017-02-13 MED ORDER — LEVOTHYROXINE SODIUM 88 MCG PO TABS
88.0000 ug | ORAL_TABLET | Freq: Every day | ORAL | Status: DC
  Administered 2017-02-14: 88 ug via ORAL
  Filled 2017-02-13: qty 1

## 2017-02-13 MED ORDER — VITAMIN D 1000 UNITS PO TABS
1000.0000 [IU] | ORAL_TABLET | Freq: Every day | ORAL | Status: DC
  Administered 2017-02-14: 1000 [IU] via ORAL
  Filled 2017-02-13 (×2): qty 1

## 2017-02-13 MED ORDER — DOCUSATE SODIUM 100 MG PO CAPS
100.0000 mg | ORAL_CAPSULE | Freq: Every day | ORAL | Status: DC
  Administered 2017-02-13: 100 mg via ORAL
  Filled 2017-02-13: qty 1

## 2017-02-13 MED ORDER — HYDROXYZINE HCL 25 MG PO TABS: 25.0000 mg | ORAL_TABLET | ORAL | Status: DC | PRN

## 2017-02-13 MED ORDER — ACETAMINOPHEN 325 MG PO TABS: 650.0000 mg | ORAL_TABLET | Freq: Four times a day (QID) | ORAL | Status: DC | PRN

## 2017-02-13 NOTE — ED Notes (Signed)
Pt returned from MRI °

## 2017-02-13 NOTE — ED Notes (Signed)
Patient transported to MRI 

## 2017-02-13 NOTE — ED Notes (Signed)
Pt unable to provide urine at this time.  Family at bedside.

## 2017-02-13 NOTE — ED Provider Notes (Signed)
[facility] DEPT Provider Note   CSN: [id number] Arrival date & time: 02/13/17  8466     History   Chief Complaint Chief Complaint  Patient presents with  . Dizziness    HPI [name] is a 83 y.o. male.  HPI History limited due to dementia and extremely hard of hearing. Level V caveat applies. Presents by EMS for episode of dizziness after getting up to go to the bathroom. Associated with nausea. Has history of vertigo and was given meclizine by his daughter. Currently is denying any focal pain. Noted to have steady gait by EMS. Past Medical History:  Diagnosis Date  . Aortic insufficiency   . Aortic valve sclerosis   . Bronchitis   . CHF (congestive heart failure) ([place])   . Dementia   . Diabetes mellitus    Diet control   . Diverticulosis   . Dyslipidemia   . GERD (gastroesophageal reflux disease)   . Hard of hearing   . Hemorrhoids   . Hernia cerebri ([place])   . History of pneumonia   . Hypertension   . Hypothyroidism   . MI (myocardial infarction) ([place]) 1996   percutaneous coronary intervention   . Small bowel obstruction [facility])     Patient Active Problem List   Diagnosis Date Noted  . Hypokalemia 02/14/2017  . Vertigo 02/13/2017  . CKD (chronic kidney disease), stage III 02/13/2017  . Chronic diastolic CHF (congestive heart failure) ([place]) 02/13/2017  . Orthostasis 02/13/2017  . Eczema 08/31/2015  . Ascites 08/31/2015  . Right hip pain 08/10/2015  . Diabetic polyneuropathy associated with type 2 diabetes mellitus ([place]) 08/10/2015  . Alzheimer's dementia 08/10/2015  . Hypothyroid 12/04/2012  . Chronic back pain 12/04/2012  . Hard of hearing 09/03/2012  . Aortic insufficiency 09/03/2012  . Vitamin D deficiency 09/03/2012  . Arthritis 09/03/2012  . Insomnia 09/03/2012  . Iron deficiency anemia, unspecified 07/31/2011  . Diabetes mellitus type II, non insulin dependent ([place]) 11/30/2009  . Hyperlipidemia 11/30/2009  . Essential hypertension  11/30/2009  . CORONARY ATHEROSCLEROSIS NATIVE CORONARY ARTERY 11/30/2009    Past Surgical History:  Procedure Laterality Date  . ABDOMINAL HERNIA REPAIR     multiple sx's  . CATARACT EXTRACTION     left eye  . CHOLECYSTECTOMY    . COLONOSCOPY  2008  . EXPLORATORY LAPAROTOMY W/ BOWEL RESECTION     ventral hernia repair  . TRANSURETHRAL RESECTION OF PROSTATE         Home Medications    Prior to Admission medications   Medication Sig Start Date End Date Taking? Authorizing Provider  albuterol (PROVENTIL) (2.5 MG/3ML) 0.083% nebulizer solution Take 3 mLs (2.5 mg total) by nebulization every 6 (six) hours as needed for wheezing or shortness of breath. 07/02/14  Yes Chipper Herb, MD  amLODipine-benazepril (LOTREL) 5-20 MG capsule TAKE (1) CAPSULE DAILY 01/08/17  Yes Claretta Fraise, MD  aspirin 81 MG tablet Take 81 mg by mouth daily.   Yes [provider]  cholecalciferol (VITAMIN D) 1000 units tablet Take 1,000 Units by mouth daily.   Yes [provider]  Diaper Rash Products (DESITIN EX) Apply 1 application topically daily.   Yes [provider]  docusate sodium (COLACE) 100 MG capsule Take 1 capsule (100 mg total) by mouth at bedtime. 11/01/15  Yes Claretta Fraise, MD  fluticasone (FLONASE) 50 MCG/ACT nasal spray Place 1 spray into both nostrils 2 (two) times daily as needed for allergies or rhinitis. 04/28/16  Yes Dettinger, Vonna Kotyk  A, MD  furosemide (LASIX) 40 MG tablet TAKE (2) TABLETS DAILY 02/12/17  Yes Stacks, Cletus Gash, MD  gabapentin (NEURONTIN) 300 MG capsule Take 1 capsule (300 mg total) by mouth at bedtime. 01/30/17  Yes Stacks, Cletus Gash, MD  HYDROcodone-homatropine [facility]) 5-1.5 MG/5ML syrup Take 5 mLs by mouth every 6 (six) hours as needed for cough. 07/24/16  Yes Claretta Fraise, MD  hydrOXYzine (VISTARIL) 25 MG capsule Take 1 capsule (25 mg total) by mouth every 6 (six) hours as needed for anxiety. 06/25/15  Yes Stacks, Cletus Gash, MD  JANUVIA 50 MG tablet TAKE  1 TABLET DAILY 01/15/17  Yes Claretta Fraise, MD  levothyroxine (SYNTHROID, LEVOTHROID) 88 MCG tablet Take 1 tablet (88 mcg total) by mouth daily. 02/12/17  Yes Claretta Fraise, MD  meclizine (ANTIVERT) 25 MG tablet Take 1 tablet (25 mg total) by mouth 3 (three) times daily as needed for dizziness. 07/19/16  Yes Terald Sleeper, PA-C  metoprolol succinate (TOPROL-XL) 25 MG 24 hr tablet TAKE 1 TABLET ONCE DAILY WITH A MEAL 02/12/17  Yes Stacks, Cletus Gash, MD  Natural Senna Laxative 64.8-324 MG TABS Take 1 tablet by mouth at bedtime.    Yes [provider]  Omega-3 Fatty Acids (FISH OIL) 1000 MG CAPS Take 1 capsule by mouth daily.   Yes [provider]  omeprazole (PRILOSEC) 20 MG capsule TAKE (1) CAPSULE DAILY 01/30/17  Yes Claretta Fraise, MD  ondansetron (ZOFRAN) 8 MG tablet Take 1 tablet (8 mg total) by mouth every 8 (eight) hours as needed for nausea or vomiting. 10/17/15  Yes Daleen Bo, MD  potassium chloride (K-DUR,KLOR-CON) 10 MEQ tablet Take 1 tablet 2 (two) times daily. As a potassium supplement 07/10/16  Yes Stacks, Cletus Gash, MD  simvastatin (ZOCOR) 20 MG tablet TAKE 1 TABLET DAILY 01/15/17  Yes Claretta Fraise, MD  traMADol (ULTRAM) 50 MG tablet Take 1 tablet (50 mg total) by mouth 4 (four) times daily as needed for moderate pain. 12/28/15  Yes Claretta Fraise, MD  blood glucose meter kit and supplies KIT Dispense based on patient and insurance. Test bid. E11.9 10/19/14   Chipper Herb, MD  glucose blood test strip Check blood sugar bid. DX- E11.9 10/19/14   Chipper Herb, MD    Family History Family History  Problem Relation Age of Onset  . Colon cancer Neg Hx     Social History Social History  Substance Use Topics  . Smoking status: Former Smoker    Quit date: 08/09/1942  . Smokeless tobacco: Never Used  . Alcohol use No     Allergies   Patient has no known allergies.   Review of Systems Review of Systems  Unable to perform ROS: Dementia     Physical  Exam Updated Vital Signs BP 139/64 (BP Location: Left Arm)   Pulse 97   Temp 97.7 F (36.5 C) (Oral)   Resp 12   Wt 93.3 kg (205 lb 11.2 oz)   SpO2 98%   BMI 34.23 kg/m   Physical Exam  Constitutional: He appears well-developed and well-nourished. No distress.  HENT:  Head: Normocephalic and atraumatic.  Mouth/Throat: Oropharynx is clear and moist. No oropharyngeal exudate.  Eyes: Pupils are equal, round, and reactive to light. EOM are normal.  No nystagmus  Neck: Normal range of motion. Neck supple.  Cardiovascular: Normal rate and regular rhythm.  Exam reveals no gallop and no friction rub.   No murmur heard. Pulmonary/Chest: Effort normal and breath sounds normal. No respiratory distress. He has no  wheezes. He has no rales. He exhibits no tenderness.  Abdominal: Soft. Bowel sounds are normal. He exhibits distension. There is no tenderness. There is no rebound and no guarding.  Large ventral wall hernia which is nontender  Musculoskeletal: Normal range of motion. He exhibits no edema or tenderness.  No lower extremity swelling or asymmetry.  Neurological: He is alert.  Follows commands. Answers simple questions.5/5 motor in all extremities. Sensation is grossly intact. No facial asymmetry. Bilateral finger-to-nose is intact.  Skin: Skin is warm and dry. Capillary refill takes less than 2 seconds. No rash noted. No erythema.  Psychiatric: He has a normal mood and affect. His behavior is normal.  Nursing note and vitals reviewed.    ED Treatments / Results  Labs (all labs ordered are listed, but only abnormal results are displayed) Labs Reviewed  BASIC METABOLIC PANEL - Abnormal; Notable for the following:       Result Value   Chloride 99 (*)    Glucose, Bld 245 (*)    Creatinine, Ser 1.36 (*)    Calcium 8.7 (*)    GFR calc non Af Amer 44 (*)    GFR calc Af Amer 52 (*)    All other components within normal limits  URINALYSIS, ROUTINE W REFLEX MICROSCOPIC - Abnormal;  Notable for the following:    Glucose, UA >=500 (*)    All other components within normal limits  BASIC METABOLIC PANEL - Abnormal; Notable for the following:    Potassium 3.4 (*)    Glucose, Bld 176 (*)    Calcium 8.3 (*)    GFR calc non Af Amer 54 (*)    All other components within normal limits  CBC - Abnormal; Notable for the following:    Hemoglobin 12.4 (*)    HCT 37.5 (*)    All other components within normal limits  GLUCOSE, CAPILLARY - Abnormal; Notable for the following:    Glucose-Capillary 208 (*)    All other components within normal limits  GLUCOSE, CAPILLARY - Abnormal; Notable for the following:    Glucose-Capillary 167 (*)    All other components within normal limits  GLUCOSE, CAPILLARY - Abnormal; Notable for the following:    Glucose-Capillary 225 (*)    All other components within normal limits  CBC WITH DIFFERENTIAL/PLATELET  TROPONIN I    EKG  EKG Interpretation  Date/Time:  Tuesday February 13 2017 09:31:57 EDT Ventricular Rate:  68 PR Interval:    QRS Duration: 128 QT Interval:  429 QTC Calculation: 457 R Axis:   -11 Text Interpretation:  Sinus rhythm Short PR interval Nonspecific intraventricular conduction delay Nonspecific T abnormalities, lateral leads Confirmed by Lita Mains  MD, Aman Batley ([id number]) on 02/13/2017 3:52:23 PM       Radiology Mr Brain Wo Contrast  Result Date: 02/13/2017 CLINICAL DATA:  Initial evaluation for persistent vertigo. EXAM: MRI HEAD WITHOUT CONTRAST TECHNIQUE: Multiplanar, multiecho pulse sequences of the brain and surrounding structures were obtained without intravenous contrast. COMPARISON:  Comparison made with prior CT from 10/17/2015. FINDINGS: Brain: Study fairly degraded by motion artifact. Diffuse prominence of the CSF containing spaces compatible with generalized age related cerebral atrophy, moderate nature. Mild chronic microvascular ischemic changes present within the periventricular white matter. No definite  abnormal foci of restricted diffusion to suggest acute or subacute ischemia. Gray-white matter differentiation maintained. No definite encephalomalacia to suggest chronic infarction. No appreciable foci of susceptibility fact to suggest acute or chronic intracranial hemorrhage. No mass lesion, midline shift or mass  effect. Diffuse ventricular prominence related to global parenchymal volume loss without hydrocephalus. No extra-axial fluid collection. Vascular: Right vertebral artery likely hypoplastic and not well visualized. Major intracranial vascular flow voids otherwise grossly maintained. Skull and upper cervical spine: Craniocervical junction not well evaluated on this motion degraded exam, but grossly unremarkable. Upper cervical spine within normal limits. Bone marrow signal intensity grossly within normal limits. Scalp soft tissues unremarkable. Sinuses/Orbits: Globes and oval soft tissues grossly unremarkable. Patient status post lens extraction bilaterally. Paranasal sinuses grossly clear. No appreciable mastoid effusion. Other: None. IMPRESSION: 1. Motion degraded exam. 2. No acute intracranial abnormality identified. 3. Moderate age-related cerebral atrophy. Electronically Signed   By: Jeannine Boga M.D.   On: 02/13/2017 16:46    Procedures Procedures (including critical care time)  Medications Ordered in ED Medications  zinc oxide 20 % ointment ( Topical Not Given 02/14/17 1000)  levothyroxine (SYNTHROID, LEVOTHROID) tablet 88 mcg (88 mcg Oral Given 02/14/17 0937)  metoprolol succinate (TOPROL-XL) 24 hr tablet 25 mg (25 mg Oral Given 02/14/17 0937)  gabapentin (NEURONTIN) capsule 300 mg (300 mg Oral Given 02/13/17 2318)  pantoprazole (PROTONIX) EC tablet 40 mg (40 mg Oral Given 02/14/17 0936)  simvastatin (ZOCOR) tablet 20 mg (not administered)  benazepril (LOTENSIN) tablet 20 mg (20 mg Oral Given 02/14/17 0936)  HYDROcodone-homatropine (HYCODAN) 5-1.5 MG/5ML syrup 5 mL (not  administered)  meclizine (ANTIVERT) tablet 25 mg (not administered)  potassium chloride SA (K-DUR,KLOR-CON) CR tablet 10 mEq (10 mEq Oral Given 02/14/17 0936)  fluticasone (FLONASE) 50 MCG/ACT nasal spray 1 spray (not administered)  traMADol (ULTRAM) tablet 50 mg (not administered)  docusate sodium (COLACE) capsule 100 mg (100 mg Oral Given 02/13/17 2318)  cholecalciferol (VITAMIN D) tablet 1,000 Units (1,000 Units Oral Given 02/14/17 0936)  hydrOXYzine (ATARAX/VISTARIL) tablet 25 mg (not administered)  albuterol (PROVENTIL) (2.5 MG/3ML) 0.083% nebulizer solution 2.5 mg (not administered)  aspirin chewable tablet 81 mg (81 mg Oral Given 02/14/17 0937)  senna (SENOKOT) tablet 8.6 mg (8.6 mg Oral Given 02/13/17 2318)  omega-3 acid ethyl esters (LOVAZA) capsule 1 g (1 g Oral Given 02/14/17 0936)  amLODipine (NORVASC) tablet 10 mg (10 mg Oral Given 02/14/17 0936)  heparin injection 5,000 Units (5,000 Units Subcutaneous Not Given 02/14/17 1400)  0.9 %  sodium chloride infusion (not administered)  acetaminophen (TYLENOL) tablet 650 mg (not administered)    Or  acetaminophen (TYLENOL) suppository 650 mg (not administered)  HYDROcodone-acetaminophen (NORCO/VICODIN) 5-325 MG per tablet 1-2 tablet (not administered)  ondansetron (ZOFRAN) tablet 4 mg (not administered)    Or  ondansetron (ZOFRAN) injection 4 mg (not administered)  insulin aspart (novoLOG) injection 0-15 Units (5 Units Subcutaneous Given 02/14/17 1201)  insulin aspart (novoLOG) injection 0-5 Units (2 Units Subcutaneous Given 02/13/17 2319)  LORazepam (ATIVAN) injection 0.5-1 mg (not administered)  promethazine (PHENERGAN) injection 12.5 mg (not administered)  meclizine (ANTIVERT) tablet 25 mg (25 mg Oral Given 02/13/17 1325)  LORazepam (ATIVAN) injection 0.5 mg (0.5 mg Intravenous Given 02/13/17 1448)  sodium chloride 0.9 % bolus 500 mL (0 mLs Intravenous Stopped 02/13/17 1621)     Initial Impression / Assessment and Plan / ED Course  I have  reviewed the triage vital signs and the nursing notes.  Pertinent labs & imaging results that were available during my care of the patient were reviewed by me and considered in my medical decision making (see chart for details).    Given meclizine and attempted to ambulate patient. Became dizzy and nauseated. Dosed with IV Ativan  and given bolus of normal saline. Currently resting comfortably and asymptomatic. Discussed with family who state patient has had long-standing vertigo but that symptoms have been worse today. We'll get MRI brain and then reevaluate.  Signed out to oncoming emergency physician pending MRI brain.   Final Clinical Impressions(s) / ED Diagnoses   Final diagnoses:  Vertigo    New Prescriptions Current Discharge Medication List       Julianne Rice, MD 02/14/17 1315

## 2017-02-13 NOTE — ED Provider Notes (Signed)
5:05 PM patient is unable to stand despite assistance of 2 people after treatment with meclizine and Ativanan intravenous normal saline bolus.  Dr.Opyd from hospital service consulted and will see patient in ED and arrange for overnight stay. Renal insufficiency is chronic. vertigo likely peripheral in etiology  Results for orders placed or performed during the hospital encounter of 02/13/17  CBC with Differential  Result Value Ref Range   WBC 6.8 4.0 - 10.5 K/uL   RBC 4.57 4.22 - 5.81 MIL/uL   Hemoglobin 13.6 13.0 - 17.0 g/dL   HCT 40.0 39.0 - 52.0 %   MCV 87.5 78.0 - 100.0 fL   MCH 29.8 26.0 - 34.0 pg   MCHC 34.0 30.0 - 36.0 g/dL   RDW 13.4 11.5 - 15.5 %   Platelets 175 150 - 400 K/uL   Neutrophils Relative % 69 %   Neutro Abs 4.7 1.7 - 7.7 K/uL   Lymphocytes Relative 22 %   Lymphs Abs 1.5 0.7 - 4.0 K/uL   Monocytes Relative 7 %   Monocytes Absolute 0.5 0.1 - 1.0 K/uL   Eosinophils Relative 1 %   Eosinophils Absolute 0.1 0.0 - 0.7 K/uL   Basophils Relative 1 %   Basophils Absolute 0.1 0.0 - 0.1 K/uL  Basic metabolic panel  Result Value Ref Range   Sodium 136 135 - 145 mmol/L   Potassium 3.6 3.5 - 5.1 mmol/L   Chloride 99 (L) 101 - 111 mmol/L   CO2 27 22 - 32 mmol/L   Glucose, Bld 245 (H) 65 - 99 mg/dL   BUN 17 6 - 20 mg/dL   Creatinine, Ser 1.36 (H) 0.61 - 1.24 mg/dL   Calcium 8.7 (L) 8.9 - 10.3 mg/dL   GFR calc non Af Amer 44 (L) >60 mL/min   GFR calc Af Amer 52 (L) >60 mL/min   Anion gap 10 5 - 15  Troponin I  Result Value Ref Range   Troponin I <0.03 <0.03 ng/mL  Urinalysis, Routine w reflex microscopic  Result Value Ref Range   Color, Urine YELLOW YELLOW   APPearance CLEAR CLEAR   Specific Gravity, Urine 1.014 1.005 - 1.030   pH 6.0 5.0 - 8.0   Glucose, UA >=500 (A) NEGATIVE mg/dL   Hgb urine dipstick NEGATIVE NEGATIVE   Bilirubin Urine NEGATIVE NEGATIVE   Ketones, ur NEGATIVE NEGATIVE mg/dL   Protein, ur NEGATIVE NEGATIVE mg/dL   Nitrite NEGATIVE NEGATIVE    Leukocytes, UA NEGATIVE NEGATIVE   RBC / HPF 0-5 0 - 5 RBC/hpf   WBC, UA 0-5 0 - 5 WBC/hpf   Bacteria, UA NONE SEEN NONE SEEN   Squamous Epithelial / LPF NONE SEEN NONE SEEN   Mr Brain Wo Contrast  Result Date: 02/13/2017 CLINICAL DATA:  Initial evaluation for persistent vertigo. EXAM: MRI HEAD WITHOUT CONTRAST TECHNIQUE: Multiplanar, multiecho pulse sequences of the brain and surrounding structures were obtained without intravenous contrast. COMPARISON:  Comparison made with prior CT from 10/17/2015. FINDINGS: Brain: Study fairly degraded by motion artifact. Diffuse prominence of the CSF containing spaces compatible with generalized age related cerebral atrophy, moderate nature. Mild chronic microvascular ischemic changes present within the periventricular white matter. No definite abnormal foci of restricted diffusion to suggest acute or subacute ischemia. Gray-white matter differentiation maintained. No definite encephalomalacia to suggest chronic infarction. No appreciable foci of susceptibility fact to suggest acute or chronic intracranial hemorrhage. No mass lesion, midline shift or mass effect. Diffuse ventricular prominence related to global parenchymal volume loss  without hydrocephalus. No extra-axial fluid collection. Vascular: Right vertebral artery likely hypoplastic and not well visualized. Major intracranial vascular flow voids otherwise grossly maintained. Skull and upper cervical spine: Craniocervical junction not well evaluated on this motion degraded exam, but grossly unremarkable. Upper cervical spine within normal limits. Bone marrow signal intensity grossly within normal limits. Scalp soft tissues unremarkable. Sinuses/Orbits: Globes and oval soft tissues grossly unremarkable. Patient status post lens extraction bilaterally. Paranasal sinuses grossly clear. No appreciable mastoid effusion. Other: None. IMPRESSION: 1. Motion degraded exam. 2. No acute intracranial abnormality  identified. 3. Moderate age-related cerebral atrophy. Electronically Signed   By: Jeannine Boga M.D.   On: 02/13/2017 16:46     Orlie Dakin, MD 02/13/17 2325

## 2017-02-13 NOTE — ED Notes (Signed)
Pt very hard of hearing, but answers questions appropriately.  States he woke up dizzy this morning with nausea and his daughter gave him a pill and the dizziness is better now.  No neuro deficit noted.

## 2017-02-13 NOTE — ED Notes (Signed)
Attempted to ambulate pt again.  Pt sat on side of bed and reported his head was spinning.  Unable to ambulate.

## 2017-02-13 NOTE — ED Notes (Signed)
Attempted orthostatics on pt.  When pt sat up, states he felt "swimmy headed".  Did not want to stand at this time.

## 2017-02-13 NOTE — ED Triage Notes (Signed)
Pt from home, ems called due to vertigo. Daughter called due to getting him up to bathroom and pt was dizzy and nauseated. Was given meclizine by daughter. HOH. Pt denies sx, steady gait with standing with ems. bs 220. abd distended from chronic abd hernia. nad at this time. Pt baseline mental status per ems from family. Hx of dementia

## 2017-02-13 NOTE — ED Notes (Signed)
Attempted to ambulate pt.  Pt made it to sitting on side of bed.  Began repeating "I'm sick, I'm sick. I feel foolish headed."  Pt laid back in bed.  MD aware.

## 2017-02-13 NOTE — H&P (Signed)
History and Physical    [name] URK:[id number] DOB: April 08, 1925 DOA: 02/13/2017  PCP: Timmothy Euler, MD   Patient coming from: Home  Chief Complaint: Vertigo  HPI: [name] is a 83 y.o. male with medical history significant for type 2 diabetes mellitus, hypertension, Alzheimer dementia, hypothyroidism, and vertigo, now presenting to the emergency department for evaluation of dizziness with nausea upon standing. Patient is accompanied by his wife and daughter who assist with the history. He has a history of vertigo, usually resolves with a dose of meclizine at home when given early in the course, but his daughter suspects that the symptoms began last night, and notes that there was no appreciable improvement with meclizine that was given today at home. Patient reportedly complained of some nausea last night, and then this morning complained of lightheadedness and nausea upon standing to use the restroom. He had to lay back down which helped the symptoms. Every time he has attempted to get up, he has become acutely vertiginous and was severe nausea. There has not been any fall or head injury and the patient has not been voicing any other complaints.  ED Course: Upon arrival to the ED, patient is found to be afebrile, saturating well on room air, and with vital signs otherwise stable. There is a 21 mmHg drop in systolic blood pressure from lying to sitting. Patient was very symptomatic with sitting up in the ED. EKG features a sinus rhythm with nonspecific intraventricular conduction delay. MRI brain is negative for acute intracranial abnormality. Chemistry panel reveals a serum creatinine 1.36 which appears consistent with his baseline. CBC is unremarkable. Urinalysis is notable for glucosuria only. Patient was treated with 500 mL of normal saline in the ED, meclizine, and IV Ativan. Despite this, he remained unable to stand despite 2 person assist. He'll be observed on medical surgical  unit for ongoing evaluation and management of recurrent vertigo with inability to stand despite intensive assistance due to his symptoms.  Review of Systems:  All other systems reviewed and apart from HPI, are negative.  Past Medical History:  Diagnosis Date  . Aortic insufficiency   . Aortic valve sclerosis   . Bronchitis   . CHF (congestive heart failure) ([place])   . Dementia   . Diabetes mellitus    Diet control   . Diverticulosis   . Dyslipidemia   . GERD (gastroesophageal reflux disease)   . Hard of hearing   . Hemorrhoids   . Hernia cerebri ([place])   . History of pneumonia   . Hypertension   . Hypothyroidism   . MI (myocardial infarction) ([place]) 1996   percutaneous coronary intervention   . Small bowel obstruction [facility])     Past Surgical History:  Procedure Laterality Date  . ABDOMINAL HERNIA REPAIR     multiple sx's  . CATARACT EXTRACTION     left eye  . CHOLECYSTECTOMY    . COLONOSCOPY  2008  . EXPLORATORY LAPAROTOMY W/ BOWEL RESECTION     ventral hernia repair  . TRANSURETHRAL RESECTION OF PROSTATE       reports that he quit smoking about 74 years ago. He has never used smokeless tobacco. He reports that he does not drink alcohol or use drugs.  No Known Allergies  Family History  Problem Relation Age of Onset  . Colon cancer Neg Hx      Prior to Admission medications   Medication Sig Start Date End Date Taking? Authorizing Provider  albuterol (PROVENTIL) (  2.5 MG/3ML) 0.083% nebulizer solution Take 3 mLs (2.5 mg total) by nebulization every 6 (six) hours as needed for wheezing or shortness of breath. 07/02/14  Yes Chipper Herb, MD  amLODipine-benazepril (LOTREL) 5-20 MG capsule TAKE (1) CAPSULE DAILY 01/08/17  Yes Claretta Fraise, MD  aspirin 81 MG tablet Take 81 mg by mouth daily.   Yes [provider]  cholecalciferol (VITAMIN D) 1000 units tablet Take 1,000 Units by mouth daily.   Yes [provider]  Diaper Rash Products (DESITIN EX)  Apply 1 application topically daily.   Yes [provider]  docusate sodium (COLACE) 100 MG capsule Take 1 capsule (100 mg total) by mouth at bedtime. 11/01/15  Yes Claretta Fraise, MD  fluticasone (FLONASE) 50 MCG/ACT nasal spray Place 1 spray into both nostrils 2 (two) times daily as needed for allergies or rhinitis. 04/28/16  Yes Dettinger, Fransisca Kaufmann, MD  furosemide (LASIX) 40 MG tablet TAKE (2) TABLETS DAILY 02/12/17  Yes Claretta Fraise, MD  gabapentin (NEURONTIN) 300 MG capsule Take 1 capsule (300 mg total) by mouth at bedtime. 01/30/17  Yes Stacks, Cletus Gash, MD  HYDROcodone-homatropine [facility]) 5-1.5 MG/5ML syrup Take 5 mLs by mouth every 6 (six) hours as needed for cough. 07/24/16  Yes Claretta Fraise, MD  hydrOXYzine (VISTARIL) 25 MG capsule Take 1 capsule (25 mg total) by mouth every 6 (six) hours as needed for anxiety. 06/25/15  Yes Stacks, Cletus Gash, MD  JANUVIA 50 MG tablet TAKE 1 TABLET DAILY 01/15/17  Yes Claretta Fraise, MD  levothyroxine (SYNTHROID, LEVOTHROID) 88 MCG tablet Take 1 tablet (88 mcg total) by mouth daily. 02/12/17  Yes Claretta Fraise, MD  meclizine (ANTIVERT) 25 MG tablet Take 1 tablet (25 mg total) by mouth 3 (three) times daily as needed for dizziness. 07/19/16  Yes Terald Sleeper, PA-C  metoprolol succinate (TOPROL-XL) 25 MG 24 hr tablet TAKE 1 TABLET ONCE DAILY WITH A MEAL 02/12/17  Yes Stacks, Cletus Gash, MD  Natural Senna Laxative 64.8-324 MG TABS Take 1 tablet by mouth at bedtime.    Yes [provider]  Omega-3 Fatty Acids (FISH OIL) 1000 MG CAPS Take 1 capsule by mouth daily.   Yes [provider]  omeprazole (PRILOSEC) 20 MG capsule TAKE (1) CAPSULE DAILY 01/30/17  Yes Claretta Fraise, MD  ondansetron (ZOFRAN) 8 MG tablet Take 1 tablet (8 mg total) by mouth every 8 (eight) hours as needed for nausea or vomiting. 10/17/15  Yes Daleen Bo, MD  potassium chloride (K-DUR,KLOR-CON) 10 MEQ tablet Take 1 tablet 2 (two) times daily. As a potassium supplement 07/10/16   Yes Stacks, Cletus Gash, MD  simvastatin (ZOCOR) 20 MG tablet TAKE 1 TABLET DAILY 01/15/17  Yes Claretta Fraise, MD  traMADol (ULTRAM) 50 MG tablet Take 1 tablet (50 mg total) by mouth 4 (four) times daily as needed for moderate pain. 12/28/15  Yes Claretta Fraise, MD  blood glucose meter kit and supplies KIT Dispense based on patient and insurance. Test bid. E11.9 10/19/14   Chipper Herb, MD  glucose blood test strip Check blood sugar bid. DX- E11.9 10/19/14   Chipper Herb, MD    Physical Exam: Vitals:   02/13/17 1530 02/13/17 1630 02/13/17 1700 02/13/17 1715  BP: 135/69 123/88 113/79   Pulse:  85  76  Resp: _0 Temp:      TempSrc:      SpO2:  93%  95%      Constitutional: NAD, calm, comfortable Eyes: PERTLA, lids and  conjunctivae normal ENMT: Mucous membranes are moist. Posterior pharynx clear of any exudate or lesions.   Neck: normal, supple, no masses, no thyromegaly Respiratory: clear to auscultation bilaterally, no wheezing, no crackles. Normal respiratory effort.   Cardiovascular: S1 & S2 heard, regular rate and rhythm. Trace pretibial edema bilaterally. No significant JVD. Abdomen: No distension, no tenderness, no masses palpated. Bowel sounds normal.  Musculoskeletal: no clubbing / cyanosis. No joint deformity upper and lower extremities.   Skin: no significant rashes, lesions, ulcers. Warm, dry, well-perfused. Neurologic: CN 2-12 grossly intact. Sensation intact. Strength 5/5 in all 4 limbs.  Psychiatric: Alert and oriented to person and place; not oriented to month or year.       Labs on Admission: I have personally reviewed following labs and imaging studies  CBC:  Recent Labs Lab 02/13/17 0950  WBC 6.8  NEUTROABS 4.7  HGB 13.6  HCT 40.0  MCV 87.5  PLT 812   Basic Metabolic Panel:  Recent Labs Lab 02/13/17 0950  NA 136  K 3.6  CL 99*  CO2 27  GLUCOSE 245*  BUN 17  CREATININE 1.36*  CALCIUM 8.7*   GFR: CrCl cannot be calculated (Unknown  ideal weight.). Liver Function Tests: No results for input(s): AST, ALT, ALKPHOS, BILITOT, PROT, ALBUMIN in the last 168 hours. No results for input(s): LIPASE, AMYLASE in the last 168 hours. No results for input(s): AMMONIA in the last 168 hours. Coagulation Profile: No results for input(s): INR, PROTIME in the last 168 hours. Cardiac Enzymes:  Recent Labs Lab 02/13/17 0950  TROPONINI <0.03   BNP (last 3 results) No results for input(s): PROBNP in the last 8760 hours. HbA1C: No results for input(s): HGBA1C in the last 72 hours. CBG: No results for input(s): GLUCAP in the last 168 hours. Lipid Profile: No results for input(s): CHOL, HDL, LDLCALC, TRIG, CHOLHDL, LDLDIRECT in the last 72 hours. Thyroid Function Tests: No results for input(s): TSH, T4TOTAL, FREET4, T3FREE, THYROIDAB in the last 72 hours. Anemia Panel: No results for input(s): VITAMINB12, FOLATE, FERRITIN, TIBC, IRON, RETICCTPCT in the last 72 hours. Urine analysis:    Component Value Date/Time   COLORURINE YELLOW 02/13/2017 0949   APPEARANCEUR CLEAR 02/13/2017 0949   APPEARANCEUR Clear 08/31/2015 1526   LABSPEC 1.014 02/13/2017 0949   PHURINE 6.0 02/13/2017 0949   GLUCOSEU >=500 (A) 02/13/2017 0949   HGBUR NEGATIVE 02/13/2017 0949   BILIRUBINUR NEGATIVE 02/13/2017 0949   BILIRUBINUR Negative 08/31/2015 1526   KETONESUR NEGATIVE 02/13/2017 0949   PROTEINUR NEGATIVE 02/13/2017 0949   UROBILINOGEN 0.2 08/11/2010 1214   NITRITE NEGATIVE 02/13/2017 0949   LEUKOCYTESUR NEGATIVE 02/13/2017 0949   LEUKOCYTESUR Trace (A) 08/31/2015 1526   Sepsis Labs: _0 (procalcitonin:4,lacticidven:4) )No results found for this or any previous visit (from the past 240 hour(s)).   Radiological Exams on Admission: Mr Brain Wo Contrast  Result Date: 02/13/2017 CLINICAL DATA:  Initial evaluation for persistent vertigo. EXAM: MRI HEAD WITHOUT CONTRAST TECHNIQUE: Multiplanar, multiecho pulse sequences of the brain and  surrounding structures were obtained without intravenous contrast. COMPARISON:  Comparison made with prior CT from 10/17/2015. FINDINGS: Brain: Study fairly degraded by motion artifact. Diffuse prominence of the CSF containing spaces compatible with generalized age related cerebral atrophy, moderate nature. Mild chronic microvascular ischemic changes present within the periventricular white matter. No definite abnormal foci of restricted diffusion to suggest acute or subacute ischemia. Gray-white matter differentiation maintained. No definite encephalomalacia to suggest chronic infarction. No appreciable foci of susceptibility fact to suggest acute or  chronic intracranial hemorrhage. No mass lesion, midline shift or mass effect. Diffuse ventricular prominence related to global parenchymal volume loss without hydrocephalus. No extra-axial fluid collection. Vascular: Right vertebral artery likely hypoplastic and not well visualized. Major intracranial vascular flow voids otherwise grossly maintained. Skull and upper cervical spine: Craniocervical junction not well evaluated on this motion degraded exam, but grossly unremarkable. Upper cervical spine within normal limits. Bone marrow signal intensity grossly within normal limits. Scalp soft tissues unremarkable. Sinuses/Orbits: Globes and oval soft tissues grossly unremarkable. Patient status post lens extraction bilaterally. Paranasal sinuses grossly clear. No appreciable mastoid effusion. Other: None. IMPRESSION: 1. Motion degraded exam. 2. No acute intracranial abnormality identified. 3. Moderate age-related cerebral atrophy. Electronically Signed   By: Jeannine Boga M.D.   On: 02/13/2017 16:46    EKG: Independently reviewed. Sinus rhythm, non-specific IVCD.   Assessment/Plan  1. Vertigo  - Pt has hx of vertigo, usually responsive to meclizine at home, but remained symptomatic and unable to sit up despite taking meclizine today  - MRI brain is  negative  - Pt remains unable to stand in ED after IVF, repeat meclizine, and Ativan  - Continue symptomatic treatment with antihistamines, antiemetics, benzodiazepines - PT asked to eval and treat - Orthostasis may be contributing and will be addressed as below    2. CAD  - No anginal complaints  - Continue ASA, Zocor, Toprol, and benazepril    3. CKD stage III  - SCr is 1.36 on admission, consistent with his apparent baseline  - Avoid hypotension or dehydration  - He is receiving NS infusion and chem panel will be repeated in am   4. Type II DM  - A1c was 6.8% in Feb '17  - Managed at home with Januvia, held on admission  - Check CBG with meals and qHS  - Start a low-intensity SSI with Novolog   5. Hypertension  - BP is at goal  - Continue Toprol, amlodipine-benazepril as tolerated   6. Hypothyroidism  - Continue Synthroid   7. Orthostasis - 21 mm Hg drop in SBP from laying to sitting, accompanied by severe lightheadedness and nausea - He was given a 500 cc NS bolus in ED and will be continued on gentle IVF hydration overnight  - Hold Lasix for now, repeat orthostatic vitals tomorrow    DVT prophylaxis: sq heparin  Code Status: Full  Family Communication: Wife and daughter updated at bedside  Disposition Plan: Observe on med-surg Consults called: None Admission status: Observation    Vianne Bulls, MD [facility] Pager [phone]  If 7PM-7AM, please contact night-coverage [email or web address] Password [facility]  02/13/2017, 5:53 PM

## 2017-02-14 DIAGNOSIS — E876 Hypokalemia: Secondary | ICD-10-CM

## 2017-02-14 DIAGNOSIS — N183 Chronic kidney disease, stage 3 (moderate): Secondary | ICD-10-CM | POA: Diagnosis not present

## 2017-02-14 DIAGNOSIS — R42 Dizziness and giddiness: Principal | ICD-10-CM

## 2017-02-14 DIAGNOSIS — I951 Orthostatic hypotension: Secondary | ICD-10-CM

## 2017-02-14 LAB — BASIC METABOLIC PANEL
Anion gap: 6 (ref 5–15)
BUN: 15 mg/dL (ref 6–20)
CALCIUM: 8.3 mg/dL — AB (ref 8.9–10.3)
CO2: 26 mmol/L (ref 22–32)
CREATININE: 1.16 mg/dL (ref 0.61–1.24)
Chloride: 106 mmol/L (ref 101–111)
GFR calc non Af Amer: 54 mL/min — ABNORMAL LOW (ref >60)
Glucose, Bld: 176 mg/dL — ABNORMAL HIGH (ref 65–99)
Potassium: 3.4 mmol/L — ABNORMAL LOW (ref 3.5–5.1)
SODIUM: 138 mmol/L (ref 135–145)

## 2017-02-14 LAB — GLUCOSE, CAPILLARY
GLUCOSE-CAPILLARY: 167 mg/dL — AB (ref 65–99)
GLUCOSE-CAPILLARY: 225 mg/dL — AB (ref 65–99)

## 2017-02-14 LAB — CBC
HCT: 37.5 % — ABNORMAL LOW (ref 39.0–52.0)
HEMOGLOBIN: 12.4 g/dL — AB (ref 13.0–17.0)
MCH: 29 pg (ref 26.0–34.0)
MCHC: 33.1 g/dL (ref 30.0–36.0)
MCV: 87.8 fL (ref 78.0–100.0)
PLATELETS: 172 10*3/uL (ref 150–400)
RBC: 4.27 MIL/uL (ref 4.22–5.81)
RDW: 13.6 % (ref 11.5–15.5)
WBC: 6.7 10*3/uL (ref 4.0–10.5)

## 2017-02-14 NOTE — Progress Notes (Signed)
Patient discharged home.  IV removed - WNL.  Reviewed DC instructions and medications with patient and patients daughter.  Instructed to follow up with PCP. Verbalizes understanding..  Assisted off unit in NAD

## 2017-02-14 NOTE — Progress Notes (Signed)
Pt hasn't urinated throughout night, refused to try last night and this morning. Had to perform in & out cath to retrieve urine. Pt also refused to have temperature taken, orthostatic vitals, and heparin injection this a.m.

## 2017-02-14 NOTE — Discharge Summary (Signed)
Physician Discharge Summary  [name] DHR:[id number] DOB: 02-14-1925 DOA: 02/13/2017  PCP: Timmothy Euler, MD  Admit date: 02/13/2017 Discharge date: 02/14/2017  Admitted From: home Disposition:  Home with HHPT  Recommendations for Outpatient Follow-up:  1. Follow up with PCP in 1 week.  Home Health:PT Equipment/Devices: Environmental consultant  Discharge Condition: Fair CODE STATUS: Full code Diet recommendation: Heart Healthy / Carb Modified     Discharge Diagnoses:  Principal Problem:   Vertigo  Active Problems:   Orthostasis   Diabetes mellitus type II, non insulin dependent (HCC)   Essential hypertension   CORONARY ATHEROSCLEROSIS NATIVE CORONARY ARTERY   Hypothyroid   Chronic back pain   Alzheimer's dementia   CKD (chronic kidney disease), stage III   Chronic diastolic CHF (congestive heart failure) (HCC)   Hypokalemia  Brief narrative/history of present illness 83 year old male with history of type 2 diabetes mellitus, hypertension, Alzheimer's dementia, hypothyroidism, vertigo presented to the ED with dizziness and nausea on standing. History was mainly provided by patient's wife and daughter as patient is very hard of hearing. Patient has history of vertigo which responds to dose of meclizine. However on the day of admission his symptoms did not improve when standing to use the restroom and was associated with nausea. He lied back in the bed which helped his symptoms. His symptoms reoccurred every time he tried to get up. Family denies any vomiting, recent fall, fevers, chills, headache, blurred vision, chest pain, palpitations, shortness of breath, abdominal pain, bowel or urinary symptoms. In the ED vitals were stable except for drop in systolic blood pressure of 21 mmHg. He was also asymptomatic on sitting up in the ED. EKG was negative for new findings. MRI brain also negative for acute intracranial abnormality. Blood work showed creatinine 1.36 which is around baseline.  CBC was unremarkable. UA was negative for infection. He received 500 mL normal saline bolus in the ED along with IV Ativan and meclizine despite which he was unable to stand without 2 person assist. Patient placed on observation.   Hospital course  Principal problem Vertigo with orthostasis. Likely combination of his underlying vertigo with orthostatic hypotension from dehydration. MRI brain negative. Blood pressure now improved with IV fluids. Repeat orthostasis this morning negative. Seen by PT and recommends home health PT with 24-hour supervision. As per wife and daughter patient does not hydrate himself well and was encouraged to hydrate himself well. -Continue meclizine.  I'm holding his amlodipine-benazepril for now until he sees his PCP as outpatient and the blood pressure stable and no further orthostatic blood pressure.  Coronary artery disease No symptoms. Continue aspirin, Zocor and Toprol.  Abdominal distention?? Per family patient has history of hernia repair which has caused him chronic abdominal distention and occasional fluid buildup for which he has had fluid removed. Is also on daily Lasix which will be continued. Recommend further management per PCP.  CK D stage III Renal function around baseline on presentation. Improved with IV fluids.  Type 2 diabetes mellitus CBG stable. Resume home dose and nausea.  Essential hypertension Continue Toprol. Holding amlodipine-benazepril for orthostasis.  Hypothyroidism  Continue Synthroid.  Chronic pain On tramadol which is resumed.    Discharge Instructions   Allergies as of 02/14/2017   No Known Allergies     Medication List    STOP taking these medications   amLODipine-benazepril 5-20 MG capsule Commonly known as:  LOTREL     TAKE these medications   albuterol (2.5 MG/3ML) 0.083% nebulizer solution  Commonly known as:  PROVENTIL Take 3 mLs (2.5 mg total) by nebulization every 6 (six) hours as needed for  wheezing or shortness of breath.   aspirin 81 MG tablet Take 81 mg by mouth daily.   blood glucose meter kit and supplies Kit Dispense based on patient and insurance. Test bid. E11.9   cholecalciferol 1000 units tablet Commonly known as:  VITAMIN D Take 1,000 Units by mouth daily.   DESITIN EX Apply 1 application topically daily.   docusate sodium 100 MG capsule Commonly known as:  COLACE Take 1 capsule (100 mg total) by mouth at bedtime.   Fish Oil 1000 MG Caps Take 1 capsule by mouth daily.   fluticasone 50 MCG/ACT nasal spray Commonly known as:  FLONASE Place 1 spray into both nostrils 2 (two) times daily as needed for allergies or rhinitis.   furosemide 40 MG tablet Commonly known as:  LASIX TAKE (2) TABLETS DAILY   gabapentin 300 MG capsule Commonly known as:  NEURONTIN Take 1 capsule (300 mg total) by mouth at bedtime.   glucose blood test strip Check blood sugar bid. DX- E11.9   HYDROcodone-homatropine 5-1.5 MG/5ML syrup Commonly known as:  HYCODAN Take 5 mLs by mouth every 6 (six) hours as needed for cough.   hydrOXYzine 25 MG capsule Commonly known as:  VISTARIL Take 1 capsule (25 mg total) by mouth every 6 (six) hours as needed for anxiety.   JANUVIA 50 MG tablet Generic drug:  sitaGLIPtin TAKE 1 TABLET DAILY   levothyroxine 88 MCG tablet Commonly known as:  SYNTHROID, LEVOTHROID Take 1 tablet (88 mcg total) by mouth daily.   meclizine 25 MG tablet Commonly known as:  ANTIVERT Take 1 tablet (25 mg total) by mouth 3 (three) times daily as needed for dizziness.   metoprolol succinate 25 MG 24 hr tablet Commonly known as:  TOPROL-XL TAKE 1 TABLET ONCE DAILY WITH A MEAL   NATURAL SENNA LAXATIVE 64.8-324 MG Tabs Take 1 tablet by mouth at bedtime.   omeprazole 20 MG capsule Commonly known as:  PRILOSEC TAKE (1) CAPSULE DAILY   ondansetron 8 MG tablet Commonly known as:  ZOFRAN Take 1 tablet (8 mg total) by mouth every 8 (eight) hours as needed  for nausea or vomiting.   potassium chloride 10 MEQ tablet Commonly known as:  K-DUR,KLOR-CON Take 1 tablet 2 (two) times daily. As a potassium supplement   simvastatin 20 MG tablet Commonly known as:  ZOCOR TAKE 1 TABLET DAILY   traMADol 50 MG tablet Commonly known as:  ULTRAM Take 1 tablet (50 mg total) by mouth 4 (four) times daily as needed for moderate pain.       No Known Allergies      Procedures/Studies: Mr Brain Wo Contrast  Result Date: 02/13/2017 CLINICAL DATA:  Initial evaluation for persistent vertigo. EXAM: MRI HEAD WITHOUT CONTRAST TECHNIQUE: Multiplanar, multiecho pulse sequences of the brain and surrounding structures were obtained without intravenous contrast. COMPARISON:  Comparison made with prior CT from 10/17/2015. FINDINGS: Brain: Study fairly degraded by motion artifact. Diffuse prominence of the CSF containing spaces compatible with generalized age related cerebral atrophy, moderate nature. Mild chronic microvascular ischemic changes present within the periventricular white matter. No definite abnormal foci of restricted diffusion to suggest acute or subacute ischemia. Gray-white matter differentiation maintained. No definite encephalomalacia to suggest chronic infarction. No appreciable foci of susceptibility fact to suggest acute or chronic intracranial hemorrhage. No mass lesion, midline shift or mass effect. Diffuse ventricular prominence related  to global parenchymal volume loss without hydrocephalus. No extra-axial fluid collection. Vascular: Right vertebral artery likely hypoplastic and not well visualized. Major intracranial vascular flow voids otherwise grossly maintained. Skull and upper cervical spine: Craniocervical junction not well evaluated on this motion degraded exam, but grossly unremarkable. Upper cervical spine within normal limits. Bone marrow signal intensity grossly within normal limits. Scalp soft tissues unremarkable. Sinuses/Orbits: Globes  and oval soft tissues grossly unremarkable. Patient status post lens extraction bilaterally. Paranasal sinuses grossly clear. No appreciable mastoid effusion. Other: None. IMPRESSION: 1. Motion degraded exam. 2. No acute intracranial abnormality identified. 3. Moderate age-related cerebral atrophy. Electronically Signed   By: Jeannine Boga M.D.   On: 02/13/2017 16:46       Subjective: Denies further dizziness. Was able to get up with therapy without having any symptoms today.  Discharge Exam: Vitals:   02/13/17 2040 02/14/17 0536  BP: (!) 123/57 139/64  Pulse: 78 97  Resp: 14 12  Temp: 97.7 F (36.5 C)   SpO2: 96% 98%   Vitals:   02/13/17 1950 02/13/17 2040 02/14/17 0500 02/14/17 0536  BP:  (!) 123/57  139/64  Pulse: 87 78  97  Resp:  14  12  Temp:  97.7 F (36.5 C)    TempSrc:  Oral    SpO2: 95% 96%  98%  Weight:  93.2 kg (205 lb 8 oz) 93.3 kg (205 lb 11.2 oz)     General: Elderly male hard of hearing, not in distress HEENT: Moist mucosa, supple neck Chest: Clear bilaterally, no added sounds  CVS: S1 and S2 irregular, systolic murmur 2/6 GI: Soft, distended, nontender, bowel sounds present Musculoskeletal: Warm, no edema CNS: Alert and oriented, hard of hearing,    The results of significant diagnostics from this hospitalization (including imaging, microbiology, ancillary and laboratory) are listed below for reference.     Microbiology: No results found for this or any previous visit (from the past 240 hour(s)).   Labs: BNP (last 3 results) No results for input(s): BNP in the last 8760 hours. Basic Metabolic Panel:  Recent Labs Lab 02/13/17 0950 02/14/17 0640  NA 136 138  K 3.6 3.4*  CL 99* 106  CO2 27 26  GLUCOSE 245* 176*  BUN 17 15  CREATININE 1.36* 1.16  CALCIUM 8.7* 8.3*   Liver Function Tests: No results for input(s): AST, ALT, ALKPHOS, BILITOT, PROT, ALBUMIN in the last 168 hours. No results for input(s): LIPASE, AMYLASE in the last  168 hours. No results for input(s): AMMONIA in the last 168 hours. CBC:  Recent Labs Lab 02/13/17 0950 02/14/17 0640  WBC 6.8 6.7  NEUTROABS 4.7  --   HGB 13.6 12.4*  HCT 40.0 37.5*  MCV 87.5 87.8  PLT 175 172   Cardiac Enzymes:  Recent Labs Lab 02/13/17 0950  TROPONINI <0.03   BNP: Invalid input(s): POCBNP CBG:  Recent Labs Lab 02/13/17 2055 02/14/17 0721 02/14/17 1120  GLUCAP 208* 167* 225*   D-Dimer No results for input(s): DDIMER in the last 72 hours. Hgb A1c No results for input(s): HGBA1C in the last 72 hours. Lipid Profile No results for input(s): CHOL, HDL, LDLCALC, TRIG, CHOLHDL, LDLDIRECT in the last 72 hours. Thyroid function studies No results for input(s): TSH, T4TOTAL, T3FREE, THYROIDAB in the last 72 hours.  Invalid input(s): FREET3 Anemia work up No results for input(s): VITAMINB12, FOLATE, FERRITIN, TIBC, IRON, RETICCTPCT in the last 72 hours. Urinalysis    Component Value Date/Time   COLORURINE YELLOW 02/13/2017  [place] 02/13/2017 0949   APPEARANCEUR Clear 08/31/2015 1526   LABSPEC 1.014 02/13/2017 0949   PHURINE 6.0 02/13/2017 0949   GLUCOSEU >=500 (A) 02/13/2017 0949   HGBUR NEGATIVE 02/13/2017 0949   BILIRUBINUR NEGATIVE 02/13/2017 0949   BILIRUBINUR Negative 08/31/2015 1526   KETONESUR NEGATIVE 02/13/2017 0949   PROTEINUR NEGATIVE 02/13/2017 0949   UROBILINOGEN 0.2 08/11/2010 1214   NITRITE NEGATIVE 02/13/2017 0949   LEUKOCYTESUR NEGATIVE 02/13/2017 0949   LEUKOCYTESUR Trace (A) 08/31/2015 1526   Sepsis Labs Invalid input(s): PROCALCITONIN,  WBC,  LACTICIDVEN Microbiology No results found for this or any previous visit (from the past 240 hour(s)).   Time coordinating discharge: <30 minutes  SIGNED:   Louellen Molder, MD  [facility] 02/14/2017, 11:37 AM Pager   If 7PM-7AM, please contact night-coverage [email or web address] Password [email or web address]

## 2017-02-14 NOTE — Care Management Note (Signed)
Case Management Note  Patient Details  Name: [name] MRN: [id number] Date of Birth: 16-Feb-1925  Subjective/Objective:                  Admitted with vertigo. Pt is from home, lives with wife. Pt's daughter is his aid. He uses walker with ambulation. PT has recommended HH PT. Pt active with [facility] pta. Pt's dtr would like to cont services with them.   Action/Plan: DC home today with cont of HH. Vaughan Basta [facility] rep aware of obs stay and addition of PT to [facility] orders. Will obtain info from epic.   Expected Discharge Date:      02/14/2017            Expected Discharge Plan:  [place]  In-House Referral:  NA  Discharge planning Services  CM Consult  Post Acute Care Choice:  Home Health Choice offered to:  Patient, Spouse, Adult Children  HH Arranged:  RN, PT [facility] Agency:  [place]  Status of Service:  Completed, signed off  Sherald Barge, RN 02/14/2017, 12:54 PM

## 2017-02-14 NOTE — Evaluation (Signed)
Physical Therapy Evaluation Patient Details Name: [name] MRN: [id number] DOB: 06-14-1924 Today's Date: 02/14/2017   History of Present Illness  [name] is a 83 y.o. male with medical history significant for type 2 diabetes mellitus, hypertension, Alzheimer dementia, hypothyroidism, and vertigo, now presenting to the emergency department for evaluation of dizziness with nausea upon standing. Patient is accompanied by his wife and daughter who assist with the history. He has a history of vertigo, usually resolves with a dose of meclizine at home when given early in the course, but his daughter suspects that the symptoms began last night, and notes that there was no appreciable improvement with meclizine that was given today at home. Patient reportedly complained of some nausea last night, and then this morning complained of lightheadedness and nausea upon standing to use the restroom. He had to lay back down which helped the symptoms. Every time he has attempted to get up, he has become acutely vertiginous and was severe nausea. There has not been any fall or head injury and the patient has not been voicing any other complaints.    Clinical Impression  Patient limited for functional mobility as stated below secondary to BLE weakness, fatigue and poor standing balance.  Patient will benefit from continued physical therapy in hospital and recommended venue below to increase strength, balance, endurance for safe ADLs and gait.    Follow Up Recommendations Home health PT;Supervision/Assistance - 24 hour    Equipment Recommendations  None recommended by PT    Recommendations for Other Services       Precautions / Restrictions Precautions Precautions: Fall Restrictions Weight Bearing Restrictions: No      Mobility  Bed Mobility Overal bed mobility: Needs Assistance Bed Mobility: Supine to Sit;Sit to Supine     Supine to sit: Min guard Sit to supine: Min guard       Transfers Overall transfer level: Needs assistance Equipment used: Rolling walker (2 wheeled) Transfers: Sit to/from [facility] Sit to Stand: Min assist Stand pivot transfers: Min assist          Ambulation/Gait Ambulation/Gait assistance: Min assist Ambulation Distance (Feet): 12 Feet Assistive device: Rolling walker (2 wheeled) Gait Pattern/deviations: Decreased step length - right;Decreased step length - left;Decreased stride length   Gait velocity interpretation: Below normal speed for age/gender General Gait Details: demonstrates slow unsteady cadence without loss of balance  Stairs            Wheelchair Mobility    Modified Rankin (Stroke Patients Only)       Balance Overall balance assessment: Needs assistance Sitting-balance support: No upper extremity supported;Feet supported Sitting balance-Leahy Scale: Good     Standing balance support: Bilateral upper extremity supported;During functional activity Standing balance-Leahy Scale: Fair                               Pertinent Vitals/Pain Pain Assessment: No/denies pain    Home Living Family/patient expects to be discharged to:: Private residence Living Arrangements: Spouse/significant other Available Help at Discharge: Family Type of Home: House Home Access: Level entry     Home Layout: One level Home Equipment: Environmental consultant - 2 wheels;Cane - single point;Bedside commode;Shower seat;Wheelchair - manual      Prior Function Level of Independence: Independent with assistive device(s)               Hand Dominance        Extremity/Trunk Assessment  Upper Extremity Assessment Upper Extremity Assessment: Generalized weakness    Lower Extremity Assessment Lower Extremity Assessment: Generalized weakness    Cervical / Trunk Assessment Cervical / Trunk Assessment: Kyphotic  Communication   Communication: HOH  Cognition Arousal/Alertness:  Awake/alert Behavior During Therapy: WFL for tasks assessed/performed Overall Cognitive Status: History of cognitive impairments - at baseline                                 General Comments: Patient very hard of hearing, follows directions consistently      General Comments      Exercises     Assessment/Plan    PT Assessment Patient needs continued PT services  PT Problem List Decreased strength;Decreased activity tolerance;Decreased balance;Decreased mobility       PT Treatment Interventions Gait training;Functional mobility training;Therapeutic activities;Therapeutic exercise;Patient/family education    PT Goals (Current goals can be found in the Care Plan section)  Acute Rehab PT Goals Patient Stated Goal: return home with family to assist PT Goal Formulation: With patient/family Time For Goal Achievement: 02/21/17 Potential to Achieve Goals: Good    Frequency Min 3X/week   Barriers to discharge        Co-evaluation               AM-PAC PT "6 Clicks" Daily Activity  Outcome Measure Difficulty turning over in bed (including adjusting bedclothes, sheets and blankets)?: Unable Difficulty moving from lying on back to sitting on the side of the bed? : Unable Difficulty sitting down on and standing up from a chair with arms (e.g., wheelchair, bedside commode, etc,.)?: Unable Help needed moving to and from a bed to chair (including a wheelchair)?: A Little Help needed walking in hospital room?: A Little Help needed climbing 3-5 steps with a railing? : A Lot 6 Click Score: 11    End of Session Equipment Utilized During Treatment: Gait belt Activity Tolerance: Patient limited by fatigue Patient left: in chair;with call bell/phone within reach Nurse Communication: Mobility status PT Visit Diagnosis: Unsteadiness on feet (R26.81);Other abnormalities of gait and mobility (R26.89);Muscle weakness (generalized) (M62.81)    Time: 1017-5102 PT Time  Calculation (min) (ACUTE ONLY): 26 min   Charges:   PT Evaluation $PT Eval Low Complexity: 1 Low PT Treatments $Therapeutic Activity: 23-37 mins   PT G Codes:   PT G-Codes **NOT FOR INPATIENT CLASS** Functional Assessment Tool Used: AM-PAC 6 Clicks Basic Mobility Functional Limitation: Mobility: Walking and moving around Mobility: Walking and Moving Around Current Status (H8527): At least 60 percent but less than 80 percent impaired, limited or restricted Mobility: Walking and Moving Around Goal Status [phone]): At least 60 percent but less than 80 percent impaired, limited or restricted Mobility: Walking and Moving Around Discharge Status [phone]): At least 60 percent but less than 80 percent impaired, limited or restricted    11:14 AM, 02/14/17 Lonell Grandchild, MPT Physical Therapist with [facility] [phone] office [phone] mobile phone

## 2017-02-14 NOTE — Discharge Instructions (Signed)

## 2017-02-16 ENCOUNTER — Ambulatory Visit: Payer: Medicare HMO | Admitting: Family Medicine

## 2017-02-16 ENCOUNTER — Emergency Department (HOSPITAL_COMMUNITY): Payer: Medicare HMO

## 2017-02-16 ENCOUNTER — Observation Stay (HOSPITAL_COMMUNITY)
Admission: EM | Admit: 2017-02-16 | Discharge: 2017-02-19 | Disposition: A | Discharge status: Home / Self Care | Payer: Medicare HMO | Attending: Internal Medicine | Admitting: Internal Medicine

## 2017-02-16 ENCOUNTER — Encounter (HOSPITAL_COMMUNITY): Payer: Self-pay | Admitting: Emergency Medicine

## 2017-02-16 ENCOUNTER — Other Ambulatory Visit: Payer: Self-pay

## 2017-02-16 DIAGNOSIS — I10 Essential (primary) hypertension: Secondary | ICD-10-CM | POA: Diagnosis present

## 2017-02-16 DIAGNOSIS — Z23 Encounter for immunization: Secondary | ICD-10-CM | POA: Insufficient documentation

## 2017-02-16 DIAGNOSIS — Z87891 Personal history of nicotine dependence: Secondary | ICD-10-CM | POA: Diagnosis not present

## 2017-02-16 DIAGNOSIS — K5730 Diverticulosis of large intestine without perforation or abscess without bleeding: Secondary | ICD-10-CM | POA: Diagnosis not present

## 2017-02-16 DIAGNOSIS — E039 Hypothyroidism, unspecified: Secondary | ICD-10-CM | POA: Diagnosis not present

## 2017-02-16 DIAGNOSIS — G308 Other Alzheimer's disease: Secondary | ICD-10-CM | POA: Insufficient documentation

## 2017-02-16 DIAGNOSIS — E785 Hyperlipidemia, unspecified: Secondary | ICD-10-CM | POA: Diagnosis present

## 2017-02-16 DIAGNOSIS — Z7982 Long term (current) use of aspirin: Secondary | ICD-10-CM | POA: Insufficient documentation

## 2017-02-16 DIAGNOSIS — Z7984 Long term (current) use of oral hypoglycemic drugs: Secondary | ICD-10-CM | POA: Diagnosis not present

## 2017-02-16 DIAGNOSIS — E876 Hypokalemia: Secondary | ICD-10-CM | POA: Diagnosis not present

## 2017-02-16 DIAGNOSIS — R404 Transient alteration of awareness: Secondary | ICD-10-CM | POA: Diagnosis not present

## 2017-02-16 DIAGNOSIS — N183 Chronic kidney disease, stage 3 unspecified: Secondary | ICD-10-CM | POA: Diagnosis present

## 2017-02-16 DIAGNOSIS — I5032 Chronic diastolic (congestive) heart failure: Secondary | ICD-10-CM | POA: Diagnosis not present

## 2017-02-16 DIAGNOSIS — R4182 Altered mental status, unspecified: Secondary | ICD-10-CM | POA: Diagnosis not present

## 2017-02-16 DIAGNOSIS — L8990 Pressure ulcer of unspecified site, unspecified stage: Secondary | ICD-10-CM | POA: Insufficient documentation

## 2017-02-16 DIAGNOSIS — I130 Hypertensive heart and chronic kidney disease with heart failure and stage 1 through stage 4 chronic kidney disease, or unspecified chronic kidney disease: Secondary | ICD-10-CM | POA: Diagnosis not present

## 2017-02-16 DIAGNOSIS — R509 Fever, unspecified: Secondary | ICD-10-CM | POA: Diagnosis not present

## 2017-02-16 DIAGNOSIS — R531 Weakness: Secondary | ICD-10-CM | POA: Diagnosis not present

## 2017-02-16 DIAGNOSIS — E860 Dehydration: Secondary | ICD-10-CM | POA: Diagnosis not present

## 2017-02-16 DIAGNOSIS — N3001 Acute cystitis with hematuria: Principal | ICD-10-CM | POA: Insufficient documentation

## 2017-02-16 DIAGNOSIS — I2510 Atherosclerotic heart disease of native coronary artery without angina pectoris: Secondary | ICD-10-CM | POA: Insufficient documentation

## 2017-02-16 DIAGNOSIS — R0602 Shortness of breath: Secondary | ICD-10-CM

## 2017-02-16 DIAGNOSIS — N390 Urinary tract infection, site not specified: Secondary | ICD-10-CM | POA: Diagnosis present

## 2017-02-16 DIAGNOSIS — E119 Type 2 diabetes mellitus without complications: Secondary | ICD-10-CM | POA: Diagnosis not present

## 2017-02-16 LAB — URINALYSIS, ROUTINE W REFLEX MICROSCOPIC
Bilirubin Urine: NEGATIVE
Glucose, UA: >=500 mg/dL — AB
KETONES UR: NEGATIVE mg/dL
Nitrite: POSITIVE — AB
PROTEIN: 100 mg/dL — AB
Specific Gravity, Urine: 1.027 (ref 1.005–1.030)
pH: 8 (ref 5.0–8.0)

## 2017-02-16 LAB — TROPONIN I

## 2017-02-16 LAB — CBC WITH DIFFERENTIAL/PLATELET
BASOS ABS: 0 10*3/uL (ref 0.0–0.1)
BASOS PCT: 0 %
EOS PCT: 0 %
Eosinophils Absolute: 0 10*3/uL (ref 0.0–0.7)
HCT: 38.8 % — ABNORMAL LOW (ref 39.0–52.0)
Hemoglobin: 13.2 g/dL (ref 13.0–17.0)
Lymphocytes Relative: 11 %
Lymphs Abs: 1.7 10*3/uL (ref 0.7–4.0)
MCH: 29.5 pg (ref 26.0–34.0)
MCHC: 34 g/dL (ref 30.0–36.0)
MCV: 86.8 fL (ref 78.0–100.0)
MONO ABS: 1.3 10*3/uL — AB (ref 0.1–1.0)
MONOS PCT: 9 %
Neutro Abs: 12.2 10*3/uL — ABNORMAL HIGH (ref 1.7–7.7)
Neutrophils Relative %: 80 %
Platelets: 165 10*3/uL (ref 150–400)
RBC: 4.47 MIL/uL (ref 4.22–5.81)
RDW: 13.5 % (ref 11.5–15.5)
WBC: 15.2 10*3/uL — ABNORMAL HIGH (ref 4.0–10.5)

## 2017-02-16 LAB — I-STAT CG4 LACTIC ACID, ED: LACTIC ACID, VENOUS: 1.19 mmol/L (ref 0.5–1.9)

## 2017-02-16 LAB — COMPREHENSIVE METABOLIC PANEL
ALBUMIN: 3.3 g/dL — AB (ref 3.5–5.0)
ALT: 25 U/L (ref 17–63)
AST: 19 U/L (ref 15–41)
Alkaline Phosphatase: 66 U/L (ref 38–126)
Anion gap: 7 (ref 5–15)
BILIRUBIN TOTAL: 1.1 mg/dL (ref 0.3–1.2)
BUN: 14 mg/dL (ref 6–20)
CO2: 26 mmol/L (ref 22–32)
CREATININE: 1.32 mg/dL — AB (ref 0.61–1.24)
Calcium: 8.7 mg/dL — ABNORMAL LOW (ref 8.9–10.3)
Chloride: 99 mmol/L — ABNORMAL LOW (ref 101–111)
GFR calc non Af Amer: 46 mL/min — ABNORMAL LOW (ref >60)
GFR, EST AFRICAN AMERICAN: 53 mL/min — AB (ref >60)
Glucose, Bld: 237 mg/dL — ABNORMAL HIGH (ref 65–99)
POTASSIUM: 3.4 mmol/L — AB (ref 3.5–5.1)
Sodium: 132 mmol/L — ABNORMAL LOW (ref 135–145)
TOTAL PROTEIN: 6.6 g/dL (ref 6.5–8.1)

## 2017-02-16 LAB — BRAIN NATRIURETIC PEPTIDE: B Natriuretic Peptide: 89 pg/mL (ref 0.0–100.0)

## 2017-02-16 LAB — GLUCOSE, CAPILLARY: GLUCOSE-CAPILLARY: 197 mg/dL — AB (ref 65–99)

## 2017-02-16 LAB — LIPASE, BLOOD: LIPASE: 20 U/L (ref 11–51)

## 2017-02-16 MED ORDER — SODIUM CHLORIDE 0.9 % IV BOLUS (SEPSIS)
500.0000 mL | Freq: Once | INTRAVENOUS | Status: AC
  Administered 2017-02-16: 500 mL via INTRAVENOUS

## 2017-02-16 MED ORDER — LINAGLIPTIN 5 MG PO TABS
5.0000 mg | ORAL_TABLET | Freq: Every day | ORAL | Status: DC
  Administered 2017-02-16 – 2017-02-19 (×4): 5 mg via ORAL
  Filled 2017-02-16 (×4): qty 1

## 2017-02-16 MED ORDER — FLUTICASONE PROPIONATE 50 MCG/ACT NA SUSP: 1.0000 | Freq: Two times a day (BID) | NASAL | Status: DC | PRN

## 2017-02-16 MED ORDER — ONDANSETRON HCL 4 MG PO TABS: 4.0000 mg | ORAL_TABLET | Freq: Four times a day (QID) | ORAL | Status: DC | PRN

## 2017-02-16 MED ORDER — ACETAMINOPHEN 325 MG PO TABS: 650.0000 mg | ORAL_TABLET | Freq: Four times a day (QID) | ORAL | Status: DC | PRN

## 2017-02-16 MED ORDER — GABAPENTIN 300 MG PO CAPS
300.0000 mg | ORAL_CAPSULE | Freq: Every day | ORAL | Status: DC
  Administered 2017-02-16 – 2017-02-18 (×3): 300 mg via ORAL
  Filled 2017-02-16 (×3): qty 1

## 2017-02-16 MED ORDER — LEVOTHYROXINE SODIUM 88 MCG PO TABS
88.0000 ug | ORAL_TABLET | Freq: Every day | ORAL | Status: DC
  Administered 2017-02-17 – 2017-02-19 (×3): 88 ug via ORAL
  Filled 2017-02-16 (×3): qty 1

## 2017-02-16 MED ORDER — DOCUSATE SODIUM 100 MG PO CAPS
100.0000 mg | ORAL_CAPSULE | Freq: Every day | ORAL | Status: DC
  Administered 2017-02-16 – 2017-02-17 (×2): 100 mg via ORAL
  Filled 2017-02-16 (×2): qty 1

## 2017-02-16 MED ORDER — PANTOPRAZOLE SODIUM 40 MG PO TBEC
40.0000 mg | DELAYED_RELEASE_TABLET | Freq: Every day | ORAL | Status: DC
  Administered 2017-02-16 – 2017-02-19 (×4): 40 mg via ORAL
  Filled 2017-02-16 (×4): qty 1

## 2017-02-16 MED ORDER — INSULIN ASPART 100 UNIT/ML ~~LOC~~ SOLN: 0.0000 [IU] | Freq: Every day | SUBCUTANEOUS | Status: DC

## 2017-02-16 MED ORDER — DEXTROSE 5 % IV SOLN
1.0000 g | Freq: Once | INTRAVENOUS | Status: AC
  Administered 2017-02-16: 1 g via INTRAVENOUS
  Filled 2017-02-16: qty 10

## 2017-02-16 MED ORDER — POTASSIUM CHLORIDE CRYS ER 10 MEQ PO TBCR
10.0000 meq | EXTENDED_RELEASE_TABLET | Freq: Two times a day (BID) | ORAL | Status: DC
  Administered 2017-02-17 – 2017-02-19 (×5): 10 meq via ORAL
  Filled 2017-02-16 (×6): qty 1

## 2017-02-16 MED ORDER — IOPAMIDOL (ISOVUE-300) INJECTION 61%
80.0000 mL | Freq: Once | INTRAVENOUS | Status: AC | PRN
  Administered 2017-02-16: 80 mL via INTRAVENOUS

## 2017-02-16 MED ORDER — DEXTROSE 5 % IV SOLN
1.0000 g | INTRAVENOUS | Status: DC
  Administered 2017-02-17 – 2017-02-19 (×3): 1 g via INTRAVENOUS
  Filled 2017-02-16 (×4): qty 10

## 2017-02-16 MED ORDER — ACETAMINOPHEN 650 MG RE SUPP: 650.0000 mg | Freq: Four times a day (QID) | RECTAL | Status: DC | PRN

## 2017-02-16 MED ORDER — SIMVASTATIN 20 MG PO TABS
20.0000 mg | ORAL_TABLET | Freq: Every day | ORAL | Status: DC
  Administered 2017-02-16 – 2017-02-18 (×3): 20 mg via ORAL
  Filled 2017-02-16 (×3): qty 1

## 2017-02-16 MED ORDER — INSULIN ASPART 100 UNIT/ML ~~LOC~~ SOLN
0.0000 [IU] | Freq: Three times a day (TID) | SUBCUTANEOUS | Status: DC
  Administered 2017-02-17 – 2017-02-18 (×4): 3 [IU] via SUBCUTANEOUS
  Administered 2017-02-18 – 2017-02-19 (×4): 2 [IU] via SUBCUTANEOUS

## 2017-02-16 MED ORDER — HYDROXYZINE HCL 25 MG PO TABS: 25.0000 mg | ORAL_TABLET | Freq: Four times a day (QID) | ORAL | Status: DC | PRN

## 2017-02-16 MED ORDER — ONDANSETRON HCL 4 MG/2ML IJ SOLN: 4.0000 mg | Freq: Four times a day (QID) | INTRAMUSCULAR | Status: DC | PRN

## 2017-02-16 MED ORDER — MECLIZINE HCL 12.5 MG PO TABS: 25.0000 mg | ORAL_TABLET | Freq: Three times a day (TID) | ORAL | Status: DC | PRN

## 2017-02-16 MED ORDER — INFLUENZA VAC SPLIT HIGH-DOSE 0.5 ML IM SUSY
0.5000 mL | PREFILLED_SYRINGE | INTRAMUSCULAR | Status: AC
  Administered 2017-02-17: 0.5 mL via INTRAMUSCULAR
  Filled 2017-02-16: qty 0.5

## 2017-02-16 MED ORDER — ASPIRIN 81 MG PO CHEW
81.0000 mg | CHEWABLE_TABLET | Freq: Every day | ORAL | Status: DC
  Administered 2017-02-16 – 2017-02-19 (×4): 81 mg via ORAL
  Filled 2017-02-16 (×4): qty 1

## 2017-02-16 MED ORDER — TRAMADOL HCL 50 MG PO TABS: 50.0000 mg | ORAL_TABLET | Freq: Four times a day (QID) | ORAL | Status: DC | PRN

## 2017-02-16 MED ORDER — ALBUTEROL SULFATE (2.5 MG/3ML) 0.083% IN NEBU: 2.5000 mg | INHALATION_SOLUTION | Freq: Four times a day (QID) | RESPIRATORY_TRACT | Status: DC | PRN

## 2017-02-16 MED ORDER — ENOXAPARIN SODIUM 40 MG/0.4ML ~~LOC~~ SOLN
40.0000 mg | SUBCUTANEOUS | Status: DC
  Administered 2017-02-16 – 2017-02-18 (×3): 40 mg via SUBCUTANEOUS
  Filled 2017-02-16 (×2): qty 0.4

## 2017-02-16 MED ORDER — SENNA 8.6 MG PO TABS
1.0000 | ORAL_TABLET | Freq: Every day | ORAL | Status: DC
  Administered 2017-02-16 – 2017-02-17 (×2): 8.6 mg via ORAL
  Filled 2017-02-16 (×2): qty 1

## 2017-02-16 MED ORDER — METOPROLOL SUCCINATE ER 25 MG PO TB24
25.0000 mg | ORAL_TABLET | Freq: Every day | ORAL | Status: DC
  Administered 2017-02-16 – 2017-02-19 (×4): 25 mg via ORAL
  Filled 2017-02-16 (×4): qty 1

## 2017-02-16 MED ORDER — POTASSIUM CHLORIDE IN NACL 20-0.9 MEQ/L-% IV SOLN
INTRAVENOUS | Status: DC
  Administered 2017-02-16 – 2017-02-18 (×4): via INTRAVENOUS

## 2017-02-16 NOTE — Progress Notes (Deleted)
History and Physical    [name]:[id number] DOB: Oct 27, 1924 DOA: 02/16/2017  PCP: Timmothy Euler, MD  Patient coming from: home  I have personally briefly reviewed patient's old medical records in [place]  Chief Complaint: weakness  HPI: [name] is a 83 y.o. male with medical history significant of HTN, DM, CKD3, HLD,diastolic CHF, was recently discharged from the hospital after being treated for vertigo and orthostasis. Family reports that initially he was doing well and had no complaints. At approximately 4:30pm on 9/20 he began complaining of significant dysuria. Family noted that he was increasingly weak and was having difficulty walking. He began to feel feverish. He did not have any vomiting, diarrhea. He has a chronic cough that is unchanged. His weakness progressed to the point that he could not stand and family had to carry him. He was brought to the ER for evaluation.  ED Course: In the emergency room, he was noted to be afebrile. Hemodynamics were stable. He had elevation of WBC count of 15.2. Creatinine mildly decreased at 1.3. CT scan of the abdomen and pelvis indicated cystitis without any other acute findings. Chest x-ray did not show any signs of pneumonia. Urinalysis indicated a possible infection. He is referred for admission.  Review of Systems: As per HPI otherwise 10 point review of systems negative.    Past Medical History:  Diagnosis Date  . Aortic insufficiency   . Aortic valve sclerosis   . Bronchitis   . CHF (congestive heart failure) ([place])   . Dementia   . Diabetes mellitus    Diet control   . Diverticulosis   . Dyslipidemia   . GERD (gastroesophageal reflux disease)   . Hard of hearing   . Hemorrhoids   . Hernia cerebri ([place])   . History of pneumonia   . Hypertension   . Hypothyroidism   . MI (myocardial infarction) ([place]) 1996   percutaneous coronary intervention   . Small bowel obstruction [facility])     Past Surgical  History:  Procedure Laterality Date  . ABDOMINAL HERNIA REPAIR     multiple sx's  . CATARACT EXTRACTION     left eye  . CHOLECYSTECTOMY    . COLONOSCOPY  2008  . EXPLORATORY LAPAROTOMY W/ BOWEL RESECTION     ventral hernia repair  . TRANSURETHRAL RESECTION OF PROSTATE       reports that he quit smoking about 74 years ago. He has never used smokeless tobacco. He reports that he does not drink alcohol or use drugs.  No Known Allergies  Family History  Problem Relation Age of Onset  . Colon cancer Neg Hx      Prior to Admission medications   Medication Sig Start Date End Date Taking? Authorizing Provider  albuterol (PROVENTIL) (2.5 MG/3ML) 0.083% nebulizer solution Take 3 mLs (2.5 mg total) by nebulization every 6 (six) hours as needed for wheezing or shortness of breath. 07/02/14  Yes Chipper Herb, MD  aspirin 81 MG tablet Take 81 mg by mouth daily.   Yes [provider]  cholecalciferol (VITAMIN D) 1000 units tablet Take 1,000 Units by mouth daily.   Yes [provider]  Diaper Rash Products (DESITIN EX) Apply 1 application topically daily.   Yes [provider]  docusate sodium (COLACE) 100 MG capsule Take 1 capsule (100 mg total) by mouth at bedtime. 11/01/15  Yes Stacks, Cletus Gash, MD  fluticasone (FLONASE) 50 MCG/ACT nasal spray Place 1 spray into both nostrils  2 (two) times daily as needed for allergies or rhinitis. 04/28/16  Yes Dettinger, Fransisca Kaufmann, MD  furosemide (LASIX) 40 MG tablet TAKE (2) TABLETS DAILY 02/12/17  Yes Claretta Fraise, MD  gabapentin (NEURONTIN) 300 MG capsule Take 1 capsule (300 mg total) by mouth at bedtime. 01/30/17  Yes Stacks, Cletus Gash, MD  HYDROcodone-homatropine [facility]) 5-1.5 MG/5ML syrup Take 5 mLs by mouth every 6 (six) hours as needed for cough. 07/24/16  Yes Claretta Fraise, MD  hydrOXYzine (VISTARIL) 25 MG capsule Take 1 capsule (25 mg total) by mouth every 6 (six) hours as needed for anxiety. 06/25/15  Yes Stacks, Cletus Gash, MD    JANUVIA 50 MG tablet TAKE 1 TABLET DAILY 01/15/17  Yes Claretta Fraise, MD  levothyroxine (SYNTHROID, LEVOTHROID) 88 MCG tablet Take 1 tablet (88 mcg total) by mouth daily. 02/12/17  Yes Claretta Fraise, MD  meclizine (ANTIVERT) 25 MG tablet Take 1 tablet (25 mg total) by mouth 3 (three) times daily as needed for dizziness. 07/19/16  Yes Terald Sleeper, PA-C  metoprolol succinate (TOPROL-XL) 25 MG 24 hr tablet TAKE 1 TABLET ONCE DAILY WITH A MEAL 02/12/17  Yes Stacks, Cletus Gash, MD  Natural Senna Laxative 64.8-324 MG TABS Take 1 tablet by mouth at bedtime.    Yes [provider]  Omega-3 Fatty Acids (FISH OIL) 1000 MG CAPS Take 1 capsule by mouth daily.   Yes [provider]  omeprazole (PRILOSEC) 20 MG capsule TAKE (1) CAPSULE DAILY 01/30/17  Yes Claretta Fraise, MD  ondansetron (ZOFRAN) 8 MG tablet Take 1 tablet (8 mg total) by mouth every 8 (eight) hours as needed for nausea or vomiting. 10/17/15  Yes Daleen Bo, MD  potassium chloride (K-DUR,KLOR-CON) 10 MEQ tablet Take 1 tablet 2 (two) times daily. As a potassium supplement 07/10/16  Yes Stacks, Cletus Gash, MD  simvastatin (ZOCOR) 20 MG tablet TAKE 1 TABLET DAILY 01/15/17  Yes Claretta Fraise, MD  traMADol (ULTRAM) 50 MG tablet Take 1 tablet (50 mg total) by mouth 4 (four) times daily as needed for moderate pain. 12/28/15  Yes Claretta Fraise, MD    Physical Exam: Vitals:   02/16/17 0910 02/16/17 0911 02/16/17 1300 02/16/17 1523  BP:  135/75 138/78 (!) 144/70  Pulse:  (!) 102 72 85  Resp:  14 20 18   Temp:  98.8 F (37.1 C)  98.5 F (36.9 C)  TempSrc:  Oral  Oral  SpO2:  94% 95% 95%  Weight: 93 kg (205 lb)   93.4 kg (205 lb 14.6 oz)    Constitutional: NAD, calm, comfortable Vitals:   02/16/17 0910 02/16/17 0911 02/16/17 1300 02/16/17 1523  BP:  135/75 138/78 (!) 144/70  Pulse:  (!) 102 72 85  Resp:  14 20 18   Temp:  98.8 F (37.1 C)  98.5 F (36.9 C)  TempSrc:  Oral  Oral  SpO2:  94% 95% 95%  Weight: 93 kg (205 lb)   93.4  kg (205 lb 14.6 oz)   Eyes: PERRL, lids and conjunctivae normal ENMT: Mucous membranes are moist. Posterior pharynx clear of any exudate or lesions.Normal dentition. Hard of hearing Neck: normal, supple, no masses, no thyromegaly Respiratory: clear to auscultation bilaterally, no wheezing, no crackles. Normal respiratory effort. No accessory muscle use.  Cardiovascular: Regular rate and rhythm, no murmurs / rubs / gallops. No extremity edema. 2+ pedal pulses. No carotid bruits.  Abdomen: no tenderness, no masses palpated. No hepatosplenomegaly. Bowel sounds positive.  Musculoskeletal: no clubbing / cyanosis. No joint deformity upper and lower  extremities. Good ROM, no contractures. Normal muscle tone.  Skin: no rashes, lesions, ulcers. No induration Neurologic: CN 2-12 grossly intact. Sensation intact, DTR normal. Strength 5/5 in all 4.  Psychiatric: answers simple questions appropriately  Labs on Admission: I have personally reviewed following labs and imaging studies  CBC:  Recent Labs Lab 02/13/17 0950 02/14/17 0640 02/16/17 0944  WBC 6.8 6.7 15.2*  NEUTROABS 4.7  --  12.2*  HGB 13.6 12.4* 13.2  HCT 40.0 37.5* 38.8*  MCV 87.5 87.8 86.8  PLT 175 172 683   Basic Metabolic Panel:  Recent Labs Lab 02/13/17 0950 02/14/17 0640 02/16/17 0944  NA 136 138 132*  K 3.6 3.4* 3.4*  CL 99* 106 99*  CO2 27 26 26   GLUCOSE 245* 176* 237*  BUN 17 15 14   CREATININE 1.36* 1.16 1.32*  CALCIUM 8.7* 8.3* 8.7*   GFR: CrCl cannot be calculated (Unknown ideal weight.). Liver Function Tests:  Recent Labs Lab 02/16/17 0944  AST 19  ALT 25  ALKPHOS 66  BILITOT 1.1  PROT 6.6  ALBUMIN 3.3*    Recent Labs Lab 02/16/17 0944  LIPASE 20   No results for input(s): AMMONIA in the last 168 hours. Coagulation Profile: No results for input(s): INR, PROTIME in the last 168 hours. Cardiac Enzymes:  Recent Labs Lab 02/13/17 0950 02/16/17 0944  TROPONINI <0.03 <0.03   BNP (last 3  results) No results for input(s): PROBNP in the last 8760 hours. HbA1C: No results for input(s): HGBA1C in the last 72 hours. CBG:  Recent Labs Lab 02/13/17 2055 02/14/17 0721 02/14/17 1120  GLUCAP 208* 167* 225*   Lipid Profile: No results for input(s): CHOL, HDL, LDLCALC, TRIG, CHOLHDL, LDLDIRECT in the last 72 hours. Thyroid Function Tests: No results for input(s): TSH, T4TOTAL, FREET4, T3FREE, THYROIDAB in the last 72 hours. Anemia Panel: No results for input(s): VITAMINB12, FOLATE, FERRITIN, TIBC, IRON, RETICCTPCT in the last 72 hours. Urine analysis:    Component Value Date/Time   COLORURINE AMBER (A) 02/16/2017 0944   APPEARANCEUR HAZY (A) 02/16/2017 0944   APPEARANCEUR Clear 08/31/2015 1526   LABSPEC 1.027 02/16/2017 0944   PHURINE 8.0 02/16/2017 0944   GLUCOSEU >=500 (A) 02/16/2017 0944   HGBUR MODERATE (A) 02/16/2017 0944   BILIRUBINUR NEGATIVE 02/16/2017 0944   BILIRUBINUR Negative 08/31/2015 1526   KETONESUR NEGATIVE 02/16/2017 0944   PROTEINUR 100 (A) 02/16/2017 0944   UROBILINOGEN 0.2 08/11/2010 1214   NITRITE POSITIVE (A) 02/16/2017 0944   LEUKOCYTESUR LARGE (A) 02/16/2017 0944   LEUKOCYTESUR Trace (A) 08/31/2015 1526    Radiological Exams on Admission: Dg Chest 1 View  Result Date: 02/16/2017 CLINICAL DATA:  Shortness of breath. EXAM: CHEST 1 VIEW COMPARISON:  07/24/2016. FINDINGS: Mediastinum and hilar structures normal. Stable cardiomegaly. Normal pulmonary vascularity. No focal infiltrate. No pleural effusion or pneumothorax. Thoracolumbar spine scoliosis. Degenerative changes both shoulders . IMPRESSION: No acute cardiopulmonary disease.  Stable cardiomegaly. Electronically Signed   By: Marcello Moores  Register   On: 02/16/2017 11:22   Ct Abdomen Pelvis W Contrast  Result Date: 02/16/2017 CLINICAL DATA:  Weakness, abdominal swelling, nausea, history of diverticulitis EXAM: CT ABDOMEN AND PELVIS WITH CONTRAST TECHNIQUE: Multidetector CT imaging of the abdomen  and pelvis was performed using the standard protocol following bolus administration of intravenous contrast. CONTRAST:  26mL ISOVUE-300 IOPAMIDOL (ISOVUE-300) INJECTION 61% COMPARISON:  10/17/2015 FINDINGS: Lower chest: Lung bases are clear. Hepatobiliary: Liver is within normal limits. Status post cholecystectomy. No intrahepatic or extrahepatic ductal dilatation. Pancreas: Within normal  limits. Spleen: Within normal limits. Adrenals/Urinary Tract: Adrenal glands are within normal limits. Kidneys are within normal limits.  No hydronephrosis. Bladder is underdistended but thick-walled with perivesical inflammatory changes. Stomach/Bowel: Stomach is within normal limits. No evidence of bowel obstruction. Normal appendix (series 3/image 31). Mild left colonic diverticulosis, without convincing inflammatory changes. Vascular/Lymphatic: No evidence of abdominal aortic aneurysm. Atherosclerotic calcifications of the abdominal aorta and branch vessels. No suspicious abdominopelvic lymphadenopathy. Reproductive: Prostatomegaly with dystrophic calcifications. Other: No abdominopelvic ascites. Diastases of the anterior abdominal wall with abdominal wall laxity/large ventral hernia. Small fat containing left inguinal hernia. Musculoskeletal: Degenerative changes of the visualized thoracolumbar spine. Prominent superior endplate Schmorl's node at L5. Mild superior endplate changes at L4. IMPRESSION: Wall thickening/inflammatory changes involving the bladder, favoring cystitis. Colonic diverticulosis, without evidence of diverticulitis. No evidence of bowel obstruction. Normal appendix. Additional stable ancillary findings as above. Electronically Signed   By: Julian Hy M.D.   On: 02/16/2017 11:33    Assessment/Plan Active Problems:   Diabetes mellitus type II, non insulin dependent (HCC)   Hyperlipidemia   Essential hypertension   Hypothyroid   Dehydration   CKD (chronic kidney disease), stage III   Chronic  diastolic CHF (congestive heart failure) (HCC)   Hypokalemia   UTI (urinary tract infection)   Generalized weakness   1. Urinary tract infection. Start the patient on Rocephin. Urine culture has been sent already followed up. 2. Generalized weakness. Suspect is related to dehydration and UTI. Will get physical therapy evaluation. 3. Hypertension. Stable. Continue outpatient regimen. 4. Chronic diastolic CHF. Will hold further Lasix since creatinine is elevated. Monitor for signs of volume overload.  5. Chronic kidney disease stage III. He's had a mild elevation of creatinine at 1.3. Anticipate this should improve with IV hydration. 6. Hypothyroidism. Continue Synthroid 7. Diabetes. Continue on treatment. His sliding scale insulin. 8. Hyperlipidemia. Continue statin 9. Hypokalemia. Replace  DVT prophylaxis: lovenox Code Status: full code Family Communication: discussed with daughter (POA) and wife at the bedside Disposition Plan: pending physical therapy evaluation Consults called:  Admission status: observation, Dollene Cleveland MD [facility] Pager [phone]  If 7PM-7AM, please contact night-coverage [email or web address] Password [email or web address]  02/16/2017, 6:01 PM

## 2017-02-16 NOTE — ED Notes (Signed)
Pt bed and linens changed after urinating.

## 2017-02-16 NOTE — ED Notes (Signed)
Pt had urinated on himself on EMS ride.  Urine is dark yellow and foul smelling.

## 2017-02-16 NOTE — ED Provider Notes (Signed)
[facility] DEPT Provider Note   CSN: [id number] Arrival date & time: 02/16/17  0906     History   Chief Complaint Chief Complaint  Patient presents with  . Weakness    HPI EMILLIANO DILWORTH is a 83 y.o. male.  HPI Patient recently seen in emergency department and admitted for persistent vertigo. Discharged home. EMS called due to ongoing weakness. Unable to get up out of bed. Foul-smelling urine and incontinence. Patient is unable to contribute to history given dementia and extremely hard of hearing. Level V caveat. Past Medical History:  Diagnosis Date  . Aortic insufficiency   . Aortic valve sclerosis   . Bronchitis   . CHF (congestive heart failure) ([place])   . Dementia   . Diabetes mellitus    Diet control   . Diverticulosis   . Dyslipidemia   . GERD (gastroesophageal reflux disease)   . Hard of hearing   . Hemorrhoids   . Hernia cerebri ([place])   . History of pneumonia   . Hypertension   . Hypothyroidism   . MI (myocardial infarction) ([place]) 1996   percutaneous coronary intervention   . Small bowel obstruction [facility])     Patient Active Problem List   Diagnosis Date Noted  . UTI (urinary tract infection) 02/16/2017  . Hypokalemia 02/14/2017  . Vertigo 02/13/2017  . CKD (chronic kidney disease), stage III 02/13/2017  . Chronic diastolic CHF (congestive heart failure) ([place]) 02/13/2017  . Orthostasis 02/13/2017  . Eczema 08/31/2015  . Ascites 08/31/2015  . Right hip pain 08/10/2015  . Diabetic polyneuropathy associated with type 2 diabetes mellitus ([place]) 08/10/2015  . Alzheimer's dementia 08/10/2015  . Hypothyroid 12/04/2012  . Chronic back pain 12/04/2012  . Hard of hearing 09/03/2012  . Aortic insufficiency 09/03/2012  . Vitamin D deficiency 09/03/2012  . Arthritis 09/03/2012  . Insomnia 09/03/2012  . Iron deficiency anemia, unspecified 07/31/2011  . Diabetes mellitus type II, non insulin dependent ([place]) 11/30/2009  . Hyperlipidemia 11/30/2009  .  Essential hypertension 11/30/2009  . CORONARY ATHEROSCLEROSIS NATIVE CORONARY ARTERY 11/30/2009    Past Surgical History:  Procedure Laterality Date  . ABDOMINAL HERNIA REPAIR     multiple sx's  . CATARACT EXTRACTION     left eye  . CHOLECYSTECTOMY    . COLONOSCOPY  2008  . EXPLORATORY LAPAROTOMY W/ BOWEL RESECTION     ventral hernia repair  . TRANSURETHRAL RESECTION OF PROSTATE         Home Medications    Prior to Admission medications   Medication Sig Start Date End Date Taking? Authorizing Provider  albuterol (PROVENTIL) (2.5 MG/3ML) 0.083% nebulizer solution Take 3 mLs (2.5 mg total) by nebulization every 6 (six) hours as needed for wheezing or shortness of breath. 07/02/14  Yes Chipper Herb, MD  aspirin 81 MG tablet Take 81 mg by mouth daily.   Yes [provider]  cholecalciferol (VITAMIN D) 1000 units tablet Take 1,000 Units by mouth daily.   Yes [provider]  Diaper Rash Products (DESITIN EX) Apply 1 application topically daily.   Yes [provider]  docusate sodium (COLACE) 100 MG capsule Take 1 capsule (100 mg total) by mouth at bedtime. 11/01/15  Yes Claretta Fraise, MD  fluticasone (FLONASE) 50 MCG/ACT nasal spray Place 1 spray into both nostrils 2 (two) times daily as needed for allergies or rhinitis. 04/28/16  Yes Dettinger, Fransisca Kaufmann, MD  furosemide (LASIX) 40 MG tablet TAKE (2) TABLETS DAILY 02/12/17  Yes Claretta Fraise, MD  gabapentin (NEURONTIN) 300 MG capsule Take 1 capsule (300 mg total) by mouth at bedtime. 01/30/17  Yes Stacks, Cletus Gash, MD  HYDROcodone-homatropine [facility]) 5-1.5 MG/5ML syrup Take 5 mLs by mouth every 6 (six) hours as needed for cough. 07/24/16  Yes Claretta Fraise, MD  hydrOXYzine (VISTARIL) 25 MG capsule Take 1 capsule (25 mg total) by mouth every 6 (six) hours as needed for anxiety. 06/25/15  Yes Stacks, Cletus Gash, MD  JANUVIA 50 MG tablet TAKE 1 TABLET DAILY 01/15/17  Yes Claretta Fraise, MD  levothyroxine (SYNTHROID,  LEVOTHROID) 88 MCG tablet Take 1 tablet (88 mcg total) by mouth daily. 02/12/17  Yes Claretta Fraise, MD  meclizine (ANTIVERT) 25 MG tablet Take 1 tablet (25 mg total) by mouth 3 (three) times daily as needed for dizziness. 07/19/16  Yes Terald Sleeper, PA-C  metoprolol succinate (TOPROL-XL) 25 MG 24 hr tablet TAKE 1 TABLET ONCE DAILY WITH A MEAL 02/12/17  Yes Stacks, Cletus Gash, MD  Natural Senna Laxative 64.8-324 MG TABS Take 1 tablet by mouth at bedtime.    Yes [provider]  Omega-3 Fatty Acids (FISH OIL) 1000 MG CAPS Take 1 capsule by mouth daily.   Yes [provider]  omeprazole (PRILOSEC) 20 MG capsule TAKE (1) CAPSULE DAILY 01/30/17  Yes Claretta Fraise, MD  ondansetron (ZOFRAN) 8 MG tablet Take 1 tablet (8 mg total) by mouth every 8 (eight) hours as needed for nausea or vomiting. 10/17/15  Yes Daleen Bo, MD  potassium chloride (K-DUR,KLOR-CON) 10 MEQ tablet Take 1 tablet 2 (two) times daily. As a potassium supplement 07/10/16  Yes Stacks, Cletus Gash, MD  simvastatin (ZOCOR) 20 MG tablet TAKE 1 TABLET DAILY 01/15/17  Yes Claretta Fraise, MD  traMADol (ULTRAM) 50 MG tablet Take 1 tablet (50 mg total) by mouth 4 (four) times daily as needed for moderate pain. 12/28/15  Yes Claretta Fraise, MD    Family History Family History  Problem Relation Age of Onset  . Colon cancer Neg Hx     Social History Social History  Substance Use Topics  . Smoking status: Former Smoker    Quit date: 08/09/1942  . Smokeless tobacco: Never Used  . Alcohol use No     Allergies   Patient has no known allergies.   Review of Systems Review of Systems  Unable to perform ROS: Dementia     Physical Exam Updated Vital Signs BP 138/78   Pulse 72   Temp 98.8 F (37.1 C) (Oral)   Resp 20   Wt 93 kg (205 lb)   SpO2 95%   BMI 34.11 kg/m   Physical Exam  Constitutional: He appears well-developed and well-nourished.  Appears more lethargic than he did 2 days ago.  HENT:  Head: Normocephalic  and atraumatic.  Dry mucous membranes  Eyes: Pupils are equal, round, and reactive to light. EOM are normal.  Neck: Normal range of motion. Neck supple.  Cardiovascular: Normal rate and regular rhythm.  Exam reveals no gallop and no friction rub.   No murmur heard. Pulmonary/Chest: Effort normal and breath sounds normal. No respiratory distress. He has no wheezes. He has no rales. He exhibits no tenderness.  Abdominal: Soft. Bowel sounds are normal. There is tenderness. There is no rebound and no guarding.  Ventral abdominal hernia. Patient does have some right lower quadrant tenderness to palpation. No rebound or guarding. Bowel sounds present.  Musculoskeletal: Normal range of motion. He exhibits no edema or tenderness.  No lower extremity swelling, asymmetry or tenderness.  Neurological:  Follow simple commands. Moving all extremities without focal deficit. Sensation intact.  Skin: Skin is warm and dry. Capillary refill takes less than 2 seconds. No rash noted. No erythema.  Psychiatric: He has a normal mood and affect. His behavior is normal.  Nursing note and vitals reviewed.    ED Treatments / Results  Labs (all labs ordered are listed, but only abnormal results are displayed) Labs Reviewed  CBC WITH DIFFERENTIAL/PLATELET - Abnormal; Notable for the following:       Result Value   WBC 15.2 (*)    HCT 38.8 (*)    Neutro Abs 12.2 (*)    Monocytes Absolute 1.3 (*)    All other components within normal limits  COMPREHENSIVE METABOLIC PANEL - Abnormal; Notable for the following:    Sodium 132 (*)    Potassium 3.4 (*)    Chloride 99 (*)    Glucose, Bld 237 (*)    Creatinine, Ser 1.32 (*)    Calcium 8.7 (*)    Albumin 3.3 (*)    GFR calc non Af Amer 46 (*)    GFR calc Af Amer 53 (*)    All other components within normal limits  URINALYSIS, ROUTINE W REFLEX MICROSCOPIC - Abnormal; Notable for the following:    Color, Urine AMBER (*)    APPearance HAZY (*)    Glucose, UA  >=500 (*)    Hgb urine dipstick MODERATE (*)    Protein, ur 100 (*)    Nitrite POSITIVE (*)    Leukocytes, UA LARGE (*)    Bacteria, UA RARE (*)    Squamous Epithelial / LPF 0-5 (*)    All other components within normal limits  URINE CULTURE  LIPASE, BLOOD  TROPONIN I  BRAIN NATRIURETIC PEPTIDE  I-STAT CG4 LACTIC ACID, ED    EKG  EKG Interpretation None       Radiology Dg Chest 1 View  Result Date: 02/16/2017 CLINICAL DATA:  Shortness of breath. EXAM: CHEST 1 VIEW COMPARISON:  07/24/2016. FINDINGS: Mediastinum and hilar structures normal. Stable cardiomegaly. Normal pulmonary vascularity. No focal infiltrate. No pleural effusion or pneumothorax. Thoracolumbar spine scoliosis. Degenerative changes both shoulders . IMPRESSION: No acute cardiopulmonary disease.  Stable cardiomegaly. Electronically Signed   By: Marcello Moores  Register   On: 02/16/2017 11:22   Ct Abdomen Pelvis W Contrast  Result Date: 02/16/2017 CLINICAL DATA:  Weakness, abdominal swelling, nausea, history of diverticulitis EXAM: CT ABDOMEN AND PELVIS WITH CONTRAST TECHNIQUE: Multidetector CT imaging of the abdomen and pelvis was performed using the standard protocol following bolus administration of intravenous contrast. CONTRAST:  37mL ISOVUE-300 IOPAMIDOL (ISOVUE-300) INJECTION 61% COMPARISON:  10/17/2015 FINDINGS: Lower chest: Lung bases are clear. Hepatobiliary: Liver is within normal limits. Status post cholecystectomy. No intrahepatic or extrahepatic ductal dilatation. Pancreas: Within normal limits. Spleen: Within normal limits. Adrenals/Urinary Tract: Adrenal glands are within normal limits. Kidneys are within normal limits.  No hydronephrosis. Bladder is underdistended but thick-walled with perivesical inflammatory changes. Stomach/Bowel: Stomach is within normal limits. No evidence of bowel obstruction. Normal appendix (series 3/image 31). Mild left colonic diverticulosis, without convincing inflammatory changes.  Vascular/Lymphatic: No evidence of abdominal aortic aneurysm. Atherosclerotic calcifications of the abdominal aorta and branch vessels. No suspicious abdominopelvic lymphadenopathy. Reproductive: Prostatomegaly with dystrophic calcifications. Other: No abdominopelvic ascites. Diastases of the anterior abdominal wall with abdominal wall laxity/large ventral hernia. Small fat containing left inguinal hernia. Musculoskeletal: Degenerative changes of the visualized thoracolumbar spine. Prominent superior endplate Schmorl's node at L5. Mild superior  endplate changes at L4. IMPRESSION: Wall thickening/inflammatory changes involving the bladder, favoring cystitis. Colonic diverticulosis, without evidence of diverticulitis. No evidence of bowel obstruction. Normal appendix. Additional stable ancillary findings as above. Electronically Signed   By: Julian Hy M.D.   On: 02/16/2017 11:33    Procedures Procedures (including critical care time)  Medications Ordered in ED Medications  sodium chloride 0.9 % bolus 500 mL (0 mLs Intravenous Stopped 02/16/17 1011)  iopamidol (ISOVUE-300) 61 % injection 80 mL (80 mLs Intravenous Contrast Given 02/16/17 1056)  cefTRIAXone (ROCEPHIN) 1 g in dextrose 5 % 50 mL IVPB (0 g Intravenous Stopped 02/16/17 1314)     Initial Impression / Assessment and Plan / ED Course  I have reviewed the triage vital signs and the nursing notes.  Pertinent labs & imaging results that were available during my care of the patient were reviewed by me and considered in my medical decision making (see chart for details).     Patient with evidence of UTI. Given weakness status change will admit to hospitalist.   Final Clinical Impressions(s) / ED Diagnoses   Final diagnoses:  Acute cystitis with hematuria  Altered mental status, unspecified altered mental status type    New Prescriptions New Prescriptions   No medications on file     Julianne Rice, MD 02/16/17 1326

## 2017-02-16 NOTE — ED Notes (Signed)
Report given to [facility], pt going to room 308.

## 2017-02-16 NOTE — ED Triage Notes (Signed)
Just discharged from hospital Wednesday.    EMS called for weakness.  Family states he has not been the same since he came home from the hospital.

## 2017-02-17 DIAGNOSIS — E860 Dehydration: Secondary | ICD-10-CM | POA: Diagnosis not present

## 2017-02-17 DIAGNOSIS — E785 Hyperlipidemia, unspecified: Secondary | ICD-10-CM | POA: Diagnosis not present

## 2017-02-17 DIAGNOSIS — E119 Type 2 diabetes mellitus without complications: Secondary | ICD-10-CM | POA: Diagnosis not present

## 2017-02-17 DIAGNOSIS — I10 Essential (primary) hypertension: Secondary | ICD-10-CM | POA: Diagnosis not present

## 2017-02-17 DIAGNOSIS — I5032 Chronic diastolic (congestive) heart failure: Secondary | ICD-10-CM | POA: Diagnosis not present

## 2017-02-17 DIAGNOSIS — N3001 Acute cystitis with hematuria: Secondary | ICD-10-CM | POA: Diagnosis not present

## 2017-02-17 DIAGNOSIS — N183 Chronic kidney disease, stage 3 (moderate): Secondary | ICD-10-CM | POA: Diagnosis not present

## 2017-02-17 DIAGNOSIS — E876 Hypokalemia: Secondary | ICD-10-CM | POA: Diagnosis not present

## 2017-02-17 DIAGNOSIS — R531 Weakness: Secondary | ICD-10-CM | POA: Diagnosis not present

## 2017-02-17 LAB — GLUCOSE, CAPILLARY
GLUCOSE-CAPILLARY: 176 mg/dL — AB (ref 65–99)
GLUCOSE-CAPILLARY: 181 mg/dL — AB (ref 65–99)
Glucose-Capillary: 151 mg/dL — ABNORMAL HIGH (ref 65–99)
Glucose-Capillary: 165 mg/dL — ABNORMAL HIGH (ref 65–99)

## 2017-02-17 LAB — BASIC METABOLIC PANEL
Anion gap: 4 — ABNORMAL LOW (ref 5–15)
BUN: 12 mg/dL (ref 6–20)
CALCIUM: 7.9 mg/dL — AB (ref 8.9–10.3)
CO2: 25 mmol/L (ref 22–32)
Chloride: 105 mmol/L (ref 101–111)
Creatinine, Ser: 1.29 mg/dL — ABNORMAL HIGH (ref 0.61–1.24)
GFR calc Af Amer: 55 mL/min — ABNORMAL LOW (ref >60)
GFR, EST NON AFRICAN AMERICAN: 47 mL/min — AB (ref >60)
GLUCOSE: 188 mg/dL — AB (ref 65–99)
Potassium: 3.7 mmol/L (ref 3.5–5.1)
Sodium: 134 mmol/L — ABNORMAL LOW (ref 135–145)

## 2017-02-17 LAB — CBC
HCT: 34.6 % — ABNORMAL LOW (ref 39.0–52.0)
Hemoglobin: 11.7 g/dL — ABNORMAL LOW (ref 13.0–17.0)
MCH: 29.6 pg (ref 26.0–34.0)
MCHC: 33.8 g/dL (ref 30.0–36.0)
MCV: 87.6 fL (ref 78.0–100.0)
PLATELETS: 145 10*3/uL — AB (ref 150–400)
RBC: 3.95 MIL/uL — ABNORMAL LOW (ref 4.22–5.81)
RDW: 13.6 % (ref 11.5–15.5)
WBC: 14.1 10*3/uL — ABNORMAL HIGH (ref 4.0–10.5)

## 2017-02-17 NOTE — Evaluation (Signed)
Physical Therapy Evaluation Patient Details Name: [name] MRN: [id number] DOB: September 20, 1924 Today's Date: 02/17/2017   History of Present Illness  Patient recently seen in emergency department and admitted for persistent vertigo. Discharged home. EMS called due to ongoing weakness. Unable to get up out of bed. Foul-smelling urine and incontinence. Patient is unable to contribute to history given dementia and extremely hard of hearing. Level V caveat.  Clinical Impression  Evaluation is limited by pt understanding therapist.  Recommend HH and 24 hour supervision.    Follow Up Recommendations Home health PT    Equipment Recommendations  None recommended by PT    Recommendations for Other Services   None    Precautions / Restrictions Precautions Precautions: Fall Restrictions Weight Bearing Restrictions: No      Mobility  Bed Mobility Overal bed mobility: Needs Assistance       Supine to sit: Min assist Sit to supine: Modified independent (Device/Increase time)      Transfers Overall transfer level: Needs assistance   Transfers: Sit to/from Stand;Stand Pivot Transfers Sit to Stand: Min assist Stand pivot transfers: Supervision          Ambulation/Gait         Gait velocity: could only get pt to side step at base.  Gait velocity interpretation: <1.8 ft/sec, indicative of risk for recurrent falls    Stairs            Wheelchair Mobility    Modified Rankin (Stroke Patients Only)       Balance Overall balance assessment: Needs assistance Sitting-balance support: No upper extremity supported;Feet supported Sitting balance-Leahy Scale: Good     Standing balance support: Bilateral upper extremity supported;During functional activity Standing balance-Leahy Scale: Fair                               Pertinent Vitals/Pain Pain Assessment: Faces Faces Pain Scale: No hurt    Home Living Family/patient expects to be discharged  to:: Private residence Living Arrangements: Spouse/significant other Available Help at Discharge: Family Type of Home: House Home Access: Level entry     Home Layout: One level Home Equipment: Environmental consultant - 2 wheels;Cane - single point;Bedside commode;Shower seat;Wheelchair - manual      Prior Function Level of Independence: Independent with assistive device(s)               Hand Dominance        Extremity/Trunk Assessment        Lower Extremity Assessment Lower Extremity Assessment: Generalized weakness       Communication   Communication: HOH;Expressive difficulties  Cognition   Behavior During Therapy: WFL for tasks assessed/performed;Flat affect Overall Cognitive Status: History of cognitive impairments - at baseline                                        General Comments      Exercises General Exercises - Lower Extremity Ankle Circles/Pumps: Both;5 reps Heel Slides: Both;5 reps Hip ABduction/ADduction: 5 reps   Assessment/Plan    PT Assessment Patient needs continued PT services  PT Problem List Decreased balance;Decreased activity tolerance;Decreased mobility       PT Treatment Interventions Gait training;Balance training;Therapeutic exercise    PT Goals (Current goals can be found in the Care Plan section)  Acute Rehab PT Goals PT Goal Formulation: Patient  unable to participate in goal setting Potential to Achieve Goals: Fair    Frequency Min 3X/week   Barriers to discharge           AM-PAC PT "6 Clicks" Daily Activity  Outcome Measure Difficulty turning over in bed (including adjusting bedclothes, sheets and blankets)?: A Little Difficulty moving from lying on back to sitting on the side of the bed? : A Little Difficulty sitting down on and standing up from a chair with arms (e.g., wheelchair, bedside commode, etc,.)?: A Little Help needed moving to and from a bed to chair (including a wheelchair)?: A Little Help needed  walking in hospital room?: A Little Help needed climbing 3-5 steps with a railing? : A Lot 6 Click Score: 17    End of Session Equipment Utilized During Treatment: Gait belt Activity Tolerance: Other (comment) (PT cognitive limitations) Patient left: in bed   PT Visit Diagnosis: Unsteadiness on feet (R26.81);Difficulty in walking, not elsewhere classified (R26.2)    Time: 6720-9470 PT Time Calculation (min) (ACUTE ONLY): 30 min   Charges:   PT Evaluation $PT Eval Low Complexity: 1 Low     PT G Codes:   PT G-Codes **NOT FOR INPATIENT CLASS** Functional Assessment Tool Used: AM-PAC 6 Clicks Basic Mobility Functional Limitation: Mobility: Walking and moving around Mobility: Walking and Moving Around Current Status (J6283): At least 40 percent but less than 60 percent impaired, limited or restricted Mobility: Walking and Moving Around Goal Status [phone]): At least 40 percent but less than 60 percent impaired, limited or restricted Mobility: Walking and Moving Around Discharge Status [phone]): At least 40 percent but less than 60 percent impaired, limited or restricted      Rayetta Humphrey, PT CLT [phone] 02/17/2017, 12:11 PM

## 2017-02-17 NOTE — Progress Notes (Signed)
PROGRESS NOTE    [name]  YDX:[id number] DOB: 1925/04/24 DOA: 02/16/2017 PCP: Timmothy Euler, MD    Brief Narrative:  83 year old male was brought to the hospital with complaints of weakness. He was found to have dehydration and urinary tract infection. Patient was unable to stand and had to be carried by his family. He is on IV fluids and antibiotics. Urine culture has been sent.   Assessment & Plan:   Active Problems:   Diabetes mellitus type II, non insulin dependent (HCC)   Hyperlipidemia   Essential hypertension   Hypothyroid   Dehydration   CKD (chronic kidney disease), stage III   Chronic diastolic CHF (congestive heart failure) (HCC)   Hypokalemia   UTI (urinary tract infection)   Generalized weakness   1. Urinary tract infection. Patient is on Rocephin. Urine culture has been sent and will be followed up. 2. Generalized weakness. Suspect is related to dehydration and UTI. Physical therapy has evaluated the patient and recommended home health physical therapy. 3. Hypertension. Stable. Continue outpatient regimen. 4. Chronic diastolic CHF. Will hold further Lasix since creatinine is elevated. Monitor for signs of volume overload.  5. Chronic kidney disease stage III. He's had a mild elevation of creatinine at 1.3. Anticipate this should improve with IV hydration. 6. Hypothyroidism. Continue Synthroid 7. Diabetes. Continue on treatment. Continue sliding scale insulin. Blood sugars have been stable 8. Hyperlipidemia. Continue statin 9. Hypokalemia. Replace   DVT prophylaxis: lovenox Code Status: Full code Family Communication: No family present Disposition Plan: Discharge home when improved.   Consultants:     Procedures:     Antimicrobials:   Rocephin 9/21>>   Subjective: Denies any shortness of breath. Still has dysuria.  Objective: Vitals:   02/16/17 1523 02/16/17 2136 02/17/17 0717 02/17/17 1250  BP: (!) 144/70 (!) 134/118 (!) 124/59  (!) 123/54  Pulse: 85 79 66 71  Resp: 18 18 20 18   Temp: 98.5 F (36.9 C) 98.6 F (37 C)  98.5 F (36.9 C)  TempSrc: Oral Oral  Oral  SpO2: 95% 95% 96% 97%  Weight: 93.4 kg (205 lb 14.6 oz)       Intake/Output Summary (Last 24 hours) at 02/17/17 1758 Last data filed at 02/17/17 1600  Gross per 24 hour  Intake          1653.75 ml  Output                0 ml  Net          1653.75 ml   Filed Weights   02/16/17 0910 02/16/17 1523  Weight: 93 kg (205 lb) 93.4 kg (205 lb 14.6 oz)    Examination:  General exam: Appears calm and comfortable  Respiratory system: Clear to auscultation. Respiratory effort normal. Cardiovascular system: S1 & S2 heard, RRR. No JVD, murmurs, rubs, gallops or clicks. No pedal edema. Gastrointestinal system: Abdomen is nondistended, soft and nontender. No organomegaly or masses felt. Normal bowel sounds heard. Central nervous system: Alert and oriented. No focal neurological deficits. Extremities: Symmetric 5 x 5 power. Skin: No rashes, lesions or ulcers Psychiatry: Judgement and insight appear normal. Mood & affect appropriate.     Data Reviewed: I have personally reviewed following labs and imaging studies  CBC:  Recent Labs Lab 02/13/17 0950 02/14/17 0640 02/16/17 0944 02/17/17 0519  WBC 6.8 6.7 15.2* 14.1*  NEUTROABS 4.7  --  12.2*  --   HGB 13.6 12.4* 13.2 11.7*  HCT 40.0 37.5* 38.8*  34.6*  MCV 87.5 87.8 86.8 87.6  PLT 175 172 165 086*   Basic Metabolic Panel:  Recent Labs Lab 02/13/17 0950 02/14/17 0640 02/16/17 0944 02/17/17 0519  NA 136 138 132* 134*  K 3.6 3.4* 3.4* 3.7  CL 99* 106 99* 105  CO2 27 26 26 25   GLUCOSE 245* 176* 237* 188*  BUN 17 15 14 12   CREATININE 1.36* 1.16 1.32* 1.29*  CALCIUM 8.7* 8.3* 8.7* 7.9*   GFR: CrCl cannot be calculated (Unknown ideal weight.). Liver Function Tests:  Recent Labs Lab 02/16/17 0944  AST 19  ALT 25  ALKPHOS 66  BILITOT 1.1  PROT 6.6  ALBUMIN 3.3*    Recent  Labs Lab 02/16/17 0944  LIPASE 20   No results for input(s): AMMONIA in the last 168 hours. Coagulation Profile: No results for input(s): INR, PROTIME in the last 168 hours. Cardiac Enzymes:  Recent Labs Lab 02/13/17 0950 02/16/17 0944  TROPONINI <0.03 <0.03   BNP (last 3 results) No results for input(s): PROBNP in the last 8760 hours. HbA1C: No results for input(s): HGBA1C in the last 72 hours. CBG:  Recent Labs Lab 02/14/17 1120 02/16/17 2238 02/17/17 0735 02/17/17 1114 02/17/17 1607  GLUCAP 225* 197* 176* 181* 151*   Lipid Profile: No results for input(s): CHOL, HDL, LDLCALC, TRIG, CHOLHDL, LDLDIRECT in the last 72 hours. Thyroid Function Tests: No results for input(s): TSH, T4TOTAL, FREET4, T3FREE, THYROIDAB in the last 72 hours. Anemia Panel: No results for input(s): VITAMINB12, FOLATE, FERRITIN, TIBC, IRON, RETICCTPCT in the last 72 hours. Sepsis Labs:  Recent Labs Lab 02/16/17 0955  LATICACIDVEN 1.19    No results found for this or any previous visit (from the past 240 hour(s)).       Radiology Studies: Dg Chest 1 View  Result Date: 02/16/2017 CLINICAL DATA:  Shortness of breath. EXAM: CHEST 1 VIEW COMPARISON:  07/24/2016. FINDINGS: Mediastinum and hilar structures normal. Stable cardiomegaly. Normal pulmonary vascularity. No focal infiltrate. No pleural effusion or pneumothorax. Thoracolumbar spine scoliosis. Degenerative changes both shoulders . IMPRESSION: No acute cardiopulmonary disease.  Stable cardiomegaly. Electronically Signed   By: Marcello Moores  Register   On: 02/16/2017 11:22   Ct Abdomen Pelvis W Contrast  Result Date: 02/16/2017 CLINICAL DATA:  Weakness, abdominal swelling, nausea, history of diverticulitis EXAM: CT ABDOMEN AND PELVIS WITH CONTRAST TECHNIQUE: Multidetector CT imaging of the abdomen and pelvis was performed using the standard protocol following bolus administration of intravenous contrast. CONTRAST:  47mL ISOVUE-300 IOPAMIDOL  (ISOVUE-300) INJECTION 61% COMPARISON:  10/17/2015 FINDINGS: Lower chest: Lung bases are clear. Hepatobiliary: Liver is within normal limits. Status post cholecystectomy. No intrahepatic or extrahepatic ductal dilatation. Pancreas: Within normal limits. Spleen: Within normal limits. Adrenals/Urinary Tract: Adrenal glands are within normal limits. Kidneys are within normal limits.  No hydronephrosis. Bladder is underdistended but thick-walled with perivesical inflammatory changes. Stomach/Bowel: Stomach is within normal limits. No evidence of bowel obstruction. Normal appendix (series 3/image 31). Mild left colonic diverticulosis, without convincing inflammatory changes. Vascular/Lymphatic: No evidence of abdominal aortic aneurysm. Atherosclerotic calcifications of the abdominal aorta and branch vessels. No suspicious abdominopelvic lymphadenopathy. Reproductive: Prostatomegaly with dystrophic calcifications. Other: No abdominopelvic ascites. Diastases of the anterior abdominal wall with abdominal wall laxity/large ventral hernia. Small fat containing left inguinal hernia. Musculoskeletal: Degenerative changes of the visualized thoracolumbar spine. Prominent superior endplate Schmorl's node at L5. Mild superior endplate changes at L4. IMPRESSION: Wall thickening/inflammatory changes involving the bladder, favoring cystitis. Colonic diverticulosis, without evidence of diverticulitis. No evidence of bowel  obstruction. Normal appendix. Additional stable ancillary findings as above. Electronically Signed   By: Julian Hy M.D.   On: 02/16/2017 11:33        Scheduled Meds: . aspirin  81 mg Oral Daily  . docusate sodium  100 mg Oral QHS  . enoxaparin (LOVENOX) injection  40 mg Subcutaneous Q24H  . gabapentin  300 mg Oral QHS  . insulin aspart  0-15 Units Subcutaneous TID WC  . insulin aspart  0-5 Units Subcutaneous QHS  . levothyroxine  88 mcg Oral QAC breakfast  . linagliptin  5 mg Oral Daily  .  metoprolol succinate  25 mg Oral Daily  . pantoprazole  40 mg Oral Daily  . potassium chloride  10 mEq Oral BID  . senna  1 tablet Oral QHS  . simvastatin  20 mg Oral q1800   Continuous Infusions: . 0.9 % NaCl with KCl 20 mEq / L 75 mL/hr at 02/17/17 0806  . cefTRIAXone (ROCEPHIN)  IV Stopped (02/17/17 1242)     LOS: 0 days    Time spent: 3mins    Tomeka Kantner, MD [facility] Pager [phone]  If 7PM-7AM, please contact night-coverage [email or web address] Password [facility] 02/17/2017, 5:58 PM

## 2017-02-18 DIAGNOSIS — N183 Chronic kidney disease, stage 3 (moderate): Secondary | ICD-10-CM | POA: Diagnosis not present

## 2017-02-18 DIAGNOSIS — L8990 Pressure ulcer of unspecified site, unspecified stage: Secondary | ICD-10-CM | POA: Insufficient documentation

## 2017-02-18 DIAGNOSIS — E876 Hypokalemia: Secondary | ICD-10-CM | POA: Diagnosis not present

## 2017-02-18 DIAGNOSIS — E785 Hyperlipidemia, unspecified: Secondary | ICD-10-CM | POA: Diagnosis not present

## 2017-02-18 DIAGNOSIS — E860 Dehydration: Secondary | ICD-10-CM | POA: Diagnosis not present

## 2017-02-18 DIAGNOSIS — E119 Type 2 diabetes mellitus without complications: Secondary | ICD-10-CM | POA: Diagnosis not present

## 2017-02-18 DIAGNOSIS — N3001 Acute cystitis with hematuria: Secondary | ICD-10-CM | POA: Diagnosis not present

## 2017-02-18 DIAGNOSIS — I5032 Chronic diastolic (congestive) heart failure: Secondary | ICD-10-CM | POA: Diagnosis not present

## 2017-02-18 DIAGNOSIS — R531 Weakness: Secondary | ICD-10-CM | POA: Diagnosis not present

## 2017-02-18 DIAGNOSIS — I10 Essential (primary) hypertension: Secondary | ICD-10-CM | POA: Diagnosis not present

## 2017-02-18 LAB — CBC
HEMATOCRIT: 35.7 % — AB (ref 39.0–52.0)
HEMOGLOBIN: 11.9 g/dL — AB (ref 13.0–17.0)
MCH: 29.5 pg (ref 26.0–34.0)
MCHC: 33.3 g/dL (ref 30.0–36.0)
MCV: 88.6 fL (ref 78.0–100.0)
Platelets: 152 10*3/uL (ref 150–400)
RBC: 4.03 MIL/uL — ABNORMAL LOW (ref 4.22–5.81)
RDW: 13.9 % (ref 11.5–15.5)
WBC: 10 10*3/uL (ref 4.0–10.5)

## 2017-02-18 LAB — BASIC METABOLIC PANEL
ANION GAP: 8 (ref 5–15)
BUN: 13 mg/dL (ref 6–20)
CHLORIDE: 107 mmol/L (ref 101–111)
CO2: 22 mmol/L (ref 22–32)
Calcium: 8.2 mg/dL — ABNORMAL LOW (ref 8.9–10.3)
Creatinine, Ser: 1.18 mg/dL (ref 0.61–1.24)
GFR calc Af Amer: >60 mL/min (ref >60)
GFR calc non Af Amer: 53 mL/min — ABNORMAL LOW (ref >60)
GLUCOSE: 153 mg/dL — AB (ref 65–99)
POTASSIUM: 3.9 mmol/L (ref 3.5–5.1)
Sodium: 137 mmol/L (ref 135–145)

## 2017-02-18 LAB — GLUCOSE, CAPILLARY
GLUCOSE-CAPILLARY: 155 mg/dL — AB (ref 65–99)
Glucose-Capillary: 150 mg/dL — ABNORMAL HIGH (ref 65–99)
Glucose-Capillary: 134 mg/dL — ABNORMAL HIGH (ref 65–99)
Glucose-Capillary: 151 mg/dL — ABNORMAL HIGH (ref 65–99)

## 2017-02-18 NOTE — H&P (Signed)
History and Physical    FUTURE YELDELL [id number] DOB: 1924-09-15 DOA: 02/16/2017  PCP: Timmothy Euler, MD  Patient coming from: home  I have personally briefly reviewed patient's old medical records in [place]  Chief Complaint: weakness  HPI: [name] is a 83 y.o. male with medical history significant of HTN, DM, CKD3, HLD,diastolic CHF, was recently discharged from the hospital after being treated for vertigo and orthostasis. Family reports that initially he was doing well and had no complaints. At approximately 4:30pm on 9/20 he began complaining of significant dysuria. Family noted that he was increasingly weak and was having difficulty walking. He began to feel feverish. He did not have any vomiting, diarrhea. He has a chronic cough that is unchanged. His weakness progressed to the point that he could not stand and family had to carry him. He was brought to the ER for evaluation.  ED Course: In the emergency room, he was noted to be afebrile. Hemodynamics were stable. He had elevation of WBC count of 15.2. Creatinine mildly decreased at 1.3. CT scan of the abdomen and pelvis indicated cystitis without any other acute findings. Chest x-ray did not show any signs of pneumonia. Urinalysis indicated a possible infection. He is referred for admission.  Review of Systems: As per HPI otherwise 10 point review of systems negative.    Past Medical History:  Diagnosis Date  . Aortic insufficiency   . Aortic valve sclerosis   . Bronchitis   . CHF (congestive heart failure) ([place])   . Dementia   . Diabetes mellitus    Diet control   . Diverticulosis   . Dyslipidemia   . GERD (gastroesophageal reflux disease)   . Hard of hearing   . Hemorrhoids   . Hernia cerebri ([place])   . History of pneumonia   . Hypertension   . Hypothyroidism   . MI (myocardial infarction) ([place]) 1996   percutaneous coronary intervention   . Small bowel obstruction [facility])     Past Surgical  History:  Procedure Laterality Date  . ABDOMINAL HERNIA REPAIR     multiple sx's  . CATARACT EXTRACTION     left eye  . CHOLECYSTECTOMY    . COLONOSCOPY  2008  . EXPLORATORY LAPAROTOMY W/ BOWEL RESECTION     ventral hernia repair  . TRANSURETHRAL RESECTION OF PROSTATE       reports that he quit smoking about 74 years ago. He has never used smokeless tobacco. He reports that he does not drink alcohol or use drugs.  No Known Allergies  Family History  Problem Relation Age of Onset  . Colon cancer Neg Hx      Prior to Admission medications   Medication Sig Start Date End Date Taking? Authorizing Provider  albuterol (PROVENTIL) (2.5 MG/3ML) 0.083% nebulizer solution Take 3 mLs (2.5 mg total) by nebulization every 6 (six) hours as needed for wheezing or shortness of breath. 07/02/14  Yes Chipper Herb, MD  aspirin 81 MG tablet Take 81 mg by mouth daily.   Yes [provider]  cholecalciferol (VITAMIN D) 1000 units tablet Take 1,000 Units by mouth daily.   Yes [provider]  Diaper Rash Products (DESITIN EX) Apply 1 application topically daily.   Yes [provider]  docusate sodium (COLACE) 100 MG capsule Take 1 capsule (100 mg total) by mouth at bedtime. 11/01/15  Yes Stacks, Cletus Gash, MD  fluticasone (FLONASE) 50 MCG/ACT nasal spray Place 1 spray into both nostrils  2 (two) times daily as needed for allergies or rhinitis. 04/28/16  Yes Dettinger, Fransisca Kaufmann, MD  furosemide (LASIX) 40 MG tablet TAKE (2) TABLETS DAILY 02/12/17  Yes Claretta Fraise, MD  gabapentin (NEURONTIN) 300 MG capsule Take 1 capsule (300 mg total) by mouth at bedtime. 01/30/17  Yes Stacks, Cletus Gash, MD  HYDROcodone-homatropine [facility]) 5-1.5 MG/5ML syrup Take 5 mLs by mouth every 6 (six) hours as needed for cough. 07/24/16  Yes Claretta Fraise, MD  hydrOXYzine (VISTARIL) 25 MG capsule Take 1 capsule (25 mg total) by mouth every 6 (six) hours as needed for anxiety. 06/25/15  Yes Stacks, Cletus Gash, MD    JANUVIA 50 MG tablet TAKE 1 TABLET DAILY 01/15/17  Yes Claretta Fraise, MD  levothyroxine (SYNTHROID, LEVOTHROID) 88 MCG tablet Take 1 tablet (88 mcg total) by mouth daily. 02/12/17  Yes Claretta Fraise, MD  meclizine (ANTIVERT) 25 MG tablet Take 1 tablet (25 mg total) by mouth 3 (three) times daily as needed for dizziness. 07/19/16  Yes Terald Sleeper, PA-C  metoprolol succinate (TOPROL-XL) 25 MG 24 hr tablet TAKE 1 TABLET ONCE DAILY WITH A MEAL 02/12/17  Yes Stacks, Cletus Gash, MD  Natural Senna Laxative 64.8-324 MG TABS Take 1 tablet by mouth at bedtime.    Yes [provider]  Omega-3 Fatty Acids (FISH OIL) 1000 MG CAPS Take 1 capsule by mouth daily.   Yes [provider]  omeprazole (PRILOSEC) 20 MG capsule TAKE (1) CAPSULE DAILY 01/30/17  Yes Claretta Fraise, MD  ondansetron (ZOFRAN) 8 MG tablet Take 1 tablet (8 mg total) by mouth every 8 (eight) hours as needed for nausea or vomiting. 10/17/15  Yes Daleen Bo, MD  potassium chloride (K-DUR,KLOR-CON) 10 MEQ tablet Take 1 tablet 2 (two) times daily. As a potassium supplement 07/10/16  Yes Stacks, Cletus Gash, MD  simvastatin (ZOCOR) 20 MG tablet TAKE 1 TABLET DAILY 01/15/17  Yes Claretta Fraise, MD  traMADol (ULTRAM) 50 MG tablet Take 1 tablet (50 mg total) by mouth 4 (four) times daily as needed for moderate pain. 12/28/15  Yes Claretta Fraise, MD    Physical Exam: Vitals:   02/17/17 1250 02/17/17 1953 02/18/17 0631 02/18/17 1406  BP: (!) 123/54 (!) 113/49 126/65 136/60  Pulse: 71 71 70 94  Resp: 18 18  18   Temp: 98.5 F (36.9 C) 98.3 F (36.8 C) 98.4 F (36.9 C) 98.7 F (37.1 C)  TempSrc: Oral Oral Oral Oral  SpO2: 97% 96% 98% 96%  Weight:        Constitutional: NAD, calm, comfortable Vitals:   02/17/17 1250 02/17/17 1953 02/18/17 0631 02/18/17 1406  BP: (!) 123/54 (!) 113/49 126/65 136/60  Pulse: 71 71 70 94  Resp: 18 18  18   Temp: 98.5 F (36.9 C) 98.3 F (36.8 C) 98.4 F (36.9 C) 98.7 F (37.1 C)  TempSrc: Oral Oral  Oral Oral  SpO2: 97% 96% 98% 96%  Weight:       Eyes: PERRL, lids and conjunctivae normal ENMT: Mucous membranes are moist. Posterior pharynx clear of any exudate or lesions.Normal dentition. Hard of hearing Neck: normal, supple, no masses, no thyromegaly Respiratory: clear to auscultation bilaterally, no wheezing, no crackles. Normal respiratory effort. No accessory muscle use.  Cardiovascular: Regular rate and rhythm, no murmurs / rubs / gallops. No extremity edema. 2+ pedal pulses. No carotid bruits.  Abdomen: no tenderness, no masses palpated. No hepatosplenomegaly. Bowel sounds positive.  Musculoskeletal: no clubbing / cyanosis. No joint deformity upper and lower extremities. Good ROM, no  contractures. Normal muscle tone.  Skin: no rashes, lesions, ulcers. No induration Neurologic: CN 2-12 grossly intact. Sensation intact, DTR normal. Strength 5/5 in all 4.  Psychiatric: answers simple questions appropriately  Labs on Admission: I have personally reviewed following labs and imaging studies  CBC:  Recent Labs Lab 02/13/17 0950 02/14/17 0640 02/16/17 0944 02/17/17 0519 02/18/17 0623  WBC 6.8 6.7 15.2* 14.1* 10.0  NEUTROABS 4.7  --  12.2*  --   --   HGB 13.6 12.4* 13.2 11.7* 11.9*  HCT 40.0 37.5* 38.8* 34.6* 35.7*  MCV 87.5 87.8 86.8 87.6 88.6  PLT 175 172 165 145* 774   Basic Metabolic Panel:  Recent Labs Lab 02/13/17 0950 02/14/17 0640 02/16/17 0944 02/17/17 0519 02/18/17 0623  NA 136 138 132* 134* 137  K 3.6 3.4* 3.4* 3.7 3.9  CL 99* 106 99* 105 107  CO2 27 26 26 25 22   GLUCOSE 245* 176* 237* 188* 153*  BUN 17 15 14 12 13   CREATININE 1.36* 1.16 1.32* 1.29* 1.18  CALCIUM 8.7* 8.3* 8.7* 7.9* 8.2*   GFR: CrCl cannot be calculated (Unknown ideal weight.). Liver Function Tests:  Recent Labs Lab 02/16/17 0944  AST 19  ALT 25  ALKPHOS 66  BILITOT 1.1  PROT 6.6  ALBUMIN 3.3*    Recent Labs Lab 02/16/17 0944  LIPASE 20   No results for input(s):  AMMONIA in the last 168 hours. Coagulation Profile: No results for input(s): INR, PROTIME in the last 168 hours. Cardiac Enzymes:  Recent Labs Lab 02/13/17 0950 02/16/17 0944  TROPONINI <0.03 <0.03   BNP (last 3 results) No results for input(s): PROBNP in the last 8760 hours. HbA1C: No results for input(s): HGBA1C in the last 72 hours. CBG:  Recent Labs Lab 02/17/17 1607 02/17/17 1954 02/18/17 0735 02/18/17 1121 02/18/17 1607  GLUCAP 151* 165* 150* 155* 134*   Lipid Profile: No results for input(s): CHOL, HDL, LDLCALC, TRIG, CHOLHDL, LDLDIRECT in the last 72 hours. Thyroid Function Tests: No results for input(s): TSH, T4TOTAL, FREET4, T3FREE, THYROIDAB in the last 72 hours. Anemia Panel: No results for input(s): VITAMINB12, FOLATE, FERRITIN, TIBC, IRON, RETICCTPCT in the last 72 hours. Urine analysis:    Component Value Date/Time   COLORURINE AMBER (A) 02/16/2017 0944   APPEARANCEUR HAZY (A) 02/16/2017 0944   APPEARANCEUR Clear 08/31/2015 1526   LABSPEC 1.027 02/16/2017 0944   PHURINE 8.0 02/16/2017 0944   GLUCOSEU >=500 (A) 02/16/2017 0944   HGBUR MODERATE (A) 02/16/2017 0944   BILIRUBINUR NEGATIVE 02/16/2017 0944   BILIRUBINUR Negative 08/31/2015 1526   KETONESUR NEGATIVE 02/16/2017 0944   PROTEINUR 100 (A) 02/16/2017 0944   UROBILINOGEN 0.2 08/11/2010 1214   NITRITE POSITIVE (A) 02/16/2017 0944   LEUKOCYTESUR LARGE (A) 02/16/2017 0944   LEUKOCYTESUR Trace (A) 08/31/2015 1526    Radiological Exams on Admission: No results found.  Assessment/Plan Active Problems:   Diabetes mellitus type II, non insulin dependent (HCC)   Hyperlipidemia   Essential hypertension   Hypothyroid   Dehydration   CKD (chronic kidney disease), stage III   Chronic diastolic CHF (congestive heart failure) (HCC)   Hypokalemia   UTI (urinary tract infection)   Generalized weakness   Pressure injury of skin   1. Urinary tract infection. Start the patient on Rocephin. Urine  culture has been sent already followed up. 2. Generalized weakness. Suspect is related to dehydration and UTI. Will get physical therapy evaluation. 3. Hypertension. Stable. Continue outpatient regimen. 4. Chronic diastolic CHF. Will hold  further Lasix since creatinine is elevated. Monitor for signs of volume overload.  5. Chronic kidney disease stage III. He's had a mild elevation of creatinine at 1.3. Anticipate this should improve with IV hydration. 6. Hypothyroidism. Continue Synthroid 7. Diabetes. Continue on treatment. His sliding scale insulin. 8. Hyperlipidemia. Continue statin 9. Hypokalemia. Replace  DVT prophylaxis: lovenox Code Status: full code Family Communication: discussed with daughter (POA) and wife at the bedside Disposition Plan: pending physical therapy evaluation Consults called:  Admission status: observation, Dollene Cleveland MD [facility] Pager [phone]  If 7PM-7AM, please contact night-coverage [email or web address] Password [facility]  02/18/2017, 6:40 PM

## 2017-02-18 NOTE — Progress Notes (Signed)
PROGRESS NOTE    [name]  IRJ:[id number] DOB: 1925-05-02 DOA: 02/16/2017 PCP: Timmothy Euler, MD    Brief Narrative:  83 year old male was brought to the hospital with complaints of weakness. He was found to have dehydration and urinary tract infection. Patient was unable to stand and had to be carried by his family. He is on IV fluids and antibiotics. Urine culture has been sent.   Assessment & Plan:   Active Problems:   Diabetes mellitus type II, non insulin dependent (HCC)   Hyperlipidemia   Essential hypertension   Hypothyroid   Dehydration   CKD (chronic kidney disease), stage III   Chronic diastolic CHF (congestive heart failure) (HCC)   Hypokalemia   UTI (urinary tract infection)   Generalized weakness   Pressure injury of skin   1. Urinary tract infection. Patient is on Rocephin. Urine culture positive for Proteus. Follow-up sensitivities 2. Generalized weakness. Suspect is related to dehydration and UTI. Physical therapy has evaluated the patient and recommended home health physical therapy. 3. Hypertension. Stable. Continue outpatient regimen. 4. Chronic diastolic CHF. Appears euvolemic. Restart Lasix on discharge.  5. Chronic kidney disease stage III. He's had a mild elevation of creatinine at 1.3. Improved with IV hydration. 6. Hypothyroidism. Continue Synthroid 7. Diabetes. Continue on treatment. Continue sliding scale insulin. Blood sugars have been stable 8. Hyperlipidemia. Continue statin 9. Hypokalemia. Replace   DVT prophylaxis: lovenox Code Status: Full code Family Communication: No family present. Discussed with daughter on 9/22 Disposition Plan: Discharge home when improved, likely an a.m.  Consultants:     Procedures:     Antimicrobials:   Rocephin 9/21>>   Subjective: Still has some dysuria. Denies any shortness of breath.  Objective: Vitals:   02/17/17 1250 02/17/17 1953 02/18/17 0631 02/18/17 1406  BP: (!) 123/54 (!)  113/49 126/65 136/60  Pulse: 71 71 70 94  Resp: 18 18  18   Temp: 98.5 F (36.9 C) 98.3 F (36.8 C) 98.4 F (36.9 C) 98.7 F (37.1 C)  TempSrc: Oral Oral Oral Oral  SpO2: 97% 96% 98% 96%  Weight:        Intake/Output Summary (Last 24 hours) at 02/18/17 1823 Last data filed at 02/18/17 1500  Gross per 24 hour  Intake              945 ml  Output              700 ml  Net              245 ml   Filed Weights   02/16/17 0910 02/16/17 1523  Weight: 93 kg (205 lb) 93.4 kg (205 lb 14.6 oz)    Examination:  General exam: Appears calm and comfortable  Respiratory system: Clear to auscultation. Respiratory effort normal. Cardiovascular system: S1 & S2 heard, RRR. No JVD, murmurs, rubs, gallops or clicks. No pedal edema. Gastrointestinal system: Abdomen is nondistended, soft and nontender. No organomegaly or masses felt. Normal bowel sounds heard. Central nervous system: Alert and oriented. No focal neurological deficits. Extremities: Symmetric 5 x 5 power. Skin: No rashes, lesions or ulcers Psychiatry: Judgement and insight appear normal. Mood & affect appropriate.     Data Reviewed: I have personally reviewed following labs and imaging studies  CBC:  Recent Labs Lab 02/13/17 0950 02/14/17 0640 02/16/17 0944 02/17/17 0519 02/18/17 0623  WBC 6.8 6.7 15.2* 14.1* 10.0  NEUTROABS 4.7  --  12.2*  --   --   HGB 13.6  12.4* 13.2 11.7* 11.9*  HCT 40.0 37.5* 38.8* 34.6* 35.7*  MCV 87.5 87.8 86.8 87.6 88.6  PLT 175 172 165 145* 258   Basic Metabolic Panel:  Recent Labs Lab 02/13/17 0950 02/14/17 0640 02/16/17 0944 02/17/17 0519 02/18/17 0623  NA 136 138 132* 134* 137  K 3.6 3.4* 3.4* 3.7 3.9  CL 99* 106 99* 105 107  CO2 27 26 26 25 22   GLUCOSE 245* 176* 237* 188* 153*  BUN 17 15 14 12 13   CREATININE 1.36* 1.16 1.32* 1.29* 1.18  CALCIUM 8.7* 8.3* 8.7* 7.9* 8.2*   GFR: CrCl cannot be calculated (Unknown ideal weight.). Liver Function Tests:  Recent Labs Lab  02/16/17 0944  AST 19  ALT 25  ALKPHOS 66  BILITOT 1.1  PROT 6.6  ALBUMIN 3.3*    Recent Labs Lab 02/16/17 0944  LIPASE 20   No results for input(s): AMMONIA in the last 168 hours. Coagulation Profile: No results for input(s): INR, PROTIME in the last 168 hours. Cardiac Enzymes:  Recent Labs Lab 02/13/17 0950 02/16/17 0944  TROPONINI <0.03 <0.03   BNP (last 3 results) No results for input(s): PROBNP in the last 8760 hours. HbA1C: No results for input(s): HGBA1C in the last 72 hours. CBG:  Recent Labs Lab 02/17/17 1607 02/17/17 1954 02/18/17 0735 02/18/17 1121 02/18/17 1607  GLUCAP 151* 165* 150* 155* 134*   Lipid Profile: No results for input(s): CHOL, HDL, LDLCALC, TRIG, CHOLHDL, LDLDIRECT in the last 72 hours. Thyroid Function Tests: No results for input(s): TSH, T4TOTAL, FREET4, T3FREE, THYROIDAB in the last 72 hours. Anemia Panel: No results for input(s): VITAMINB12, FOLATE, FERRITIN, TIBC, IRON, RETICCTPCT in the last 72 hours. Sepsis Labs:  Recent Labs Lab 02/16/17 0955  LATICACIDVEN 1.19    Recent Results (from the past 240 hour(s))  Urine culture     Status: Abnormal (Preliminary result)   Collection Time: 02/16/17 11:56 AM  Result Value Ref Range Status   Specimen Description URINE, CLEAN CATCH  Final   Special Requests NONE  Final   Culture (A)  Final    >=100,000 COLONIES/mL PROTEUS MIRABILIS SUSCEPTIBILITIES TO FOLLOW Performed at [facility], [address] [place],  [address]    Report Status PENDING  Incomplete         Radiology Studies: No results found.      Scheduled Meds: . aspirin  81 mg Oral Daily  . docusate sodium  100 mg Oral QHS  . enoxaparin (LOVENOX) injection  40 mg Subcutaneous Q24H  . gabapentin  300 mg Oral QHS  . insulin aspart  0-15 Units Subcutaneous TID WC  . insulin aspart  0-5 Units Subcutaneous QHS  . levothyroxine  88 mcg Oral QAC breakfast  . linagliptin  5 mg Oral Daily  .  metoprolol succinate  25 mg Oral Daily  . pantoprazole  40 mg Oral Daily  . potassium chloride  10 mEq Oral BID  . senna  1 tablet Oral QHS  . simvastatin  20 mg Oral q1800   Continuous Infusions: . 0.9 % NaCl with KCl 20 mEq / L 75 mL/hr at 02/18/17 1042  . cefTRIAXone (ROCEPHIN)  IV Stopped (02/18/17 1226)     LOS: 0 days    Time spent: 48mins    Rachana Malesky, MD [facility] Pager [phone]  If 7PM-7AM, please contact night-coverage [email or web address] Password [email or web address] 02/18/2017, 6:23 PM

## 2017-02-19 DIAGNOSIS — E119 Type 2 diabetes mellitus without complications: Secondary | ICD-10-CM | POA: Diagnosis not present

## 2017-02-19 DIAGNOSIS — E876 Hypokalemia: Secondary | ICD-10-CM | POA: Diagnosis not present

## 2017-02-19 DIAGNOSIS — N183 Chronic kidney disease, stage 3 (moderate): Secondary | ICD-10-CM | POA: Diagnosis not present

## 2017-02-19 DIAGNOSIS — N3001 Acute cystitis with hematuria: Secondary | ICD-10-CM | POA: Diagnosis not present

## 2017-02-19 DIAGNOSIS — R531 Weakness: Secondary | ICD-10-CM | POA: Diagnosis not present

## 2017-02-19 DIAGNOSIS — I5032 Chronic diastolic (congestive) heart failure: Secondary | ICD-10-CM | POA: Diagnosis not present

## 2017-02-19 DIAGNOSIS — E785 Hyperlipidemia, unspecified: Secondary | ICD-10-CM | POA: Diagnosis not present

## 2017-02-19 DIAGNOSIS — E860 Dehydration: Secondary | ICD-10-CM | POA: Diagnosis not present

## 2017-02-19 DIAGNOSIS — I10 Essential (primary) hypertension: Secondary | ICD-10-CM | POA: Diagnosis not present

## 2017-02-19 LAB — URINE CULTURE

## 2017-02-19 LAB — GLUCOSE, CAPILLARY
Glucose-Capillary: 129 mg/dL — ABNORMAL HIGH (ref 65–99)
Glucose-Capillary: 135 mg/dL — ABNORMAL HIGH (ref 65–99)

## 2017-02-19 MED ORDER — AMOXICILLIN-POT CLAVULANATE 875-125 MG PO TABS: 1.0000 | ORAL_TABLET | Freq: Two times a day (BID) | ORAL | 0 refills | Status: DC

## 2017-02-19 MED ORDER — AMOXICILLIN-POT CLAVULANATE 875-125 MG PO TABS: 1.0000 | ORAL_TABLET | Freq: Two times a day (BID) | ORAL | 0 refills | Status: AC

## 2017-02-19 NOTE — Care Management Note (Signed)
Case Management Note  Patient Details  Name: [name] MRN: [id number] Date of Birth: 1924/06/05  Subjective/Objective:                    Action/Plan:   Expected Discharge Date:  02/19/17               Expected Discharge Plan:  [place]  In-House Referral:     Discharge planning Services  CM Consult  Post Acute Care Choice:  Home Health, Resumption of Svcs/PTA Provider Choice offered to:     DME Arranged:    DME Agency:     HH Arranged:    [place] Agency:  [place]  Status of Service:  Completed, signed off  If discussed at [place] of Stay Meetings, dates discussed:    Additional Comments: Patient discharging home today. Patient active with [facility] for RN and PT prior to arrival. Patient is in OBS and no resumption order needed. Vaughan Basta of [facility] aware of discharge.   Montie Gelardi, Chauncey Reading, RN 02/19/2017, 12:43 PM

## 2017-02-19 NOTE — Discharge Summary (Signed)
Physician Discharge Summary  [name] DJM:[id number] DOB: [birth year] DOA: 02/16/2017  PCP: Timmothy Euler, MD  Admit date: 02/16/2017 Discharge date: 02/19/2017  Admitted From: home Disposition:  home  Recommendations for Outpatient Follow-up:  1. Follow up with PCP in 1-2 weeks 2. Please obtain BMP/CBC in one week  Home Health: [facility] Equipment/Devices:  Discharge Condition: stable CODE STATUS: full code Diet recommendation: Heart Healthy / Carb Modified    Brief/Interim Summary: 83 year old male was brought to the hospital with complaints of weakness. He was found to have dehydration and urinary tract infection. Patient was unable to stand and had to be carried by his family. He was started on IV fluids and antibiotics  Discharge Diagnoses:  Active Problems:   Diabetes mellitus type II, non insulin dependent (HCC)   Hyperlipidemia   Essential hypertension   Hypothyroid   Dehydration   CKD (chronic kidney disease), stage III   Chronic diastolic CHF (congestive heart failure) (HCC)   Hypokalemia   UTI (urinary tract infection)   Generalized weakness   Pressure injury of skin  1. Urinary tract infection. Patient was treated with Rocephin. Urine culture positive for Proteus. Based on sensitivities, he will be transitioned to augmentin 2. Generalized weakness. Suspect is related to dehydration and UTI. Physical therapy has evaluated the patient and recommended home health physical therapy. 3. Hypertension. Stable. Continue outpatient regimen. 4. Chronic diastolic CHF. Appears euvolemic. Restart Lasix on discharge.  5. Chronic kidney disease stage III. He's had a mild elevation of creatinine at 1.3. Improved with IV hydration. 6. Hypothyroidism. Continue Synthroid 7. Diabetes. Blood sugars stable. Resume home regimen on discharge 8. Hyperlipidemia. Continue statin 9. Hypokalemia. Replaced  Discharge Instructions  Discharge Instructions    Diet - low sodium heart  healthy    Complete by:  As directed    Increase activity slowly    Complete by:  As directed      Allergies as of 02/19/2017   No Known Allergies     Medication List    TAKE these medications   albuterol (2.5 MG/3ML) 0.083% nebulizer solution Commonly known as:  PROVENTIL Take 3 mLs (2.5 mg total) by nebulization every 6 (six) hours as needed for wheezing or shortness of breath.   amoxicillin-clavulanate 875-125 MG tablet Commonly known as:  AUGMENTIN Take 1 tablet by mouth 2 (two) times daily.   aspirin 81 MG tablet Take 81 mg by mouth daily.   cholecalciferol 1000 units tablet Commonly known as:  VITAMIN D Take 1,000 Units by mouth daily.   DESITIN EX Apply 1 application topically daily.   docusate sodium 100 MG capsule Commonly known as:  COLACE Take 1 capsule (100 mg total) by mouth at bedtime.   Fish Oil 1000 MG Caps Take 1 capsule by mouth daily.   fluticasone 50 MCG/ACT nasal spray Commonly known as:  FLONASE Place 1 spray into both nostrils 2 (two) times daily as needed for allergies or rhinitis.   furosemide 40 MG tablet Commonly known as:  LASIX TAKE (2) TABLETS DAILY   gabapentin 300 MG capsule Commonly known as:  NEURONTIN Take 1 capsule (300 mg total) by mouth at bedtime.   HYDROcodone-homatropine 5-1.5 MG/5ML syrup Commonly known as:  HYCODAN Take 5 mLs by mouth every 6 (six) hours as needed for cough.   hydrOXYzine 25 MG capsule Commonly known as:  VISTARIL Take 1 capsule (25 mg total) by mouth every 6 (six) hours as needed for anxiety.   JANUVIA 50 MG tablet  Generic drug:  sitaGLIPtin TAKE 1 TABLET DAILY   levothyroxine 88 MCG tablet Commonly known as:  SYNTHROID, LEVOTHROID Take 1 tablet (88 mcg total) by mouth daily.   meclizine 25 MG tablet Commonly known as:  ANTIVERT Take 1 tablet (25 mg total) by mouth 3 (three) times daily as needed for dizziness.   metoprolol succinate 25 MG 24 hr tablet Commonly known as:  TOPROL-XL TAKE  1 TABLET ONCE DAILY WITH A MEAL   NATURAL SENNA LAXATIVE 64.8-324 MG Tabs Take 1 tablet by mouth at bedtime.   omeprazole 20 MG capsule Commonly known as:  PRILOSEC TAKE (1) CAPSULE DAILY   ondansetron 8 MG tablet Commonly known as:  ZOFRAN Take 1 tablet (8 mg total) by mouth every 8 (eight) hours as needed for nausea or vomiting.   potassium chloride 10 MEQ tablet Commonly known as:  K-DUR,KLOR-CON Take 1 tablet 2 (two) times daily. As a potassium supplement   simvastatin 20 MG tablet Commonly known as:  ZOCOR TAKE 1 TABLET DAILY   traMADol 50 MG tablet Commonly known as:  ULTRAM Take 1 tablet (50 mg total) by mouth 4 (four) times daily as needed for moderate pain.            Discharge Care Instructions        Start     Ordered   02/19/17 0000  amoxicillin-clavulanate (AUGMENTIN) 875-125 MG tablet  2 times daily     02/19/17 1223   02/19/17 0000  Increase activity slowly     02/19/17 1223   02/19/17 0000  Diet - low sodium heart healthy     02/19/17 1223      No Known Allergies  Consultations:     Procedures/Studies: Dg Chest 1 View  Result Date: 02/16/2017 CLINICAL DATA:  Shortness of breath. EXAM: CHEST 1 VIEW COMPARISON:  07/24/2016. FINDINGS: Mediastinum and hilar structures normal. Stable cardiomegaly. Normal pulmonary vascularity. No focal infiltrate. No pleural effusion or pneumothorax. Thoracolumbar spine scoliosis. Degenerative changes both shoulders . IMPRESSION: No acute cardiopulmonary disease.  Stable cardiomegaly. Electronically Signed   By: Marcello Moores  Register   On: 02/16/2017 11:22   Mr Brain Wo Contrast  Result Date: 02/13/2017 CLINICAL DATA:  Initial evaluation for persistent vertigo. EXAM: MRI HEAD WITHOUT CONTRAST TECHNIQUE: Multiplanar, multiecho pulse sequences of the brain and surrounding structures were obtained without intravenous contrast. COMPARISON:  Comparison made with prior CT from 10/17/2015. FINDINGS: Brain: Study fairly  degraded by motion artifact. Diffuse prominence of the CSF containing spaces compatible with generalized age related cerebral atrophy, moderate nature. Mild chronic microvascular ischemic changes present within the periventricular white matter. No definite abnormal foci of restricted diffusion to suggest acute or subacute ischemia. Gray-white matter differentiation maintained. No definite encephalomalacia to suggest chronic infarction. No appreciable foci of susceptibility fact to suggest acute or chronic intracranial hemorrhage. No mass lesion, midline shift or mass effect. Diffuse ventricular prominence related to global parenchymal volume loss without hydrocephalus. No extra-axial fluid collection. Vascular: Right vertebral artery likely hypoplastic and not well visualized. Major intracranial vascular flow voids otherwise grossly maintained. Skull and upper cervical spine: Craniocervical junction not well evaluated on this motion degraded exam, but grossly unremarkable. Upper cervical spine within normal limits. Bone marrow signal intensity grossly within normal limits. Scalp soft tissues unremarkable. Sinuses/Orbits: Globes and oval soft tissues grossly unremarkable. Patient status post lens extraction bilaterally. Paranasal sinuses grossly clear. No appreciable mastoid effusion. Other: None. IMPRESSION: 1. Motion degraded exam. 2. No acute intracranial abnormality identified. 3.  Moderate age-related cerebral atrophy. Electronically Signed   By: Jeannine Boga M.D.   On: 02/13/2017 16:46   Ct Abdomen Pelvis W Contrast  Result Date: 02/16/2017 CLINICAL DATA:  Weakness, abdominal swelling, nausea, history of diverticulitis EXAM: CT ABDOMEN AND PELVIS WITH CONTRAST TECHNIQUE: Multidetector CT imaging of the abdomen and pelvis was performed using the standard protocol following bolus administration of intravenous contrast. CONTRAST:  37mL ISOVUE-300 IOPAMIDOL (ISOVUE-300) INJECTION 61% COMPARISON:   10/17/2015 FINDINGS: Lower chest: Lung bases are clear. Hepatobiliary: Liver is within normal limits. Status post cholecystectomy. No intrahepatic or extrahepatic ductal dilatation. Pancreas: Within normal limits. Spleen: Within normal limits. Adrenals/Urinary Tract: Adrenal glands are within normal limits. Kidneys are within normal limits.  No hydronephrosis. Bladder is underdistended but thick-walled with perivesical inflammatory changes. Stomach/Bowel: Stomach is within normal limits. No evidence of bowel obstruction. Normal appendix (series 3/image 31). Mild left colonic diverticulosis, without convincing inflammatory changes. Vascular/Lymphatic: No evidence of abdominal aortic aneurysm. Atherosclerotic calcifications of the abdominal aorta and branch vessels. No suspicious abdominopelvic lymphadenopathy. Reproductive: Prostatomegaly with dystrophic calcifications. Other: No abdominopelvic ascites. Diastases of the anterior abdominal wall with abdominal wall laxity/large ventral hernia. Small fat containing left inguinal hernia. Musculoskeletal: Degenerative changes of the visualized thoracolumbar spine. Prominent superior endplate Schmorl's node at L5. Mild superior endplate changes at L4. IMPRESSION: Wall thickening/inflammatory changes involving the bladder, favoring cystitis. Colonic diverticulosis, without evidence of diverticulitis. No evidence of bowel obstruction. Normal appendix. Additional stable ancillary findings as above. Electronically Signed   By: Julian Hy M.D.   On: 02/16/2017 11:33       Subjective: Hard of hearing, no new complaints  Discharge Exam: Vitals:   02/19/17 0508 02/19/17 0900  BP: 118/62   Pulse: (!) 58   Resp: 16   Temp: 99.1 F (37.3 C)   SpO2: 98% 94%   Vitals:   02/18/17 1406 02/18/17 2048 02/19/17 0508 02/19/17 0900  BP: 136/60 (!) 130/54 118/62   Pulse: 94 70 (!) 58   Resp: 18 18 16    Temp: 98.7 F (37.1 C) 99.4 F (37.4 C) 99.1 F (37.3 C)    TempSrc: Oral Oral Oral   SpO2: 96% 99% 98% 94%  Weight:        General: Pt is alert, awake, not in acute distress Cardiovascular: RRR, S1/S2 +, no rubs, no gallops Respiratory: CTA bilaterally, no wheezing, no rhonchi Abdominal: Soft, NT, ND, bowel sounds + Extremities: no edema, no cyanosis    The results of significant diagnostics from this hospitalization (including imaging, microbiology, ancillary and laboratory) are listed below for reference.     Microbiology: Recent Results (from the past 240 hour(s))  Urine culture     Status: Abnormal   Collection Time: 02/16/17 11:56 AM  Result Value Ref Range Status   Specimen Description URINE, CLEAN CATCH  Final   Special Requests NONE  Final   Culture >=100,000 COLONIES/mL PROTEUS MIRABILIS (A)  Final   Report Status 02/19/2017 FINAL  Final   Organism ID, Bacteria PROTEUS MIRABILIS (A)  Final      Susceptibility   Proteus mirabilis - MIC*    AMPICILLIN <=2 SENSITIVE Sensitive     CEFAZOLIN <=4 SENSITIVE Sensitive     CEFTRIAXONE <=1 SENSITIVE Sensitive     CIPROFLOXACIN <=0.25 SENSITIVE Sensitive     GENTAMICIN <=1 SENSITIVE Sensitive     IMIPENEM 2 SENSITIVE Sensitive     NITROFURANTOIN 128 RESISTANT Resistant     TRIMETH/SULFA >=320 RESISTANT Resistant  AMPICILLIN/SULBACTAM <=2 SENSITIVE Sensitive     PIP/TAZO <=4 SENSITIVE Sensitive     * >=100,000 COLONIES/mL PROTEUS MIRABILIS     Labs: BNP (last 3 results)  Recent Labs  02/16/17 0944  BNP 78.2   Basic Metabolic Panel:  Recent Labs Lab 02/13/17 0950 02/14/17 0640 02/16/17 0944 02/17/17 0519 02/18/17 0623  NA 136 138 132* 134* 137  K 3.6 3.4* 3.4* 3.7 3.9  CL 99* 106 99* 105 107  CO2 27 26 26 25 22   GLUCOSE 245* 176* 237* 188* 153*  BUN 17 15 14 12 13   CREATININE 1.36* 1.16 1.32* 1.29* 1.18  CALCIUM 8.7* 8.3* 8.7* 7.9* 8.2*   Liver Function Tests:  Recent Labs Lab 02/16/17 0944  AST 19  ALT 25  ALKPHOS 66  BILITOT 1.1  PROT 6.6   ALBUMIN 3.3*    Recent Labs Lab 02/16/17 0944  LIPASE 20   No results for input(s): AMMONIA in the last 168 hours. CBC:  Recent Labs Lab 02/13/17 0950 02/14/17 0640 02/16/17 0944 02/17/17 0519 02/18/17 0623  WBC 6.8 6.7 15.2* 14.1* 10.0  NEUTROABS 4.7  --  12.2*  --   --   HGB 13.6 12.4* 13.2 11.7* 11.9*  HCT 40.0 37.5* 38.8* 34.6* 35.7*  MCV 87.5 87.8 86.8 87.6 88.6  PLT 175 172 165 145* 152   Cardiac Enzymes:  Recent Labs Lab 02/13/17 0950 02/16/17 0944  TROPONINI <0.03 <0.03   BNP: Invalid input(s): POCBNP CBG:  Recent Labs Lab 02/18/17 1121 02/18/17 1607 02/18/17 2046 02/19/17 0803 02/19/17 1137  GLUCAP 155* 134* 151* 129* 135*   D-Dimer No results for input(s): DDIMER in the last 72 hours. Hgb A1c No results for input(s): HGBA1C in the last 72 hours. Lipid Profile No results for input(s): CHOL, HDL, LDLCALC, TRIG, CHOLHDL, LDLDIRECT in the last 72 hours. Thyroid function studies No results for input(s): TSH, T4TOTAL, T3FREE, THYROIDAB in the last 72 hours.  Invalid input(s): FREET3 Anemia work up No results for input(s): VITAMINB12, FOLATE, FERRITIN, TIBC, IRON, RETICCTPCT in the last 72 hours. Urinalysis    Component Value Date/Time   COLORURINE AMBER (A) 02/16/2017 0944   APPEARANCEUR HAZY (A) 02/16/2017 0944   APPEARANCEUR Clear 08/31/2015 1526   LABSPEC 1.027 02/16/2017 0944   PHURINE 8.0 02/16/2017 0944   GLUCOSEU >=500 (A) 02/16/2017 0944   HGBUR MODERATE (A) 02/16/2017 0944   BILIRUBINUR NEGATIVE 02/16/2017 0944   BILIRUBINUR Negative 08/31/2015 1526   KETONESUR NEGATIVE 02/16/2017 0944   PROTEINUR 100 (A) 02/16/2017 0944   UROBILINOGEN 0.2 08/11/2010 1214   NITRITE POSITIVE (A) 02/16/2017 0944   LEUKOCYTESUR LARGE (A) 02/16/2017 0944   LEUKOCYTESUR Trace (A) 08/31/2015 1526   Sepsis Labs Invalid input(s): PROCALCITONIN,  WBC,  LACTICIDVEN Microbiology Recent Results (from the past 240 hour(s))  Urine culture     Status:  Abnormal   Collection Time: 02/16/17 11:56 AM  Result Value Ref Range Status   Specimen Description URINE, CLEAN CATCH  Final   Special Requests NONE  Final   Culture >=100,000 COLONIES/mL PROTEUS MIRABILIS (A)  Final   Report Status 02/19/2017 FINAL  Final   Organism ID, Bacteria PROTEUS MIRABILIS (A)  Final      Susceptibility   Proteus mirabilis - MIC*    AMPICILLIN <=2 SENSITIVE Sensitive     CEFAZOLIN <=4 SENSITIVE Sensitive     CEFTRIAXONE <=1 SENSITIVE Sensitive     CIPROFLOXACIN <=0.25 SENSITIVE Sensitive     GENTAMICIN <=1 SENSITIVE Sensitive  IMIPENEM 2 SENSITIVE Sensitive     NITROFURANTOIN 128 RESISTANT Resistant     TRIMETH/SULFA >=320 RESISTANT Resistant     AMPICILLIN/SULBACTAM <=2 SENSITIVE Sensitive     PIP/TAZO <=4 SENSITIVE Sensitive     * >=100,000 COLONIES/mL PROTEUS MIRABILIS     Time coordinating discharge: Over 30 minutes  SIGNED:   Kathie Dike, MD  [facility] 02/19/2017, 12:23 PM Pager   If 7PM-7AM, please contact night-coverage [email or web address] Password [email or web address]

## 2017-02-19 NOTE — Progress Notes (Signed)
Physical Therapy Treatment Patient Details Name: [name] MRN: [id number] DOB: Jan 18, 1925 Today's Date: 02/19/2017    History of Present Illness Patient recently seen in emergency department and admitted for persistent vertigo. Discharged home. EMS called due to ongoing weakness. Unable to get up out of bed. Foul-smelling urine and incontinence. Patient is unable to contribute to history given dementia and extremely hard of hearing. Level V caveat.    PT Comments    Patient demonstrates increased endurance for gait training, required frequent verbal/tactile cueing mostly due to very hard of hearing and limited secondary to fatigue.  Patient will benefit from continued physical therapy in hospital and recommended venue below to increase strength, balance, endurance for safe ADLs and gait.   Follow Up Recommendations  Home health PT;Supervision/Assistance - 24 hour     Equipment Recommendations  None recommended by PT    Recommendations for Other Services       Precautions / Restrictions Precautions Precautions: Fall Precaution Comments: Very hard of hearing Restrictions Weight Bearing Restrictions: No    Mobility  Bed Mobility Overal bed mobility: Needs Assistance Bed Mobility: Supine to Sit;Sit to Supine     Supine to sit: Min guard Sit to supine: Min guard      Transfers Overall transfer level: Needs assistance Equipment used: Rolling walker (2 wheeled) Transfers: Sit to/from [facility] Sit to Stand: Min guard Stand pivot transfers: Min guard          Ambulation/Gait Ambulation/Gait assistance: Min guard Ambulation Distance (Feet): 50 Feet Assistive device: Rolling walker (2 wheeled) Gait Pattern/deviations: Decreased step length - right;Decreased step length - left;Decreased stride length   Gait velocity interpretation: Below normal speed for age/gender General Gait Details: Patient demonstrates slightly labored slow cadence without  loss of balance, limited secondary to fatigue   Stairs            Wheelchair Mobility    Modified Rankin (Stroke Patients Only)       Balance Overall balance assessment: Needs assistance Sitting-balance support: No upper extremity supported;Feet supported Sitting balance-Leahy Scale: Good     Standing balance support: Bilateral upper extremity supported;During functional activity Standing balance-Leahy Scale: Fair                              Cognition Arousal/Alertness: Awake/alert Behavior During Therapy: WFL for tasks assessed/performed;Flat affect Overall Cognitive Status: History of cognitive impairments - at baseline                                 General Comments: very hard of hearing      Exercises      General Comments        Pertinent Vitals/Pain Pain Assessment: Faces Faces Pain Scale: No hurt    Home Living                      Prior Function            PT Goals (current goals can now be found in the care plan section) Acute Rehab PT Goals Patient Stated Goal: return home with family to assist PT Goal Formulation: Patient unable to participate in goal setting Time For Goal Achievement: 02/21/17 Potential to Achieve Goals: Fair Progress towards PT goals: Progressing toward goals    Frequency    Min 3X/week      PT Plan  Current plan remains appropriate    Co-evaluation              AM-PAC PT "6 Clicks" Daily Activity  Outcome Measure  Difficulty turning over in bed (including adjusting bedclothes, sheets and blankets)?: Unable Difficulty moving from lying on back to sitting on the side of the bed? : Unable Difficulty sitting down on and standing up from a chair with arms (e.g., wheelchair, bedside commode, etc,.)?: Unable Help needed moving to and from a bed to chair (including a wheelchair)?: A Little Help needed walking in hospital room?: A Little Help needed climbing 3-5 steps with  a railing? : A Little 6 Click Score: 12    End of Session Equipment Utilized During Treatment: Gait belt   Patient left: in bed;with call bell/phone within reach;with bed alarm set Nurse Communication: Mobility status PT Visit Diagnosis: Unsteadiness on feet (R26.81);Other abnormalities of gait and mobility (R26.89);Muscle weakness (generalized) (M62.81)     Time: 1410-1437 PT Time Calculation (min) (ACUTE ONLY): 27 min  Charges:  $Therapeutic Activity: 23-37 mins                    G Codes:  Functional Assessment Tool Used: AM-PAC 6 Clicks Basic Mobility Functional Limitation: Mobility: Walking and moving around Mobility: Walking and Moving Around Current Status ([id number]): At least 60 percent but less than 80 percent impaired, limited or restricted Mobility: Walking and Moving Around Goal Status [phone]): At least 60 percent but less than 80 percent impaired, limited or restricted Mobility: Walking and Moving Around Discharge Status [phone]): At least 20 percent but less than 40 percent impaired, limited or restricted    2:54 PM, 02/19/17 Lonell Grandchild, MPT Physical Therapist with [facility] [phone] office [phone] mobile phone

## 2017-02-20 ENCOUNTER — Other Ambulatory Visit: Payer: Self-pay | Admitting: Family Medicine

## 2017-02-21 ENCOUNTER — Telehealth: Payer: Self-pay | Admitting: Family Medicine

## 2017-02-21 DIAGNOSIS — Z7984 Long term (current) use of oral hypoglycemic drugs: Secondary | ICD-10-CM | POA: Diagnosis not present

## 2017-02-21 DIAGNOSIS — E1122 Type 2 diabetes mellitus with diabetic chronic kidney disease: Secondary | ICD-10-CM | POA: Diagnosis not present

## 2017-02-21 DIAGNOSIS — Z6832 Body mass index (BMI) 32.0-32.9, adult: Secondary | ICD-10-CM | POA: Diagnosis not present

## 2017-02-21 DIAGNOSIS — I5032 Chronic diastolic (congestive) heart failure: Secondary | ICD-10-CM | POA: Diagnosis not present

## 2017-02-21 DIAGNOSIS — N183 Chronic kidney disease, stage 3 (moderate): Secondary | ICD-10-CM | POA: Diagnosis not present

## 2017-02-21 DIAGNOSIS — N390 Urinary tract infection, site not specified: Secondary | ICD-10-CM | POA: Diagnosis not present

## 2017-02-21 DIAGNOSIS — I130 Hypertensive heart and chronic kidney disease with heart failure and stage 1 through stage 4 chronic kidney disease, or unspecified chronic kidney disease: Secondary | ICD-10-CM | POA: Diagnosis not present

## 2017-02-21 DIAGNOSIS — Z7982 Long term (current) use of aspirin: Secondary | ICD-10-CM | POA: Diagnosis not present

## 2017-02-21 NOTE — Telephone Encounter (Signed)
Stacks patient - never seen [place].

## 2017-02-21 NOTE — Telephone Encounter (Signed)
Dr. Livia Snellen sent in refills for patient and informed that will need to come in for labs before next refill

## 2017-02-21 NOTE — Telephone Encounter (Signed)
Last seen 07/24/16  Dr Livia Snellen

## 2017-02-22 ENCOUNTER — Ambulatory Visit: Payer: Medicare HMO

## 2017-02-22 DIAGNOSIS — Z7982 Long term (current) use of aspirin: Secondary | ICD-10-CM | POA: Diagnosis not present

## 2017-02-22 DIAGNOSIS — Z6832 Body mass index (BMI) 32.0-32.9, adult: Secondary | ICD-10-CM | POA: Diagnosis not present

## 2017-02-22 DIAGNOSIS — Z7984 Long term (current) use of oral hypoglycemic drugs: Secondary | ICD-10-CM | POA: Diagnosis not present

## 2017-02-22 DIAGNOSIS — E1122 Type 2 diabetes mellitus with diabetic chronic kidney disease: Secondary | ICD-10-CM | POA: Diagnosis not present

## 2017-02-22 DIAGNOSIS — N390 Urinary tract infection, site not specified: Secondary | ICD-10-CM | POA: Diagnosis not present

## 2017-02-22 DIAGNOSIS — I5032 Chronic diastolic (congestive) heart failure: Secondary | ICD-10-CM | POA: Diagnosis not present

## 2017-02-22 DIAGNOSIS — N183 Chronic kidney disease, stage 3 (moderate): Secondary | ICD-10-CM | POA: Diagnosis not present

## 2017-02-22 DIAGNOSIS — I130 Hypertensive heart and chronic kidney disease with heart failure and stage 1 through stage 4 chronic kidney disease, or unspecified chronic kidney disease: Secondary | ICD-10-CM | POA: Diagnosis not present

## 2017-02-26 DIAGNOSIS — I130 Hypertensive heart and chronic kidney disease with heart failure and stage 1 through stage 4 chronic kidney disease, or unspecified chronic kidney disease: Secondary | ICD-10-CM | POA: Diagnosis not present

## 2017-02-26 DIAGNOSIS — Z7984 Long term (current) use of oral hypoglycemic drugs: Secondary | ICD-10-CM | POA: Diagnosis not present

## 2017-02-26 DIAGNOSIS — I5032 Chronic diastolic (congestive) heart failure: Secondary | ICD-10-CM | POA: Diagnosis not present

## 2017-02-26 DIAGNOSIS — Z7982 Long term (current) use of aspirin: Secondary | ICD-10-CM | POA: Diagnosis not present

## 2017-02-26 DIAGNOSIS — E1122 Type 2 diabetes mellitus with diabetic chronic kidney disease: Secondary | ICD-10-CM | POA: Diagnosis not present

## 2017-02-26 DIAGNOSIS — N183 Chronic kidney disease, stage 3 (moderate): Secondary | ICD-10-CM | POA: Diagnosis not present

## 2017-02-26 DIAGNOSIS — N390 Urinary tract infection, site not specified: Secondary | ICD-10-CM | POA: Diagnosis not present

## 2017-02-26 DIAGNOSIS — Z6832 Body mass index (BMI) 32.0-32.9, adult: Secondary | ICD-10-CM | POA: Diagnosis not present

## 2017-02-28 DIAGNOSIS — N183 Chronic kidney disease, stage 3 (moderate): Secondary | ICD-10-CM | POA: Diagnosis not present

## 2017-02-28 DIAGNOSIS — I130 Hypertensive heart and chronic kidney disease with heart failure and stage 1 through stage 4 chronic kidney disease, or unspecified chronic kidney disease: Secondary | ICD-10-CM | POA: Diagnosis not present

## 2017-02-28 DIAGNOSIS — I5032 Chronic diastolic (congestive) heart failure: Secondary | ICD-10-CM | POA: Diagnosis not present

## 2017-02-28 DIAGNOSIS — Z6832 Body mass index (BMI) 32.0-32.9, adult: Secondary | ICD-10-CM | POA: Diagnosis not present

## 2017-02-28 DIAGNOSIS — N390 Urinary tract infection, site not specified: Secondary | ICD-10-CM | POA: Diagnosis not present

## 2017-02-28 DIAGNOSIS — Z7982 Long term (current) use of aspirin: Secondary | ICD-10-CM | POA: Diagnosis not present

## 2017-02-28 DIAGNOSIS — Z7984 Long term (current) use of oral hypoglycemic drugs: Secondary | ICD-10-CM | POA: Diagnosis not present

## 2017-02-28 DIAGNOSIS — E1122 Type 2 diabetes mellitus with diabetic chronic kidney disease: Secondary | ICD-10-CM | POA: Diagnosis not present

## 2017-03-01 ENCOUNTER — Other Ambulatory Visit: Payer: Medicare HMO

## 2017-03-01 DIAGNOSIS — I5032 Chronic diastolic (congestive) heart failure: Secondary | ICD-10-CM | POA: Diagnosis not present

## 2017-03-01 DIAGNOSIS — Z7984 Long term (current) use of oral hypoglycemic drugs: Secondary | ICD-10-CM | POA: Diagnosis not present

## 2017-03-01 DIAGNOSIS — N390 Urinary tract infection, site not specified: Secondary | ICD-10-CM | POA: Diagnosis not present

## 2017-03-01 DIAGNOSIS — I130 Hypertensive heart and chronic kidney disease with heart failure and stage 1 through stage 4 chronic kidney disease, or unspecified chronic kidney disease: Secondary | ICD-10-CM | POA: Diagnosis not present

## 2017-03-01 DIAGNOSIS — E1122 Type 2 diabetes mellitus with diabetic chronic kidney disease: Secondary | ICD-10-CM | POA: Diagnosis not present

## 2017-03-01 DIAGNOSIS — R300 Dysuria: Secondary | ICD-10-CM | POA: Diagnosis not present

## 2017-03-01 DIAGNOSIS — Z6832 Body mass index (BMI) 32.0-32.9, adult: Secondary | ICD-10-CM | POA: Diagnosis not present

## 2017-03-01 DIAGNOSIS — Z7982 Long term (current) use of aspirin: Secondary | ICD-10-CM | POA: Diagnosis not present

## 2017-03-01 DIAGNOSIS — E119 Type 2 diabetes mellitus without complications: Secondary | ICD-10-CM | POA: Diagnosis not present

## 2017-03-01 DIAGNOSIS — N183 Chronic kidney disease, stage 3 (moderate): Secondary | ICD-10-CM | POA: Diagnosis not present

## 2017-03-01 LAB — URINALYSIS, COMPLETE
BILIRUBIN UA: NEGATIVE
KETONES UA: NEGATIVE
Nitrite, UA: NEGATIVE
SPEC GRAV UA: 1.02 (ref 1.005–1.030)
UUROB: 0.2 mg/dL (ref 0.2–1.0)
pH, UA: 6 (ref 5.0–7.5)

## 2017-03-01 LAB — MICROSCOPIC EXAMINATION: WBC, UA: >30 /hpf — AB (ref 0–?)

## 2017-03-02 ENCOUNTER — Other Ambulatory Visit: Payer: Self-pay | Admitting: Family Medicine

## 2017-03-02 DIAGNOSIS — N183 Chronic kidney disease, stage 3 (moderate): Secondary | ICD-10-CM | POA: Diagnosis not present

## 2017-03-02 DIAGNOSIS — Z7984 Long term (current) use of oral hypoglycemic drugs: Secondary | ICD-10-CM | POA: Diagnosis not present

## 2017-03-02 DIAGNOSIS — Z7982 Long term (current) use of aspirin: Secondary | ICD-10-CM | POA: Diagnosis not present

## 2017-03-02 DIAGNOSIS — Z6832 Body mass index (BMI) 32.0-32.9, adult: Secondary | ICD-10-CM | POA: Diagnosis not present

## 2017-03-02 DIAGNOSIS — N390 Urinary tract infection, site not specified: Secondary | ICD-10-CM | POA: Diagnosis not present

## 2017-03-02 DIAGNOSIS — I5032 Chronic diastolic (congestive) heart failure: Secondary | ICD-10-CM | POA: Diagnosis not present

## 2017-03-02 DIAGNOSIS — E1122 Type 2 diabetes mellitus with diabetic chronic kidney disease: Secondary | ICD-10-CM | POA: Diagnosis not present

## 2017-03-02 DIAGNOSIS — I130 Hypertensive heart and chronic kidney disease with heart failure and stage 1 through stage 4 chronic kidney disease, or unspecified chronic kidney disease: Secondary | ICD-10-CM | POA: Diagnosis not present

## 2017-03-02 NOTE — Telephone Encounter (Signed)
Please have patient schedule an appt with Dr Wendi Snipes for hospital follow up/ Medication management.  It appears he cancelled the one scheduled in September.

## 2017-03-02 NOTE — Telephone Encounter (Signed)
Seen 07/24/16   Dr Wendi Snipes PCP

## 2017-03-02 NOTE — Telephone Encounter (Signed)
30 day Refill for Toprol sent to pharmacy.  Short supply of potassium sent in setting of recent hypokalemia.  Patient has renal disease.  Potassium supplementation should be administered with caution.

## 2017-03-04 LAB — URINE CULTURE

## 2017-03-05 ENCOUNTER — Telehealth: Payer: Self-pay | Admitting: Family Medicine

## 2017-03-05 ENCOUNTER — Other Ambulatory Visit: Payer: Self-pay | Admitting: Family Medicine

## 2017-03-05 DIAGNOSIS — Z7982 Long term (current) use of aspirin: Secondary | ICD-10-CM | POA: Diagnosis not present

## 2017-03-05 DIAGNOSIS — N390 Urinary tract infection, site not specified: Secondary | ICD-10-CM | POA: Diagnosis not present

## 2017-03-05 DIAGNOSIS — N183 Chronic kidney disease, stage 3 (moderate): Secondary | ICD-10-CM | POA: Diagnosis not present

## 2017-03-05 DIAGNOSIS — I5032 Chronic diastolic (congestive) heart failure: Secondary | ICD-10-CM | POA: Diagnosis not present

## 2017-03-05 DIAGNOSIS — E1122 Type 2 diabetes mellitus with diabetic chronic kidney disease: Secondary | ICD-10-CM | POA: Diagnosis not present

## 2017-03-05 DIAGNOSIS — Z6832 Body mass index (BMI) 32.0-32.9, adult: Secondary | ICD-10-CM | POA: Diagnosis not present

## 2017-03-05 DIAGNOSIS — Z7984 Long term (current) use of oral hypoglycemic drugs: Secondary | ICD-10-CM | POA: Diagnosis not present

## 2017-03-05 DIAGNOSIS — I130 Hypertensive heart and chronic kidney disease with heart failure and stage 1 through stage 4 chronic kidney disease, or unspecified chronic kidney disease: Secondary | ICD-10-CM | POA: Diagnosis not present

## 2017-03-05 MED ORDER — LEVOFLOXACIN 750 MG PO TABS: 750.0000 mg | ORAL_TABLET | Freq: Every day | ORAL | 0 refills | Status: DC

## 2017-03-05 MED ORDER — CIPROFLOXACIN HCL 500 MG PO TABS: 500.0000 mg | ORAL_TABLET | Freq: Two times a day (BID) | ORAL | 0 refills | Status: DC

## 2017-03-05 NOTE — Telephone Encounter (Signed)
RX canceled and sent to correct pharmacy. Aware.

## 2017-03-05 NOTE — Addendum Note (Signed)
Addended by: Antonietta Barcelona D on: 03/05/2017 03:19 PM   Modules accepted: Orders

## 2017-03-05 NOTE — Telephone Encounter (Signed)
Please advise 

## 2017-03-05 NOTE — Telephone Encounter (Signed)
Change cipro ( to be used to cover pseudomonas uti) to levaquin. This would cover pneumonia as well.   Needs to be evaluated.   Laroy Apple, MD [facility] 03/05/2017, 3:00 PM

## 2017-03-06 DIAGNOSIS — E1122 Type 2 diabetes mellitus with diabetic chronic kidney disease: Secondary | ICD-10-CM | POA: Diagnosis not present

## 2017-03-06 DIAGNOSIS — N183 Chronic kidney disease, stage 3 (moderate): Secondary | ICD-10-CM | POA: Diagnosis not present

## 2017-03-06 DIAGNOSIS — Z6832 Body mass index (BMI) 32.0-32.9, adult: Secondary | ICD-10-CM | POA: Diagnosis not present

## 2017-03-06 DIAGNOSIS — I5032 Chronic diastolic (congestive) heart failure: Secondary | ICD-10-CM | POA: Diagnosis not present

## 2017-03-06 DIAGNOSIS — I130 Hypertensive heart and chronic kidney disease with heart failure and stage 1 through stage 4 chronic kidney disease, or unspecified chronic kidney disease: Secondary | ICD-10-CM | POA: Diagnosis not present

## 2017-03-06 DIAGNOSIS — Z7984 Long term (current) use of oral hypoglycemic drugs: Secondary | ICD-10-CM | POA: Diagnosis not present

## 2017-03-06 DIAGNOSIS — Z7982 Long term (current) use of aspirin: Secondary | ICD-10-CM | POA: Diagnosis not present

## 2017-03-06 DIAGNOSIS — N390 Urinary tract infection, site not specified: Secondary | ICD-10-CM | POA: Diagnosis not present

## 2017-03-07 DIAGNOSIS — I130 Hypertensive heart and chronic kidney disease with heart failure and stage 1 through stage 4 chronic kidney disease, or unspecified chronic kidney disease: Secondary | ICD-10-CM | POA: Diagnosis not present

## 2017-03-07 DIAGNOSIS — Z7982 Long term (current) use of aspirin: Secondary | ICD-10-CM | POA: Diagnosis not present

## 2017-03-07 DIAGNOSIS — Z6832 Body mass index (BMI) 32.0-32.9, adult: Secondary | ICD-10-CM | POA: Diagnosis not present

## 2017-03-07 DIAGNOSIS — E1122 Type 2 diabetes mellitus with diabetic chronic kidney disease: Secondary | ICD-10-CM | POA: Diagnosis not present

## 2017-03-07 DIAGNOSIS — N390 Urinary tract infection, site not specified: Secondary | ICD-10-CM | POA: Diagnosis not present

## 2017-03-07 DIAGNOSIS — N183 Chronic kidney disease, stage 3 (moderate): Secondary | ICD-10-CM | POA: Diagnosis not present

## 2017-03-07 DIAGNOSIS — Z7984 Long term (current) use of oral hypoglycemic drugs: Secondary | ICD-10-CM | POA: Diagnosis not present

## 2017-03-07 DIAGNOSIS — I5032 Chronic diastolic (congestive) heart failure: Secondary | ICD-10-CM | POA: Diagnosis not present

## 2017-03-10 ENCOUNTER — Other Ambulatory Visit: Payer: Self-pay | Admitting: Family Medicine

## 2017-03-10 DIAGNOSIS — I130 Hypertensive heart and chronic kidney disease with heart failure and stage 1 through stage 4 chronic kidney disease, or unspecified chronic kidney disease: Secondary | ICD-10-CM | POA: Diagnosis not present

## 2017-03-10 DIAGNOSIS — N183 Chronic kidney disease, stage 3 (moderate): Secondary | ICD-10-CM | POA: Diagnosis not present

## 2017-03-10 DIAGNOSIS — E1122 Type 2 diabetes mellitus with diabetic chronic kidney disease: Secondary | ICD-10-CM | POA: Diagnosis not present

## 2017-03-10 DIAGNOSIS — N390 Urinary tract infection, site not specified: Secondary | ICD-10-CM | POA: Diagnosis not present

## 2017-03-10 DIAGNOSIS — I5032 Chronic diastolic (congestive) heart failure: Secondary | ICD-10-CM | POA: Diagnosis not present

## 2017-03-10 DIAGNOSIS — R188 Other ascites: Secondary | ICD-10-CM

## 2017-03-10 DIAGNOSIS — Z7982 Long term (current) use of aspirin: Secondary | ICD-10-CM | POA: Diagnosis not present

## 2017-03-10 DIAGNOSIS — Z7984 Long term (current) use of oral hypoglycemic drugs: Secondary | ICD-10-CM | POA: Diagnosis not present

## 2017-03-10 DIAGNOSIS — Z6832 Body mass index (BMI) 32.0-32.9, adult: Secondary | ICD-10-CM | POA: Diagnosis not present

## 2017-03-12 DIAGNOSIS — N183 Chronic kidney disease, stage 3 (moderate): Secondary | ICD-10-CM | POA: Diagnosis not present

## 2017-03-12 DIAGNOSIS — Z6832 Body mass index (BMI) 32.0-32.9, adult: Secondary | ICD-10-CM | POA: Diagnosis not present

## 2017-03-12 DIAGNOSIS — Z7984 Long term (current) use of oral hypoglycemic drugs: Secondary | ICD-10-CM | POA: Diagnosis not present

## 2017-03-12 DIAGNOSIS — I5032 Chronic diastolic (congestive) heart failure: Secondary | ICD-10-CM | POA: Diagnosis not present

## 2017-03-12 DIAGNOSIS — E1122 Type 2 diabetes mellitus with diabetic chronic kidney disease: Secondary | ICD-10-CM | POA: Diagnosis not present

## 2017-03-12 DIAGNOSIS — Z7982 Long term (current) use of aspirin: Secondary | ICD-10-CM | POA: Diagnosis not present

## 2017-03-12 DIAGNOSIS — N390 Urinary tract infection, site not specified: Secondary | ICD-10-CM | POA: Diagnosis not present

## 2017-03-12 DIAGNOSIS — I130 Hypertensive heart and chronic kidney disease with heart failure and stage 1 through stage 4 chronic kidney disease, or unspecified chronic kidney disease: Secondary | ICD-10-CM | POA: Diagnosis not present

## 2017-03-14 DIAGNOSIS — I130 Hypertensive heart and chronic kidney disease with heart failure and stage 1 through stage 4 chronic kidney disease, or unspecified chronic kidney disease: Secondary | ICD-10-CM | POA: Diagnosis not present

## 2017-03-14 DIAGNOSIS — Z7984 Long term (current) use of oral hypoglycemic drugs: Secondary | ICD-10-CM | POA: Diagnosis not present

## 2017-03-14 DIAGNOSIS — N390 Urinary tract infection, site not specified: Secondary | ICD-10-CM | POA: Diagnosis not present

## 2017-03-14 DIAGNOSIS — N183 Chronic kidney disease, stage 3 (moderate): Secondary | ICD-10-CM | POA: Diagnosis not present

## 2017-03-14 DIAGNOSIS — I5032 Chronic diastolic (congestive) heart failure: Secondary | ICD-10-CM | POA: Diagnosis not present

## 2017-03-14 DIAGNOSIS — E1122 Type 2 diabetes mellitus with diabetic chronic kidney disease: Secondary | ICD-10-CM | POA: Diagnosis not present

## 2017-03-14 DIAGNOSIS — Z7982 Long term (current) use of aspirin: Secondary | ICD-10-CM | POA: Diagnosis not present

## 2017-03-14 DIAGNOSIS — Z6832 Body mass index (BMI) 32.0-32.9, adult: Secondary | ICD-10-CM | POA: Diagnosis not present

## 2017-03-15 DIAGNOSIS — E1122 Type 2 diabetes mellitus with diabetic chronic kidney disease: Secondary | ICD-10-CM | POA: Diagnosis not present

## 2017-03-15 DIAGNOSIS — Z7982 Long term (current) use of aspirin: Secondary | ICD-10-CM | POA: Diagnosis not present

## 2017-03-15 DIAGNOSIS — Z7984 Long term (current) use of oral hypoglycemic drugs: Secondary | ICD-10-CM | POA: Diagnosis not present

## 2017-03-15 DIAGNOSIS — N390 Urinary tract infection, site not specified: Secondary | ICD-10-CM | POA: Diagnosis not present

## 2017-03-15 DIAGNOSIS — Z6832 Body mass index (BMI) 32.0-32.9, adult: Secondary | ICD-10-CM | POA: Diagnosis not present

## 2017-03-15 DIAGNOSIS — I130 Hypertensive heart and chronic kidney disease with heart failure and stage 1 through stage 4 chronic kidney disease, or unspecified chronic kidney disease: Secondary | ICD-10-CM | POA: Diagnosis not present

## 2017-03-15 DIAGNOSIS — N183 Chronic kidney disease, stage 3 (moderate): Secondary | ICD-10-CM | POA: Diagnosis not present

## 2017-03-15 DIAGNOSIS — I5032 Chronic diastolic (congestive) heart failure: Secondary | ICD-10-CM | POA: Diagnosis not present

## 2017-03-23 ENCOUNTER — Ambulatory Visit: Payer: Medicare HMO | Admitting: Family Medicine

## 2017-03-24 ENCOUNTER — Ambulatory Visit (INDEPENDENT_AMBULATORY_CARE_PROVIDER_SITE_OTHER): Payer: Medicare HMO | Admitting: Family

## 2017-03-24 ENCOUNTER — Encounter: Payer: Self-pay | Admitting: Family

## 2017-03-24 VITALS — BP 137/84 | HR 82 | Temp 98.4°F | Ht 65.0 in

## 2017-03-24 DIAGNOSIS — J209 Acute bronchitis, unspecified: Secondary | ICD-10-CM | POA: Diagnosis not present

## 2017-03-24 DIAGNOSIS — R05 Cough: Secondary | ICD-10-CM

## 2017-03-24 DIAGNOSIS — R059 Cough, unspecified: Secondary | ICD-10-CM

## 2017-03-24 NOTE — Progress Notes (Signed)
   Subjective:    Patient ID: [name], male    DOB: Mar 08, 1925, 83 y.o.   MRN: [id number]  Pt presents to the office today with cough. Pt has a UTI and pneumonia and was started on Levaquin on 03/05/17. Pt has completed this. His daughter states she noticed he was coughing this morning with "creamy sputum".  Cough  This is a new problem. The current episode started yesterday. The problem has been gradually worsening. The problem occurs every few minutes. The cough is productive of sputum (creamy). Associated symptoms include nasal congestion and shortness of breath. Pertinent negatives include no chills, ear pain, fever, headaches, postnasal drip, rhinorrhea, sore throat or wheezing. The symptoms are aggravated by lying down. He has tried rest for the symptoms. The treatment provided mild relief. His past medical history is significant for pneumonia.      Review of Systems  Constitutional: Negative for chills and fever.  HENT: Negative for ear pain, postnasal drip, rhinorrhea and sore throat.   Respiratory: Positive for cough and shortness of breath. Negative for wheezing.   Neurological: Negative for headaches.  All other systems reviewed and are negative.      Objective:   Physical Exam  Constitutional: He is oriented to person, place, and time. He appears well-developed and well-nourished. No distress.  HENT:  Head: Normocephalic.  HOH  Eyes: Pupils are equal, round, and reactive to light. Right eye exhibits no discharge. Left eye exhibits no discharge.  Neck: Normal range of motion. Neck supple. No thyromegaly present.  Cardiovascular: Normal rate, regular rhythm, normal heart sounds and intact distal pulses.   No murmur heard. Pulmonary/Chest: Effort normal and breath sounds normal. No respiratory distress. He has no wheezes.  crackles in base of left lung  Musculoskeletal: He exhibits no edema or tenderness.  Generalized weakness, using rolling walker  Neurological: He  is alert and oriented to person, place, and time.  Psychiatric: He has a normal mood and affect. His behavior is normal. Judgment and thought content normal.  Vitals reviewed.    BP 137/84   Pulse 82   Temp 98.4 F (36.9 C) (Oral)   Ht 5\' 5"  (1.651 m)      Assessment & Plan:  1. Cough  2. Acute bronchitis, unspecified organism  Lung sound good at this time Pt completed Levaquin one week ago and has follow up appt on 10/31, if symptoms continue will need chest x-ray then. Mucinex  Rest Force fluids Continue lasix  Call if symptoms get worse or not improve  Evelina Dun, FNP

## 2017-03-24 NOTE — Patient Instructions (Signed)

## 2017-03-27 ENCOUNTER — Telehealth: Payer: Self-pay | Admitting: Family Medicine

## 2017-03-27 ENCOUNTER — Telehealth: Payer: Self-pay | Admitting: *Deleted

## 2017-03-27 DIAGNOSIS — E1122 Type 2 diabetes mellitus with diabetic chronic kidney disease: Secondary | ICD-10-CM | POA: Diagnosis not present

## 2017-03-27 DIAGNOSIS — R05 Cough: Secondary | ICD-10-CM | POA: Diagnosis not present

## 2017-03-27 DIAGNOSIS — Z6832 Body mass index (BMI) 32.0-32.9, adult: Secondary | ICD-10-CM | POA: Diagnosis not present

## 2017-03-27 DIAGNOSIS — N390 Urinary tract infection, site not specified: Secondary | ICD-10-CM | POA: Diagnosis not present

## 2017-03-27 DIAGNOSIS — N183 Chronic kidney disease, stage 3 (moderate): Secondary | ICD-10-CM | POA: Diagnosis not present

## 2017-03-27 DIAGNOSIS — I5032 Chronic diastolic (congestive) heart failure: Secondary | ICD-10-CM | POA: Diagnosis not present

## 2017-03-27 DIAGNOSIS — Z7982 Long term (current) use of aspirin: Secondary | ICD-10-CM | POA: Diagnosis not present

## 2017-03-27 DIAGNOSIS — Z7984 Long term (current) use of oral hypoglycemic drugs: Secondary | ICD-10-CM | POA: Diagnosis not present

## 2017-03-27 DIAGNOSIS — I130 Hypertensive heart and chronic kidney disease with heart failure and stage 1 through stage 4 chronic kidney disease, or unspecified chronic kidney disease: Secondary | ICD-10-CM | POA: Diagnosis not present

## 2017-03-27 NOTE — Telephone Encounter (Signed)
Pt seen by Evelina Dun on Saturday. Pt has developed wheezing LLL, no fever, no wanting to get up out of recliner, per verbal ok to get portable chest xray. Will call us back if needed as well as if patient needs to keep / cancel tomorrows appointment

## 2017-03-27 NOTE — Telephone Encounter (Signed)
Spoke with daughter, home health nurse had portable xray come out today, should have results by tomorrow for his appointment.  Also will need labs before next refills.

## 2017-03-28 ENCOUNTER — Ambulatory Visit (INDEPENDENT_AMBULATORY_CARE_PROVIDER_SITE_OTHER): Payer: Medicare HMO | Admitting: Family Medicine

## 2017-03-28 ENCOUNTER — Encounter: Payer: Self-pay | Admitting: Family Medicine

## 2017-03-28 VITALS — BP 133/72 | HR 80 | Temp 97.4°F

## 2017-03-28 DIAGNOSIS — I10 Essential (primary) hypertension: Secondary | ICD-10-CM | POA: Diagnosis not present

## 2017-03-28 DIAGNOSIS — R05 Cough: Secondary | ICD-10-CM | POA: Diagnosis not present

## 2017-03-28 DIAGNOSIS — E119 Type 2 diabetes mellitus without complications: Secondary | ICD-10-CM

## 2017-03-28 DIAGNOSIS — R059 Cough, unspecified: Secondary | ICD-10-CM

## 2017-03-28 DIAGNOSIS — R069 Unspecified abnormalities of breathing: Secondary | ICD-10-CM | POA: Diagnosis not present

## 2017-03-28 LAB — VERITOR FLU A/B WAIVED
INFLUENZA A: NEGATIVE
INFLUENZA B: NEGATIVE

## 2017-03-28 LAB — BAYER DCA HB A1C WAIVED: HB A1C: 9.1 % — AB (ref <7.0)

## 2017-03-28 MED ORDER — AZITHROMYCIN 250 MG PO TABS: ORAL_TABLET | ORAL | 0 refills | Status: DC

## 2017-03-28 NOTE — Patient Instructions (Signed)
Great to meet you!  Come back in 1 month for routine follow up

## 2017-03-28 NOTE — Progress Notes (Signed)
   HPI  Patient presents today here with acute illness.  Patient has had productive cough of thick yellow sputum for 4-5 days.  He has been taking Mucinex with mild improvement. He had a portable chest x-ray last night which was clear He has normal food and fluid intake, no chest pain. He has mild shortness of breath and body aches as well.  Patient was recently hospitalized for UTI, he had follow-up course of Levaquin which has been completed. He seems to be recovered from that perspective.  Diabetes-was previously diet-controlled, and medications currently, blood sugar elevated to the low 200s this morning  PMH: Smoking status noted ROS: Per HPI  Objective: BP 133/72 (BP Location: Right Wrist, Patient Position: Sitting, Cuff Size: Normal)   Pulse 80   Temp (!) 97.4 F (36.3 C) (Oral)  Gen: NAD, alert, cooperative with exam HEENT: NCAT CV: RRR, good S1/S2, no murmur Resp: Nonlabored, expiratory rhonchi in bilateral bases Abd: SNTND, BS present, no guarding or organomegaly Ext: No edema, warm Neuro: Alert and oriented, No gross deficits  Assessment and plan:  #Cough Clinically pneumonia, chest x-ray portable was clear last night. Treating with azithromycin  #Type 2 diabetes A1c pending, did not discuss further treatment today as this was mentioned at the very end of the visit. If uncontrolled will likely need to start medication  #Hypertension Well-controlled No changes Repeat labs after hospitalization    Orders Placed This Encounter  Procedures  . CMP14+EGFR  . CBC with Differential/Platelet  . Veritor Flu A/B Waived    Order Specific Question:   Source    Answer:   nasal  . Bayer DCA Hb A1c Waived    Meds ordered this encounter  Medications  . azithromycin (ZITHROMAX) 250 MG tablet    Sig: Take 2 tablets on day 1 and 1 tablet daily after that    Dispense:  6 tablet    Refill:  0    Laroy Apple, MD [facility] 03/28/2017,  5:04 PM

## 2017-03-29 ENCOUNTER — Other Ambulatory Visit: Payer: Self-pay | Admitting: Family Medicine

## 2017-03-29 DIAGNOSIS — E1142 Type 2 diabetes mellitus with diabetic polyneuropathy: Secondary | ICD-10-CM | POA: Diagnosis not present

## 2017-03-29 DIAGNOSIS — L84 Corns and callosities: Secondary | ICD-10-CM | POA: Diagnosis not present

## 2017-03-29 DIAGNOSIS — B351 Tinea unguium: Secondary | ICD-10-CM | POA: Diagnosis not present

## 2017-03-29 DIAGNOSIS — M79676 Pain in unspecified toe(s): Secondary | ICD-10-CM | POA: Diagnosis not present

## 2017-03-29 LAB — CBC WITH DIFFERENTIAL/PLATELET
BASOS ABS: 0.1 10*3/uL (ref 0.0–0.2)
Basos: 1 %
EOS (ABSOLUTE): 0.1 10*3/uL (ref 0.0–0.4)
EOS: 1 %
HEMOGLOBIN: 13.3 g/dL (ref 13.0–17.7)
Hematocrit: 41.1 % (ref 37.5–51.0)
IMMATURE GRANS (ABS): 0 10*3/uL (ref 0.0–0.1)
IMMATURE GRANULOCYTES: 0 %
LYMPHS: 26 %
Lymphocytes Absolute: 2.3 10*3/uL (ref 0.7–3.1)
MCH: 28.9 pg (ref 26.6–33.0)
MCHC: 32.4 g/dL (ref 31.5–35.7)
MCV: 89 fL (ref 79–97)
Monocytes Absolute: 0.9 10*3/uL (ref 0.1–0.9)
Monocytes: 9 %
NEUTROS PCT: 63 %
Neutrophils Absolute: 5.7 10*3/uL (ref 1.4–7.0)
PLATELETS: 218 10*3/uL (ref 150–379)
RBC: 4.6 x10E6/uL (ref 4.14–5.80)
RDW: 14.4 % (ref 12.3–15.4)
WBC: 9.1 10*3/uL (ref 3.4–10.8)

## 2017-03-29 LAB — CMP14+EGFR
A/G RATIO: 1.1 — AB (ref 1.2–2.2)
ALT: 20 IU/L (ref 0–44)
AST: 19 IU/L (ref 0–40)
Albumin: 3.5 g/dL (ref 3.5–4.7)
Alkaline Phosphatase: 95 IU/L (ref 39–117)
BILIRUBIN TOTAL: 0.4 mg/dL (ref 0.0–1.2)
BUN/Creatinine Ratio: 10 (ref 10–24)
BUN: 13 mg/dL (ref 8–27)
CHLORIDE: 94 mmol/L — AB (ref 96–106)
CO2: 25 mmol/L (ref 20–29)
Calcium: 8.3 mg/dL — ABNORMAL LOW (ref 8.6–10.2)
Creatinine, Ser: 1.32 mg/dL — ABNORMAL HIGH (ref 0.76–1.27)
GFR calc Af Amer: 55 mL/min/{1.73_m2} — ABNORMAL LOW (ref >59)
GFR calc non Af Amer: 47 mL/min/{1.73_m2} — ABNORMAL LOW (ref >59)
GLOBULIN, TOTAL: 3.1 g/dL (ref 1.5–4.5)
Glucose: 222 mg/dL — ABNORMAL HIGH (ref 65–99)
POTASSIUM: 3.3 mmol/L — AB (ref 3.5–5.2)
SODIUM: 136 mmol/L (ref 134–144)
Total Protein: 6.6 g/dL (ref 6.0–8.5)

## 2017-03-30 ENCOUNTER — Other Ambulatory Visit: Payer: Self-pay

## 2017-03-30 DIAGNOSIS — E119 Type 2 diabetes mellitus without complications: Secondary | ICD-10-CM | POA: Diagnosis not present

## 2017-03-30 MED ORDER — METOPROLOL SUCCINATE ER 25 MG PO TB24: ORAL_TABLET | ORAL | 4 refills | Status: DC

## 2017-03-30 MED ORDER — POTASSIUM CHLORIDE CRYS ER 10 MEQ PO TBCR: EXTENDED_RELEASE_TABLET | ORAL | 5 refills | Status: DC

## 2017-03-30 NOTE — Telephone Encounter (Signed)
lmtcb

## 2017-04-11 ENCOUNTER — Other Ambulatory Visit: Payer: Self-pay | Admitting: Family Medicine

## 2017-04-11 DIAGNOSIS — R188 Other ascites: Secondary | ICD-10-CM

## 2017-04-17 NOTE — Telephone Encounter (Signed)
Multiple attempts made to contact patient.  This encounter will now be closed  

## 2017-04-18 ENCOUNTER — Ambulatory Visit: Payer: Medicare HMO | Admitting: *Deleted

## 2017-04-18 DIAGNOSIS — E1122 Type 2 diabetes mellitus with diabetic chronic kidney disease: Secondary | ICD-10-CM | POA: Diagnosis not present

## 2017-04-18 DIAGNOSIS — N390 Urinary tract infection, site not specified: Secondary | ICD-10-CM | POA: Diagnosis not present

## 2017-04-18 DIAGNOSIS — I5032 Chronic diastolic (congestive) heart failure: Secondary | ICD-10-CM | POA: Diagnosis not present

## 2017-04-18 DIAGNOSIS — Z7982 Long term (current) use of aspirin: Secondary | ICD-10-CM | POA: Diagnosis not present

## 2017-04-18 DIAGNOSIS — Z6832 Body mass index (BMI) 32.0-32.9, adult: Secondary | ICD-10-CM | POA: Diagnosis not present

## 2017-04-18 DIAGNOSIS — Z7984 Long term (current) use of oral hypoglycemic drugs: Secondary | ICD-10-CM | POA: Diagnosis not present

## 2017-04-18 DIAGNOSIS — I130 Hypertensive heart and chronic kidney disease with heart failure and stage 1 through stage 4 chronic kidney disease, or unspecified chronic kidney disease: Secondary | ICD-10-CM | POA: Diagnosis not present

## 2017-04-18 DIAGNOSIS — N183 Chronic kidney disease, stage 3 (moderate): Secondary | ICD-10-CM | POA: Diagnosis not present

## 2017-04-23 ENCOUNTER — Ambulatory Visit (INDEPENDENT_AMBULATORY_CARE_PROVIDER_SITE_OTHER): Payer: Medicare HMO | Admitting: Family Medicine

## 2017-04-23 ENCOUNTER — Encounter: Payer: Self-pay | Admitting: Family Medicine

## 2017-04-23 VITALS — BP 141/71 | HR 67 | Temp 97.8°F | Ht 65.0 in | Wt 203.4 lb

## 2017-04-23 DIAGNOSIS — L989 Disorder of the skin and subcutaneous tissue, unspecified: Secondary | ICD-10-CM | POA: Diagnosis not present

## 2017-04-23 DIAGNOSIS — E119 Type 2 diabetes mellitus without complications: Secondary | ICD-10-CM

## 2017-04-23 MED ORDER — ACCU-CHEK AVIVA DEVI: 0 refills | Status: AC

## 2017-04-23 MED ORDER — GLUCOSE BLOOD VI STRP: 1.0000 | ORAL_STRIP | Freq: Every day | 3 refills | Status: DC

## 2017-04-23 MED ORDER — ACCU-CHEK SOFTCLIX LANCET DEV MISC: 1.0000 | Freq: Every day | 3 refills | Status: DC

## 2017-04-23 NOTE — Patient Instructions (Signed)
Great to see you!   

## 2017-04-23 NOTE — Addendum Note (Signed)
Addended by: Timmothy Euler on: 04/23/2017 12:02 PM   Modules accepted: Orders

## 2017-04-23 NOTE — Progress Notes (Signed)
   HPI  Patient presents today with a rash.  His daughter is present with him today and states that he started a cream from podiatry for athlete's foot last week.  It seems like this has gotten worse since then.  She denies any specific lesions or drainage of the feet. He still eating and drinking normally.  He is acting like himself.  He wears socks to bed.  He wears socks or shoes 24 hours a day.\  PMH: Smoking status noted ROS: Per HPI  Objective: BP (!) 141/71   Pulse 67   Temp 97.8 F (36.6 C) (Oral)   Ht '5\' 5"'$  (1.651 m)   Wt 203 lb 6.4 oz (92.3 kg)   BMI 33.85 kg/m  Gen: NAD, alert, cooperative with exam HEENT: NCAT CV: RRR, good S1/S2, no murmur Resp: CTABL, no wheezes, non-labored Ext: No edema, warm Neuro: Alert and oriented, No gross deficits Skin:  Bilateral feet with scattered areas of tiny erythematous papules that are coalescing in some areas including the left great toe and the left heel  Similar lesion on the left shin, however patient's daughter states that he has had these long-term.   Assessment and plan:  #Skin lesion Petechiae versus tinea pedis Change to Lamisil otc, ketoconazole cream was the only new addition recently. Monitor lesion Labs including CBC and sed rate, to evaluate for possible vasculitis Patient has follow-up in about 2 weeks, recheck at that time    Orders Placed This Encounter  Procedures  . CBC with Differential/Platelet  . CMP14+EGFR  . Sedimentation Rate     Laroy Apple, MD [facility] 04/23/2017, 11:49 AM

## 2017-04-24 LAB — CBC WITH DIFFERENTIAL/PLATELET
BASOS ABS: 0.1 10*3/uL (ref 0.0–0.2)
Basos: 1 %
EOS (ABSOLUTE): 0.2 10*3/uL (ref 0.0–0.4)
Eos: 2 %
Hematocrit: 41.7 % (ref 37.5–51.0)
Hemoglobin: 13.2 g/dL (ref 13.0–17.7)
Immature Grans (Abs): 0 10*3/uL (ref 0.0–0.1)
Immature Granulocytes: 0 %
LYMPHS ABS: 3 10*3/uL (ref 0.7–3.1)
Lymphs: 38 %
MCH: 28.1 pg (ref 26.6–33.0)
MCHC: 31.7 g/dL (ref 31.5–35.7)
MCV: 89 fL (ref 79–97)
Monocytes Absolute: 0.6 10*3/uL (ref 0.1–0.9)
Monocytes: 8 %
NEUTROS ABS: 4 10*3/uL (ref 1.4–7.0)
Neutrophils: 51 %
PLATELETS: 195 10*3/uL (ref 150–379)
RBC: 4.69 x10E6/uL (ref 4.14–5.80)
RDW: 14.3 % (ref 12.3–15.4)
WBC: 7.9 10*3/uL (ref 3.4–10.8)

## 2017-04-24 LAB — SEDIMENTATION RATE: Sed Rate: 15 mm/hr (ref 0–30)

## 2017-04-24 LAB — CMP14+EGFR
ALT: 20 IU/L (ref 0–44)
AST: 17 IU/L (ref 0–40)
Albumin/Globulin Ratio: 1.4 (ref 1.2–2.2)
Albumin: 3.7 g/dL (ref 3.5–4.7)
Alkaline Phosphatase: 81 IU/L (ref 39–117)
BUN/Creatinine Ratio: 13 (ref 10–24)
BUN: 18 mg/dL (ref 8–27)
Bilirubin Total: 0.3 mg/dL (ref 0.0–1.2)
CO2: 26 mmol/L (ref 20–29)
CREATININE: 1.43 mg/dL — AB (ref 0.76–1.27)
Calcium: 9.1 mg/dL (ref 8.6–10.2)
Chloride: 95 mmol/L — ABNORMAL LOW (ref 96–106)
GFR, EST AFRICAN AMERICAN: 50 mL/min/{1.73_m2} — AB (ref >59)
GFR, EST NON AFRICAN AMERICAN: 43 mL/min/{1.73_m2} — AB (ref >59)
GLUCOSE: 296 mg/dL — AB (ref 65–99)
Globulin, Total: 2.7 g/dL (ref 1.5–4.5)
POTASSIUM: 3.8 mmol/L (ref 3.5–5.2)
SODIUM: 137 mmol/L (ref 134–144)
Total Protein: 6.4 g/dL (ref 6.0–8.5)

## 2017-04-26 DIAGNOSIS — B353 Tinea pedis: Secondary | ICD-10-CM | POA: Diagnosis not present

## 2017-04-26 DIAGNOSIS — M79675 Pain in left toe(s): Secondary | ICD-10-CM | POA: Diagnosis not present

## 2017-04-30 ENCOUNTER — Ambulatory Visit (INDEPENDENT_AMBULATORY_CARE_PROVIDER_SITE_OTHER): Payer: Medicare HMO | Admitting: Family Medicine

## 2017-04-30 ENCOUNTER — Encounter: Payer: Self-pay | Admitting: Family Medicine

## 2017-04-30 VITALS — BP 136/76 | HR 63 | Temp 97.4°F | Ht 65.0 in | Wt 200.0 lb

## 2017-04-30 DIAGNOSIS — R21 Rash and other nonspecific skin eruption: Secondary | ICD-10-CM

## 2017-04-30 DIAGNOSIS — E119 Type 2 diabetes mellitus without complications: Secondary | ICD-10-CM

## 2017-04-30 MED ORDER — GLIMEPIRIDE 1 MG PO TABS: 1.0000 mg | ORAL_TABLET | Freq: Every day | ORAL | 3 refills | Status: DC

## 2017-04-30 NOTE — Progress Notes (Signed)
   HPI  Patient presents today follow-up for foot rash and diabetes.  Foot rash seems to be improving on Lotrisone, patient has followed up with podiatry again and was changed to [place].  Daughter states that podiatry says if he does not improve they would recommend dermatology referral.  Diabetes Average fasting blood sugar is 170-180. No hypoglycemia with Januvia. Does not watch his diet.   PMH: Smoking status noted ROS: Per HPI  Objective: BP 136/76   Pulse 63   Temp (!) 97.4 F (36.3 C) (Oral)   Ht 5\' 5"  (1.651 m)   Wt 200 lb (90.7 kg)   BMI 33.28 kg/m  Gen: NAD, alert, cooperative with exam HEENT: NCAT CV: RRR, good S1/S2, no murmur Resp: CTABL, no wheezes, non-labored Ext: No edema, warm Neuro: Alert and oriented, No gross deficits  Skin Left foot examined with fewer lesions but coalescing similar lesions to last visit which are darker in color than previous.  Overall appear to be improved  Assessment and plan:  #Type 2 diabetes Uncontrolled, discussed A1c goal less than 8 Continue Januvia, considering his renal function I would not titrate dose. Adding low-dose glimepiride Discussed GLP as alternative with once a week injection  #Rash Tinea pedis versus vasculitis versus petechiae Seem to be improving and coalescing, continue Lotrisone per podiatry Low threshold for follow-up if symptoms worsen or other concerns arise     Meds ordered this encounter  Medications  . glimepiride (AMARYL) 1 MG tablet    Sig: Take 1 tablet (1 mg total) by mouth daily with breakfast.    Dispense:  30 tablet    Refill:  3    Laroy Apple, MD [facility] 04/30/2017, 3:54 PM

## 2017-05-01 ENCOUNTER — Ambulatory Visit: Payer: Medicare HMO | Admitting: *Deleted

## 2017-05-03 ENCOUNTER — Telehealth: Payer: Self-pay | Admitting: Family Medicine

## 2017-05-03 NOTE — Telephone Encounter (Signed)
Gust with his daughter, she states that he has had 2 episodes of large bloody bowel movements in the last 24 hours. He has a decreased appetite and seems to be not quite acting like himself but overall does not seem to be feeling very bad. No other symptoms no abdominal pain, no other complaints.  I recommended that he be seen at urgent care at least, in the emergency room if he begins having more frequent bloody bowel movements or his not feeling well.  Laroy Apple, MD [facility] 05/03/2017, 1:54 PM

## 2017-05-09 ENCOUNTER — Other Ambulatory Visit: Payer: Self-pay | Admitting: Family Medicine

## 2017-05-11 ENCOUNTER — Other Ambulatory Visit: Payer: Self-pay | Admitting: Family Medicine

## 2017-05-11 DIAGNOSIS — R188 Other ascites: Secondary | ICD-10-CM

## 2017-05-15 ENCOUNTER — Ambulatory Visit: Payer: Medicare HMO | Admitting: *Deleted

## 2017-05-17 ENCOUNTER — Ambulatory Visit (INDEPENDENT_AMBULATORY_CARE_PROVIDER_SITE_OTHER): Payer: Medicare HMO

## 2017-05-17 DIAGNOSIS — I5032 Chronic diastolic (congestive) heart failure: Secondary | ICD-10-CM | POA: Diagnosis not present

## 2017-05-17 DIAGNOSIS — I130 Hypertensive heart and chronic kidney disease with heart failure and stage 1 through stage 4 chronic kidney disease, or unspecified chronic kidney disease: Secondary | ICD-10-CM | POA: Diagnosis not present

## 2017-05-17 DIAGNOSIS — N183 Chronic kidney disease, stage 3 (moderate): Secondary | ICD-10-CM

## 2017-05-17 DIAGNOSIS — E1122 Type 2 diabetes mellitus with diabetic chronic kidney disease: Secondary | ICD-10-CM

## 2017-05-21 ENCOUNTER — Other Ambulatory Visit: Payer: Self-pay | Admitting: Family Medicine

## 2017-06-02 IMAGING — DX DG CHEST 2V
2 series · 2 of 2 positions shown · non-contrast
Comparison: 12/28/2015

CLINICAL DATA: Cough, fever

EXAM:
CHEST  2 VIEW

[chest pa]
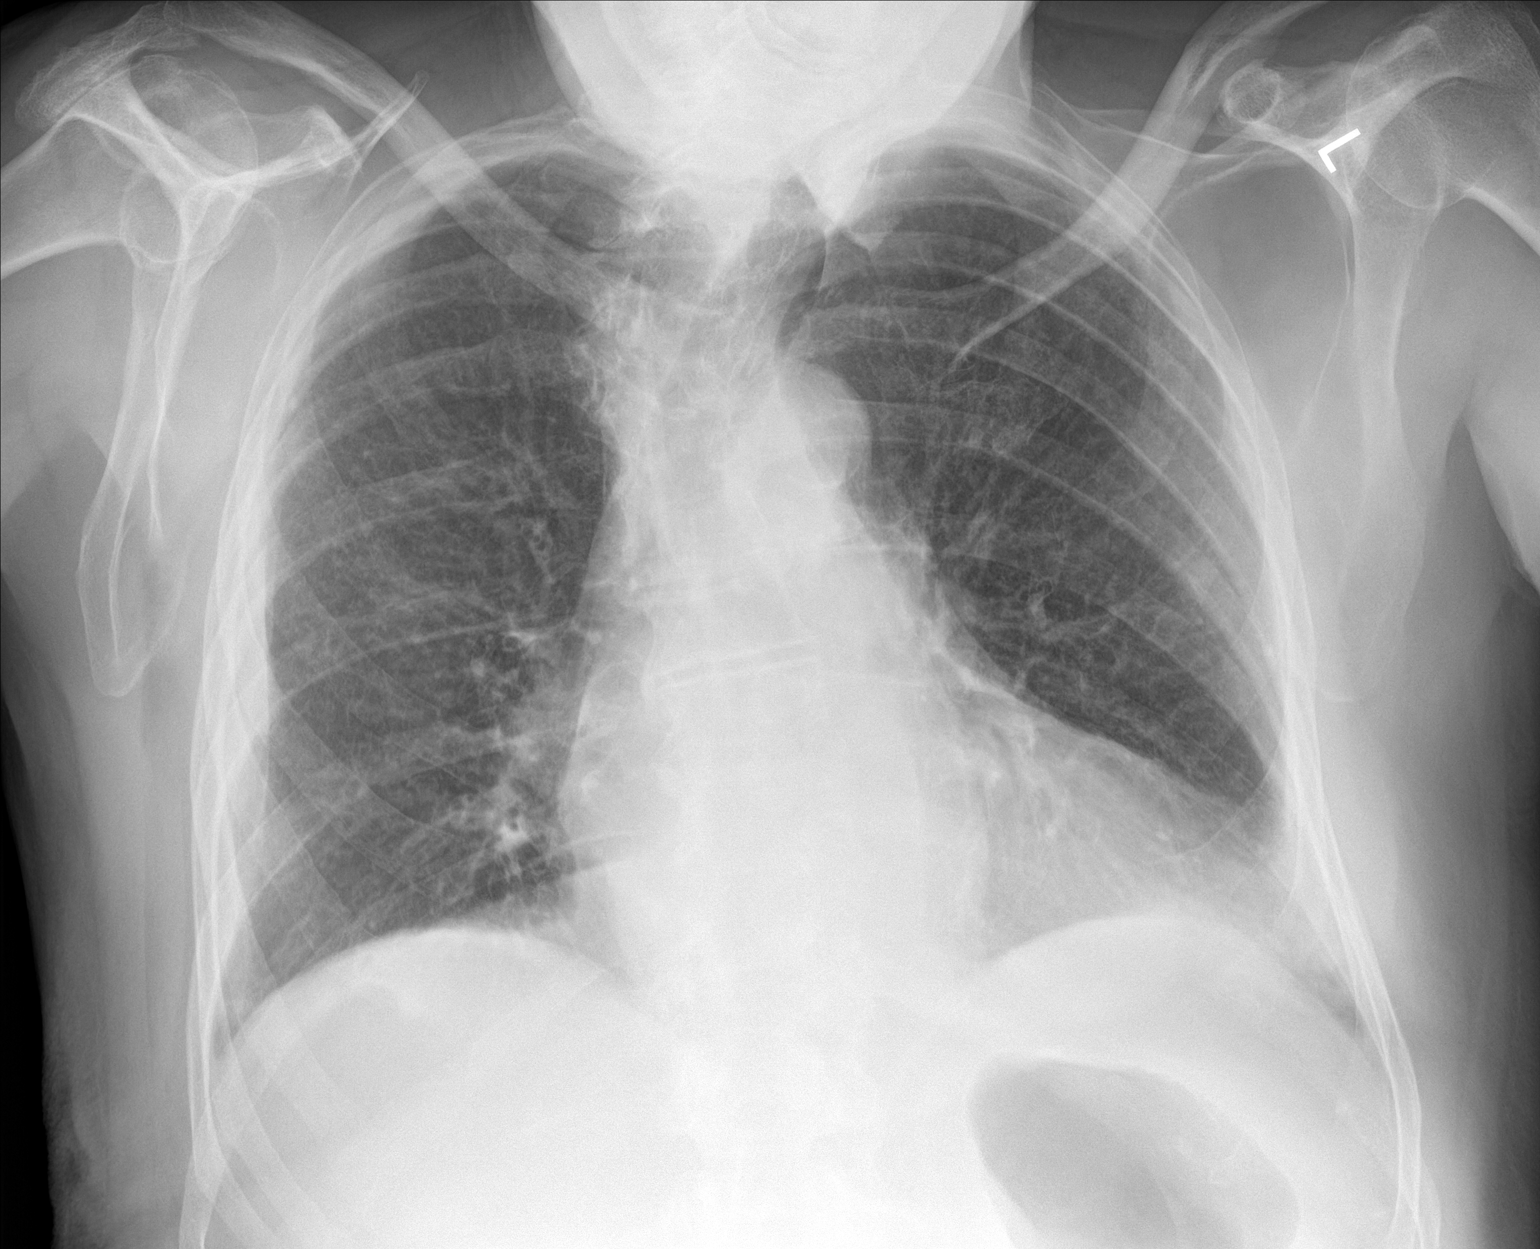

[chest lat]
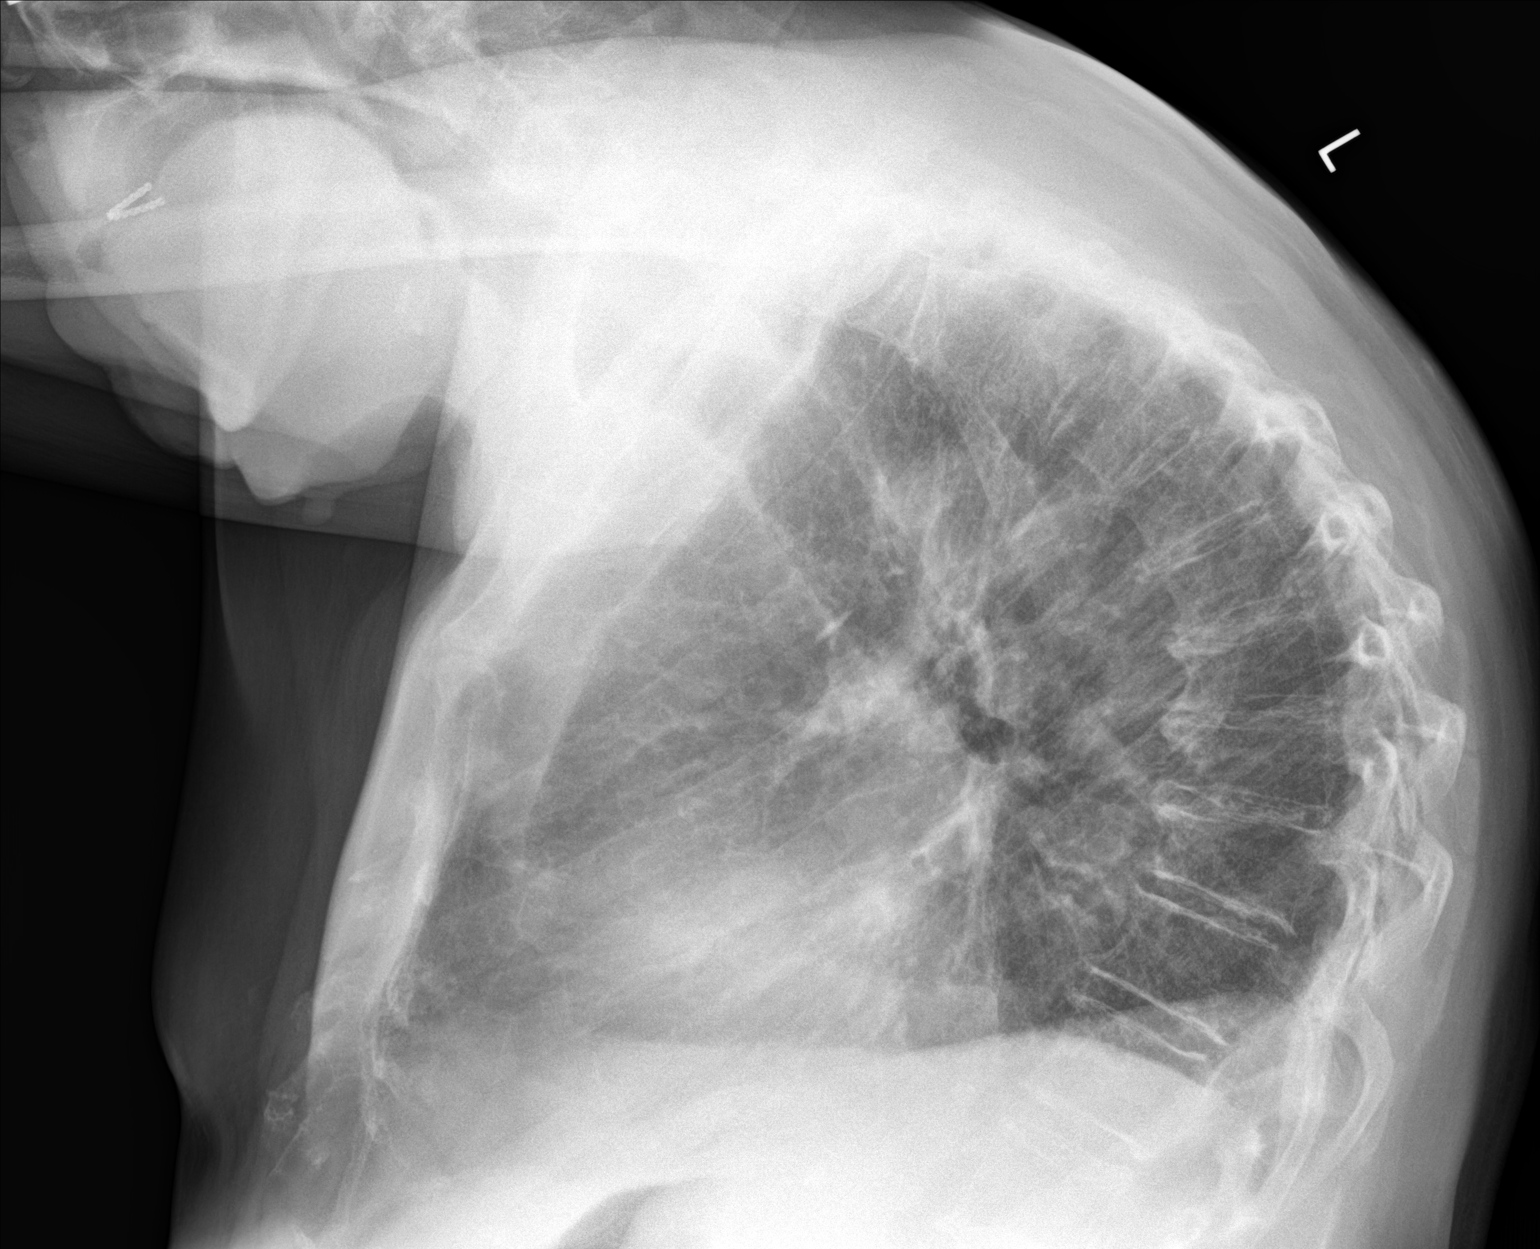

[2 of 2 positions shown; findings below may reference images not displayed]

FINDINGS: Cardiomediastinal silhouette is stable. Upper thoracic
dextroscoliosis again noted. Stable degenerative changes mid and
lower thoracic spine. Stable scarring left lung base. Mild kyphosis
mid thoracic spine again noted. No infiltrate or pulmonary edema.
Stable chronic mild bronchitic changes
IMPRESSION: No active cardiopulmonary disease. Stable chronic mild bronchitic
changes. Stable degenerative changes and dextroscoliosis upper
thoracic spine. Again noted left basilar scarring.

## 2017-06-04 ENCOUNTER — Other Ambulatory Visit: Payer: Self-pay | Admitting: Family Medicine

## 2017-06-05 NOTE — Telephone Encounter (Signed)
Last thyroid 10/27/15

## 2017-06-14 ENCOUNTER — Other Ambulatory Visit: Payer: Self-pay | Admitting: Family Medicine

## 2017-06-19 DIAGNOSIS — E1122 Type 2 diabetes mellitus with diabetic chronic kidney disease: Secondary | ICD-10-CM | POA: Diagnosis not present

## 2017-06-19 DIAGNOSIS — I5032 Chronic diastolic (congestive) heart failure: Secondary | ICD-10-CM | POA: Diagnosis not present

## 2017-06-19 DIAGNOSIS — I130 Hypertensive heart and chronic kidney disease with heart failure and stage 1 through stage 4 chronic kidney disease, or unspecified chronic kidney disease: Secondary | ICD-10-CM | POA: Diagnosis not present

## 2017-06-19 DIAGNOSIS — Z7984 Long term (current) use of oral hypoglycemic drugs: Secondary | ICD-10-CM | POA: Diagnosis not present

## 2017-06-19 DIAGNOSIS — Z7982 Long term (current) use of aspirin: Secondary | ICD-10-CM | POA: Diagnosis not present

## 2017-06-19 DIAGNOSIS — Z87440 Personal history of urinary (tract) infections: Secondary | ICD-10-CM | POA: Diagnosis not present

## 2017-06-19 DIAGNOSIS — N183 Chronic kidney disease, stage 3 (moderate): Secondary | ICD-10-CM | POA: Diagnosis not present

## 2017-06-19 DIAGNOSIS — Z6832 Body mass index (BMI) 32.0-32.9, adult: Secondary | ICD-10-CM | POA: Diagnosis not present

## 2017-06-21 DIAGNOSIS — B351 Tinea unguium: Secondary | ICD-10-CM | POA: Diagnosis not present

## 2017-06-21 DIAGNOSIS — M79675 Pain in left toe(s): Secondary | ICD-10-CM | POA: Diagnosis not present

## 2017-06-28 DIAGNOSIS — H02135 Senile ectropion of left lower eyelid: Secondary | ICD-10-CM | POA: Diagnosis not present

## 2017-06-28 DIAGNOSIS — H02132 Senile ectropion of right lower eyelid: Secondary | ICD-10-CM | POA: Diagnosis not present

## 2017-06-28 DIAGNOSIS — D492 Neoplasm of unspecified behavior of bone, soft tissue, and skin: Secondary | ICD-10-CM | POA: Diagnosis not present

## 2017-07-04 ENCOUNTER — Other Ambulatory Visit: Payer: Self-pay | Admitting: Family Medicine

## 2017-07-16 DIAGNOSIS — H02132 Senile ectropion of right lower eyelid: Secondary | ICD-10-CM | POA: Diagnosis not present

## 2017-07-16 DIAGNOSIS — L720 Epidermal cyst: Secondary | ICD-10-CM | POA: Diagnosis not present

## 2017-07-16 DIAGNOSIS — H5789 Other specified disorders of eye and adnexa: Secondary | ICD-10-CM | POA: Diagnosis not present

## 2017-07-16 DIAGNOSIS — H02135 Senile ectropion of left lower eyelid: Secondary | ICD-10-CM | POA: Diagnosis not present

## 2017-07-16 DIAGNOSIS — D481 Neoplasm of uncertain behavior of connective and other soft tissue: Secondary | ICD-10-CM | POA: Diagnosis not present

## 2017-07-30 ENCOUNTER — Encounter: Payer: Self-pay | Admitting: Family Medicine

## 2017-07-30 ENCOUNTER — Ambulatory Visit (INDEPENDENT_AMBULATORY_CARE_PROVIDER_SITE_OTHER): Payer: Medicare HMO | Admitting: Family Medicine

## 2017-07-30 VITALS — BP 136/79 | HR 81 | Temp 97.5°F | Ht 65.0 in | Wt 202.6 lb

## 2017-07-30 DIAGNOSIS — E039 Hypothyroidism, unspecified: Secondary | ICD-10-CM | POA: Diagnosis not present

## 2017-07-30 DIAGNOSIS — I10 Essential (primary) hypertension: Secondary | ICD-10-CM | POA: Diagnosis not present

## 2017-07-30 DIAGNOSIS — E119 Type 2 diabetes mellitus without complications: Secondary | ICD-10-CM | POA: Diagnosis not present

## 2017-07-30 LAB — BAYER DCA HB A1C WAIVED: HB A1C: 6.6 % (ref <7.0)

## 2017-07-30 NOTE — Progress Notes (Signed)
   HPI  Patient presents today for follow-up chronic medical conditions.  Patient was started on Januvia and then glimepiride for type 2 diabetes. His diet is being watched by his daughter. Average fasting blood sugars around 150-200. No hypoglycemia.  Hypothyroidism Doing well, needs labs today.  Hypertension Good medication compliance  PMH: Smoking status noted ROS: Per HPI  Objective: BP 136/79   Pulse 81   Temp (!) 97.5 F (36.4 C) (Oral)   Ht '5\' 5"'$  (1.651 m)   Wt 202 lb 9.6 oz (91.9 kg)   BMI 33.71 kg/m  Gen: NAD, alert, cooperative with exam HEENT: NCAT, EOMI, PERRL CV: RRR, good S1/S2, no murmur Resp: CTABL, no wheezes, non-labored Ext: No edema, warm Neuro: Alert and oriented, No gross deficits  Assessment and plan:  #Type 2 diabetes. Very well controlled with A1c of 6.1, has improved from 9.1. Discontinue glimepiride Continue Januvia Labs  #Hypothyroidism Asymptomatic Checking TSH Continue Synthroid  #Hypertension Well-controlled No changes    Orders Placed This Encounter  Procedures  . Bayer DCA Hb A1c Waived  . Microalbumin / creatinine urine ratio  . TSH  . [place], MD [place] 07/30/2017, 3:23 PM

## 2017-07-30 NOTE — Patient Instructions (Signed)
Great to see you!  Stop amaryl, continue Januvia.

## 2017-07-31 LAB — CMP14+EGFR
A/G RATIO: 1.3 (ref 1.2–2.2)
ALK PHOS: 90 IU/L (ref 39–117)
ALT: 20 IU/L (ref 0–44)
AST: 19 IU/L (ref 0–40)
Albumin: 3.9 g/dL (ref 3.2–4.6)
BUN/Creatinine Ratio: 11 (ref 10–24)
BUN: 16 mg/dL (ref 10–36)
CHLORIDE: 98 mmol/L (ref 96–106)
CO2: 28 mmol/L (ref 20–29)
Calcium: 8.9 mg/dL (ref 8.6–10.2)
Creatinine, Ser: 1.43 mg/dL — ABNORMAL HIGH (ref 0.76–1.27)
GFR calc non Af Amer: 43 mL/min/{1.73_m2} — ABNORMAL LOW (ref >59)
GFR, EST AFRICAN AMERICAN: 49 mL/min/{1.73_m2} — AB (ref >59)
Globulin, Total: 2.9 g/dL (ref 1.5–4.5)
Glucose: 134 mg/dL — ABNORMAL HIGH (ref 65–99)
POTASSIUM: 3.6 mmol/L (ref 3.5–5.2)
Sodium: 141 mmol/L (ref 134–144)
Total Protein: 6.8 g/dL (ref 6.0–8.5)

## 2017-07-31 LAB — TSH: TSH: 3.79 u[IU]/mL (ref 0.450–4.500)

## 2017-08-13 ENCOUNTER — Other Ambulatory Visit: Payer: Self-pay | Admitting: Family Medicine

## 2017-08-13 DIAGNOSIS — R188 Other ascites: Secondary | ICD-10-CM

## 2017-08-16 DIAGNOSIS — Z7982 Long term (current) use of aspirin: Secondary | ICD-10-CM | POA: Diagnosis not present

## 2017-08-16 DIAGNOSIS — N183 Chronic kidney disease, stage 3 (moderate): Secondary | ICD-10-CM | POA: Diagnosis not present

## 2017-08-16 DIAGNOSIS — Z6832 Body mass index (BMI) 32.0-32.9, adult: Secondary | ICD-10-CM | POA: Diagnosis not present

## 2017-08-16 DIAGNOSIS — I5032 Chronic diastolic (congestive) heart failure: Secondary | ICD-10-CM | POA: Diagnosis not present

## 2017-08-16 DIAGNOSIS — I130 Hypertensive heart and chronic kidney disease with heart failure and stage 1 through stage 4 chronic kidney disease, or unspecified chronic kidney disease: Secondary | ICD-10-CM | POA: Diagnosis not present

## 2017-08-16 DIAGNOSIS — Z7984 Long term (current) use of oral hypoglycemic drugs: Secondary | ICD-10-CM | POA: Diagnosis not present

## 2017-08-16 DIAGNOSIS — E1122 Type 2 diabetes mellitus with diabetic chronic kidney disease: Secondary | ICD-10-CM | POA: Diagnosis not present

## 2017-08-16 DIAGNOSIS — Z87440 Personal history of urinary (tract) infections: Secondary | ICD-10-CM | POA: Diagnosis not present

## 2017-08-23 ENCOUNTER — Other Ambulatory Visit: Payer: Self-pay | Admitting: Family Medicine

## 2017-08-30 ENCOUNTER — Ambulatory Visit (INDEPENDENT_AMBULATORY_CARE_PROVIDER_SITE_OTHER): Payer: Medicare HMO

## 2017-08-30 DIAGNOSIS — I5032 Chronic diastolic (congestive) heart failure: Secondary | ICD-10-CM | POA: Diagnosis not present

## 2017-08-30 DIAGNOSIS — I130 Hypertensive heart and chronic kidney disease with heart failure and stage 1 through stage 4 chronic kidney disease, or unspecified chronic kidney disease: Secondary | ICD-10-CM

## 2017-08-30 DIAGNOSIS — N183 Chronic kidney disease, stage 3 (moderate): Secondary | ICD-10-CM

## 2017-08-30 DIAGNOSIS — E1122 Type 2 diabetes mellitus with diabetic chronic kidney disease: Secondary | ICD-10-CM | POA: Diagnosis not present

## 2017-09-10 ENCOUNTER — Other Ambulatory Visit: Payer: Self-pay | Admitting: Family Medicine

## 2017-09-11 NOTE — Telephone Encounter (Signed)
Last seen 07/30/17  Last lipid 07/05/15

## 2017-09-17 ENCOUNTER — Other Ambulatory Visit: Payer: Self-pay | Admitting: Family Medicine

## 2017-09-27 DIAGNOSIS — M79676 Pain in unspecified toe(s): Secondary | ICD-10-CM | POA: Diagnosis not present

## 2017-09-27 DIAGNOSIS — B351 Tinea unguium: Secondary | ICD-10-CM | POA: Diagnosis not present

## 2017-09-27 DIAGNOSIS — L84 Corns and callosities: Secondary | ICD-10-CM | POA: Diagnosis not present

## 2017-09-27 DIAGNOSIS — E1142 Type 2 diabetes mellitus with diabetic polyneuropathy: Secondary | ICD-10-CM | POA: Diagnosis not present

## 2017-11-06 ENCOUNTER — Ambulatory Visit (INDEPENDENT_AMBULATORY_CARE_PROVIDER_SITE_OTHER): Payer: Medicare HMO | Admitting: Family Medicine

## 2017-11-06 ENCOUNTER — Encounter: Payer: Self-pay | Admitting: Family Medicine

## 2017-11-06 VITALS — BP 142/63 | HR 67 | Temp 98.2°F | Ht 65.0 in | Wt 193.6 lb

## 2017-11-06 DIAGNOSIS — R6889 Other general symptoms and signs: Secondary | ICD-10-CM

## 2017-11-06 DIAGNOSIS — E119 Type 2 diabetes mellitus without complications: Secondary | ICD-10-CM | POA: Diagnosis not present

## 2017-11-06 DIAGNOSIS — R448 Other symptoms and signs involving general sensations and perceptions: Secondary | ICD-10-CM

## 2017-11-06 LAB — BAYER DCA HB A1C WAIVED: HB A1C (BAYER DCA - WAIVED): 8.2 % — ABNORMAL HIGH (ref <7.0)

## 2017-11-06 NOTE — Patient Instructions (Signed)
Great to see you!  His A1C is up to 8.2, this is a pretty big change from 6.6 before. We will need to keep a close eye on it.   See Dr. Lajuana Ripple in 3 months

## 2017-11-06 NOTE — Progress Notes (Signed)
   HPI  Patient presents today for follow-up chronic medical conditions.  Patient does not speak much. His daughter is present, she states that he is been complaining of feeling cold when no one else is cold recently.  She requests that his iron is checked.  He is watching his diet moderately. They are checking his blood sugars occasionally.  He has been taking his medication daily.   PMH: Smoking status noted ROS: Per HPI  Objective: BP (!) 142/63   Pulse 67   Temp 98.2 F (36.8 C) (Oral)   Ht '5\' 5"'$  (1.651 m)   Wt 193 lb 9.6 oz (87.8 kg)   BMI 32.22 kg/m  Gen: NAD, alert, cooperative with exam HEENT: NCAT CV: RRR, good S1/S2, no murmur Resp: CTABL, no wheezes, non-labored Ext: No edema, warm Neuro: Alert and oriented, No gross deficits  Assessment and plan:  #Feels cold Patient with subjectively feeling cold when other people do not. Thyroid was normal 3 months ago Checking CBC and ferritin  #Diabetes type 2 Uncontrolled, A1c previously 6.6, now 8.2 Continue Januvia which is renally dosed Discussed diet changes for now, may need to escalate medications   Orders Placed This Encounter  Procedures  . Bayer DCA Hb A1c Waived  . CBC with Differential/Platelet  . Ferritin  . [place], MD [facility] 11/06/2017, 4:13 PM

## 2017-11-09 ENCOUNTER — Other Ambulatory Visit: Payer: Self-pay | Admitting: Family Medicine

## 2017-11-09 DIAGNOSIS — R188 Other ascites: Secondary | ICD-10-CM

## 2017-11-12 ENCOUNTER — Ambulatory Visit (INDEPENDENT_AMBULATORY_CARE_PROVIDER_SITE_OTHER): Payer: Medicare HMO

## 2017-11-12 ENCOUNTER — Encounter: Payer: Self-pay | Admitting: Family Medicine

## 2017-11-12 ENCOUNTER — Ambulatory Visit (INDEPENDENT_AMBULATORY_CARE_PROVIDER_SITE_OTHER): Payer: Medicare HMO | Admitting: Family Medicine

## 2017-11-12 VITALS — BP 123/72 | HR 54 | Temp 97.5°F

## 2017-11-12 DIAGNOSIS — M545 Low back pain, unspecified: Secondary | ICD-10-CM

## 2017-11-12 DIAGNOSIS — S79912A Unspecified injury of left hip, initial encounter: Secondary | ICD-10-CM | POA: Diagnosis not present

## 2017-11-12 NOTE — Patient Instructions (Signed)
Great to see you!  Continue 1-2 tylenol 2-3 times per day, if he needs something stronger or gets worse let me know

## 2017-11-12 NOTE — Progress Notes (Signed)
   HPI  Patient presents today for low back pain after a fall.  His family explains that he fell while he was out in the yard walking with a walker.  They believe that his right leg gave out and that he landed on his left hip.  He complains of left low back pain.  It was very painful this morning and he complained persistently.  They have given him to Tylenol and he seems to be in much less pain.  He was able to walk with a walker this morning.  PMH: Smoking status noted ROS: Per HPI  Objective: BP 123/72 (BP Location: Right Arm, Patient Position: Sitting, Cuff Size: Normal)   Pulse (!) 54   Temp (!) 97.5 F (36.4 C) (Oral)  Gen: NAD, alert, cooperative with exam HEENT: NCAT CV: RRR, good S1/S2, no murmur Resp: CTABL, no wheezes, non-labored Ext: No edema, warm Neuro: Alert and oriented, No gross deficits MSK:  Negative Corky Sox test of the bilateral hips No tenderness to palpation of midline spine or left-sided paraspinal muscles in the low back  Assessment and plan:  #Low back pain After a fall, I will go ahead and do an x-ray of the lumbar spine and left hip Tylenol as needed, reviewed appropriate dosing Tramadol if needed, however he seems to be doing well with only Tylenol currently.     Orders Placed This Encounter  Procedures  . DG Lumbar Spine 2-3 Views    Standing Status:   Future    Standing Expiration Date:   01/12/2019    Order Specific Question:   Reason for Exam (SYMPTOM  OR DIAGNOSIS REQUIRED)    Answer:   l ow back pain after a fall    Order Specific Question:   Preferred imaging location?    Answer:   Internal  . DG HIP UNILAT W OR W/O PELVIS 2-3 VIEWS LEFT    Standing Status:   Future    Standing Expiration Date:   01/11/2019    Order Specific Question:   Reason for Exam (SYMPTOM  OR DIAGNOSIS REQUIRED)    Answer:   l ow back pain after a fall    Order Specific Question:   Preferred imaging location?    Answer:   Internal     Laroy Apple,  MD [facility] 11/12/2017, 1:22 PM

## 2017-11-15 ENCOUNTER — Telehealth: Payer: Self-pay | Admitting: Family Medicine

## 2017-11-15 ENCOUNTER — Encounter: Payer: Self-pay | Admitting: *Deleted

## 2017-11-15 DIAGNOSIS — M545 Low back pain, unspecified: Secondary | ICD-10-CM

## 2017-11-15 MED ORDER — TRAMADOL HCL 50 MG PO TABS: 50.0000 mg | ORAL_TABLET | Freq: Four times a day (QID) | ORAL | 2 refills | Status: DC | PRN

## 2017-11-15 NOTE — Progress Notes (Signed)
Patient's daughter states she spoke with Dr. Wendi Snipes earlier today about patient's legs being weak today.  She states he has had these symptoms before and he had a urinary tract infection at that time.  Per Dr. Wendi Snipes, UA ordered and patient's daughter given a specimen cup to collect specimen and bring back to office.

## 2017-11-15 NOTE — Telephone Encounter (Signed)
Discussed with his daughter, we will try a small amount of tramadol.   Pt with continued back pain, the nightime is the worst.   Laroy Apple, MD [facility] 11/15/2017, 11:59 AM

## 2017-11-16 ENCOUNTER — Other Ambulatory Visit: Payer: Self-pay | Admitting: Family Medicine

## 2017-11-26 ENCOUNTER — Encounter: Payer: Self-pay | Admitting: Family Medicine

## 2017-11-26 ENCOUNTER — Ambulatory Visit (INDEPENDENT_AMBULATORY_CARE_PROVIDER_SITE_OTHER): Payer: Medicare HMO | Admitting: Family Medicine

## 2017-11-26 VITALS — BP 106/60 | HR 80 | Temp 98.5°F

## 2017-11-26 DIAGNOSIS — Z9181 History of falling: Secondary | ICD-10-CM

## 2017-11-26 DIAGNOSIS — M545 Low back pain, unspecified: Secondary | ICD-10-CM

## 2017-11-26 DIAGNOSIS — R29898 Other symptoms and signs involving the musculoskeletal system: Secondary | ICD-10-CM

## 2017-11-26 NOTE — Progress Notes (Signed)
HPI  Patient presents today here with continued back pain and continued weakness.  Patient fell 2 weeks ago while walking in the yard.  Since that time he has had continued bilateral low back pain described as hip pain.  Is worse with walking and worse with laying down at night.  They are interested in home health.  Patient has not had any additional falls.  He is now wheelchair-bound as he almost fell again this morning while family was trying to get him to stand.  No signs of illness.  No cough, shortness of breath, fevers, chills. Patient is using Tylenol and occasional tramadol for pain.  PMH: Smoking status noted ROS: Per HPI  Objective: BP 106/60   Pulse 80   Temp 98.5 F (36.9 C) (Oral)  Gen: NAD, alert, cooperative with exam HEENT: NCAT, EOMI, PERRL CV: RRR, good S1/S2, no murmur Resp: Nonlabored, good air movement, soft crackles bilateral bases Ext: No edema, warm Neuro: Alert and oriented, No gross deficits  Assessment and plan:  #Acute bilateral low back pain without sciatica Home health, refer to orthopedics considering his rapid decline suddenly and function. Plain film last visit was reassuring  #Weakness of bilateral lower extremities Objective testing does not reveal significant weakness, however he has had a rapid decline in function Refer to orthopedics [place]  #History of recent fall-physical therapy home health    Orders Placed This Encounter  Procedures  . CMP14+EGFR  . CBC with Differential/Platelet  . TSH  . Ambulatory referral to Orthopedic Surgery    Referral Priority:   Routine    Referral Type:   Surgical    Referral Reason:   Specialty Services Required    Requested Specialty:   Orthopedic Surgery    Number of Visits Requested:   1  . Home Health    Order Specific Question:   To provide the following care/treatments    Answer:   PT    Order Specific Question:   To provide the following care/treatments    Answer:   RN   . Face-to-face encounter (required for Medicare/Medicaid patients)    I Kenn File certify that this patient is under my care and that I, or a nurse practitioner or physician's assistant working with me, had a face-to-face encounter that meets the physician face-to-face encounter requirements with this patient on 11/26/2017. The encounter with the patient was in whole, or in part for the following medical condition(s) which is the primary reason for home health care (List medical condition): Difficulty walking, recent fall, leg weakness    Order Specific Question:   The encounter with the patient was in whole, or in part, for the following medical condition, which is the primary reason for home health care    Answer:   Difficulty walking, recent fall, leg weakness    Order Specific Question:   I certify that, based on my findings, the following services are medically necessary home health services    Answer:   Physical therapy    Order Specific Question:   I certify that, based on my findings, the following services are medically necessary home health services    Answer:   Nursing    Order Specific Question:   Reason for Medically Necessary Home Health Services    Answer:   Skilled Nursing- Change/Decline in Patient Status    Order Specific Question:   Reason for Medically Necessary Home Health Services    Answer:   Therapy- Therapeutic Exercises to  Increase Strength and Endurance    Order Specific Question:   My clinical findings support the need for the above services    Answer:   Unable to leave home safely without assistance and/or assistive device    Order Specific Question:   Further, I certify that my clinical findings support that this patient is homebound due to:    Answer:   Unable to leave home safely without assistance     Laroy Apple, MD [facility] 11/26/2017, 1:40 PM

## 2017-11-26 NOTE — Patient Instructions (Signed)
Great to see you!  We will work on home health and orthopedic referrals.

## 2017-11-27 LAB — CMP14+EGFR
A/G RATIO: 1.2 (ref 1.2–2.2)
ALBUMIN: 3.7 g/dL (ref 3.2–4.6)
ALT: 17 IU/L (ref 0–44)
AST: 14 IU/L (ref 0–40)
Alkaline Phosphatase: 118 IU/L — ABNORMAL HIGH (ref 39–117)
BUN / CREAT RATIO: 13 (ref 10–24)
BUN: 18 mg/dL (ref 10–36)
Bilirubin Total: 0.3 mg/dL (ref 0.0–1.2)
CALCIUM: 9.2 mg/dL (ref 8.6–10.2)
CO2: 27 mmol/L (ref 20–29)
CREATININE: 1.37 mg/dL — AB (ref 0.76–1.27)
Chloride: 97 mmol/L (ref 96–106)
GFR calc Af Amer: 52 mL/min/{1.73_m2} — ABNORMAL LOW (ref >59)
GFR, EST NON AFRICAN AMERICAN: 45 mL/min/{1.73_m2} — AB (ref >59)
Globulin, Total: 3 g/dL (ref 1.5–4.5)
Glucose: 252 mg/dL — ABNORMAL HIGH (ref 65–99)
POTASSIUM: 3.5 mmol/L (ref 3.5–5.2)
Sodium: 138 mmol/L (ref 134–144)
Total Protein: 6.7 g/dL (ref 6.0–8.5)

## 2017-11-27 LAB — CBC WITH DIFFERENTIAL/PLATELET
BASOS ABS: 0.1 10*3/uL (ref 0.0–0.2)
BASOS: 1 %
EOS (ABSOLUTE): 0.1 10*3/uL (ref 0.0–0.4)
EOS: 2 %
Hematocrit: 40.9 % (ref 37.5–51.0)
Hemoglobin: 13.8 g/dL (ref 13.0–17.7)
IMMATURE GRANS (ABS): 0 10*3/uL (ref 0.0–0.1)
Immature Granulocytes: 0 %
Lymphocytes Absolute: 2.1 10*3/uL (ref 0.7–3.1)
Lymphs: 28 %
MCH: 29.7 pg (ref 26.6–33.0)
MCHC: 33.7 g/dL (ref 31.5–35.7)
MCV: 88 fL (ref 79–97)
MONOS ABS: 0.6 10*3/uL (ref 0.1–0.9)
Monocytes: 8 %
NEUTROS ABS: 4.6 10*3/uL (ref 1.4–7.0)
NEUTROS PCT: 61 %
PLATELETS: 221 10*3/uL (ref 150–450)
RBC: 4.65 x10E6/uL (ref 4.14–5.80)
RDW: 14.6 % (ref 12.3–15.4)
WBC: 7.5 10*3/uL (ref 3.4–10.8)

## 2017-11-27 LAB — TSH: TSH: 2.92 u[IU]/mL (ref 0.450–4.500)

## 2017-11-30 DIAGNOSIS — Z7982 Long term (current) use of aspirin: Secondary | ICD-10-CM | POA: Diagnosis not present

## 2017-11-30 DIAGNOSIS — I10 Essential (primary) hypertension: Secondary | ICD-10-CM | POA: Diagnosis not present

## 2017-11-30 DIAGNOSIS — Z9181 History of falling: Secondary | ICD-10-CM | POA: Diagnosis not present

## 2017-11-30 DIAGNOSIS — M4856XA Collapsed vertebra, not elsewhere classified, lumbar region, initial encounter for fracture: Secondary | ICD-10-CM | POA: Diagnosis not present

## 2017-11-30 DIAGNOSIS — M4856XD Collapsed vertebra, not elsewhere classified, lumbar region, subsequent encounter for fracture with routine healing: Secondary | ICD-10-CM | POA: Diagnosis not present

## 2017-11-30 DIAGNOSIS — M545 Low back pain: Secondary | ICD-10-CM | POA: Diagnosis not present

## 2017-11-30 DIAGNOSIS — E1165 Type 2 diabetes mellitus with hyperglycemia: Secondary | ICD-10-CM | POA: Diagnosis not present

## 2017-11-30 DIAGNOSIS — Z7984 Long term (current) use of oral hypoglycemic drugs: Secondary | ICD-10-CM | POA: Diagnosis not present

## 2017-12-03 ENCOUNTER — Other Ambulatory Visit: Payer: Self-pay | Admitting: Physician Assistant

## 2017-12-03 ENCOUNTER — Telehealth: Payer: Self-pay | Admitting: *Deleted

## 2017-12-03 DIAGNOSIS — Z7984 Long term (current) use of oral hypoglycemic drugs: Secondary | ICD-10-CM | POA: Diagnosis not present

## 2017-12-03 DIAGNOSIS — E1165 Type 2 diabetes mellitus with hyperglycemia: Secondary | ICD-10-CM | POA: Diagnosis not present

## 2017-12-03 DIAGNOSIS — M4856XA Collapsed vertebra, not elsewhere classified, lumbar region, initial encounter for fracture: Secondary | ICD-10-CM

## 2017-12-03 DIAGNOSIS — M4856XD Collapsed vertebra, not elsewhere classified, lumbar region, subsequent encounter for fracture with routine healing: Secondary | ICD-10-CM | POA: Diagnosis not present

## 2017-12-03 DIAGNOSIS — Z7982 Long term (current) use of aspirin: Secondary | ICD-10-CM | POA: Diagnosis not present

## 2017-12-03 DIAGNOSIS — Z9181 History of falling: Secondary | ICD-10-CM | POA: Diagnosis not present

## 2017-12-03 DIAGNOSIS — I10 Essential (primary) hypertension: Secondary | ICD-10-CM | POA: Diagnosis not present

## 2017-12-03 NOTE — Telephone Encounter (Signed)
VM received from [place] [facility] from [facility] He needs an approval that you are aware that there is a level 1 drug interaction between Simvastatin & Amlodipine PT plan is to see pt 1x 1wk, 2x 4wk, 1x 2wk

## 2017-12-04 NOTE — Telephone Encounter (Signed)
Aware of interaction, limit simva to 20 mg.   Laroy Apple, MD [facility] 12/04/2017, 7:44 AM

## 2017-12-06 DIAGNOSIS — M4856XD Collapsed vertebra, not elsewhere classified, lumbar region, subsequent encounter for fracture with routine healing: Secondary | ICD-10-CM | POA: Diagnosis not present

## 2017-12-06 DIAGNOSIS — I10 Essential (primary) hypertension: Secondary | ICD-10-CM | POA: Diagnosis not present

## 2017-12-06 DIAGNOSIS — Z7984 Long term (current) use of oral hypoglycemic drugs: Secondary | ICD-10-CM | POA: Diagnosis not present

## 2017-12-06 DIAGNOSIS — Z7982 Long term (current) use of aspirin: Secondary | ICD-10-CM | POA: Diagnosis not present

## 2017-12-06 DIAGNOSIS — E1165 Type 2 diabetes mellitus with hyperglycemia: Secondary | ICD-10-CM | POA: Diagnosis not present

## 2017-12-06 DIAGNOSIS — Z9181 History of falling: Secondary | ICD-10-CM | POA: Diagnosis not present

## 2017-12-06 NOTE — Telephone Encounter (Signed)
[facility] provider aware of interaction

## 2017-12-07 ENCOUNTER — Ambulatory Visit (HOSPITAL_COMMUNITY)
Admission: RE | Admit: 2017-12-07 | Discharge: 2017-12-07 | Disposition: A | Discharge status: Home / Self Care | Payer: Medicare HMO | Source: Ambulatory Visit | Attending: Physician Assistant | Admitting: Physician Assistant

## 2017-12-07 DIAGNOSIS — X58XXXA Exposure to other specified factors, initial encounter: Secondary | ICD-10-CM | POA: Diagnosis not present

## 2017-12-07 DIAGNOSIS — M4856XA Collapsed vertebra, not elsewhere classified, lumbar region, initial encounter for fracture: Secondary | ICD-10-CM

## 2017-12-07 DIAGNOSIS — M48061 Spinal stenosis, lumbar region without neurogenic claudication: Secondary | ICD-10-CM | POA: Diagnosis not present

## 2017-12-10 DIAGNOSIS — Z7984 Long term (current) use of oral hypoglycemic drugs: Secondary | ICD-10-CM | POA: Diagnosis not present

## 2017-12-10 DIAGNOSIS — Z9181 History of falling: Secondary | ICD-10-CM | POA: Diagnosis not present

## 2017-12-10 DIAGNOSIS — E1165 Type 2 diabetes mellitus with hyperglycemia: Secondary | ICD-10-CM | POA: Diagnosis not present

## 2017-12-10 DIAGNOSIS — I10 Essential (primary) hypertension: Secondary | ICD-10-CM | POA: Diagnosis not present

## 2017-12-10 DIAGNOSIS — Z7982 Long term (current) use of aspirin: Secondary | ICD-10-CM | POA: Diagnosis not present

## 2017-12-10 DIAGNOSIS — M4856XD Collapsed vertebra, not elsewhere classified, lumbar region, subsequent encounter for fracture with routine healing: Secondary | ICD-10-CM | POA: Diagnosis not present

## 2017-12-12 DIAGNOSIS — M4856XD Collapsed vertebra, not elsewhere classified, lumbar region, subsequent encounter for fracture with routine healing: Secondary | ICD-10-CM | POA: Diagnosis not present

## 2017-12-12 DIAGNOSIS — Z7982 Long term (current) use of aspirin: Secondary | ICD-10-CM | POA: Diagnosis not present

## 2017-12-12 DIAGNOSIS — I10 Essential (primary) hypertension: Secondary | ICD-10-CM | POA: Diagnosis not present

## 2017-12-12 DIAGNOSIS — Z9181 History of falling: Secondary | ICD-10-CM | POA: Diagnosis not present

## 2017-12-12 DIAGNOSIS — E1165 Type 2 diabetes mellitus with hyperglycemia: Secondary | ICD-10-CM | POA: Diagnosis not present

## 2017-12-12 DIAGNOSIS — Z7984 Long term (current) use of oral hypoglycemic drugs: Secondary | ICD-10-CM | POA: Diagnosis not present

## 2017-12-13 DIAGNOSIS — I130 Hypertensive heart and chronic kidney disease with heart failure and stage 1 through stage 4 chronic kidney disease, or unspecified chronic kidney disease: Secondary | ICD-10-CM | POA: Diagnosis not present

## 2017-12-17 DIAGNOSIS — Z7982 Long term (current) use of aspirin: Secondary | ICD-10-CM | POA: Diagnosis not present

## 2017-12-17 DIAGNOSIS — Z9181 History of falling: Secondary | ICD-10-CM | POA: Diagnosis not present

## 2017-12-17 DIAGNOSIS — M4856XD Collapsed vertebra, not elsewhere classified, lumbar region, subsequent encounter for fracture with routine healing: Secondary | ICD-10-CM | POA: Diagnosis not present

## 2017-12-17 DIAGNOSIS — E1165 Type 2 diabetes mellitus with hyperglycemia: Secondary | ICD-10-CM | POA: Diagnosis not present

## 2017-12-17 DIAGNOSIS — I10 Essential (primary) hypertension: Secondary | ICD-10-CM | POA: Diagnosis not present

## 2017-12-17 DIAGNOSIS — Z7984 Long term (current) use of oral hypoglycemic drugs: Secondary | ICD-10-CM | POA: Diagnosis not present

## 2017-12-19 DIAGNOSIS — I10 Essential (primary) hypertension: Secondary | ICD-10-CM | POA: Diagnosis not present

## 2017-12-19 DIAGNOSIS — Z7984 Long term (current) use of oral hypoglycemic drugs: Secondary | ICD-10-CM | POA: Diagnosis not present

## 2017-12-19 DIAGNOSIS — M4856XD Collapsed vertebra, not elsewhere classified, lumbar region, subsequent encounter for fracture with routine healing: Secondary | ICD-10-CM | POA: Diagnosis not present

## 2017-12-19 DIAGNOSIS — Z9181 History of falling: Secondary | ICD-10-CM | POA: Diagnosis not present

## 2017-12-19 DIAGNOSIS — Z7982 Long term (current) use of aspirin: Secondary | ICD-10-CM | POA: Diagnosis not present

## 2017-12-19 DIAGNOSIS — E1165 Type 2 diabetes mellitus with hyperglycemia: Secondary | ICD-10-CM | POA: Diagnosis not present

## 2017-12-20 ENCOUNTER — Ambulatory Visit (INDEPENDENT_AMBULATORY_CARE_PROVIDER_SITE_OTHER): Payer: Medicare HMO

## 2017-12-20 DIAGNOSIS — Z7984 Long term (current) use of oral hypoglycemic drugs: Secondary | ICD-10-CM

## 2017-12-20 DIAGNOSIS — E1165 Type 2 diabetes mellitus with hyperglycemia: Secondary | ICD-10-CM

## 2017-12-20 DIAGNOSIS — I10 Essential (primary) hypertension: Secondary | ICD-10-CM

## 2017-12-20 DIAGNOSIS — M4856XD Collapsed vertebra, not elsewhere classified, lumbar region, subsequent encounter for fracture with routine healing: Secondary | ICD-10-CM | POA: Diagnosis not present

## 2017-12-20 DIAGNOSIS — Z9181 History of falling: Secondary | ICD-10-CM | POA: Diagnosis not present

## 2017-12-20 DIAGNOSIS — Z7982 Long term (current) use of aspirin: Secondary | ICD-10-CM

## 2017-12-24 ENCOUNTER — Ambulatory Visit (INDEPENDENT_AMBULATORY_CARE_PROVIDER_SITE_OTHER): Payer: Medicare HMO

## 2017-12-24 DIAGNOSIS — M4856XD Collapsed vertebra, not elsewhere classified, lumbar region, subsequent encounter for fracture with routine healing: Secondary | ICD-10-CM | POA: Diagnosis not present

## 2017-12-24 DIAGNOSIS — I10 Essential (primary) hypertension: Secondary | ICD-10-CM | POA: Diagnosis not present

## 2017-12-24 DIAGNOSIS — Z9181 History of falling: Secondary | ICD-10-CM | POA: Diagnosis not present

## 2017-12-24 DIAGNOSIS — Z7982 Long term (current) use of aspirin: Secondary | ICD-10-CM | POA: Diagnosis not present

## 2017-12-24 DIAGNOSIS — E1165 Type 2 diabetes mellitus with hyperglycemia: Secondary | ICD-10-CM | POA: Diagnosis not present

## 2017-12-24 DIAGNOSIS — Z7984 Long term (current) use of oral hypoglycemic drugs: Secondary | ICD-10-CM | POA: Diagnosis not present

## 2017-12-25 DIAGNOSIS — E1142 Type 2 diabetes mellitus with diabetic polyneuropathy: Secondary | ICD-10-CM | POA: Diagnosis not present

## 2017-12-25 DIAGNOSIS — L84 Corns and callosities: Secondary | ICD-10-CM | POA: Diagnosis not present

## 2017-12-25 DIAGNOSIS — B351 Tinea unguium: Secondary | ICD-10-CM | POA: Diagnosis not present

## 2017-12-25 DIAGNOSIS — M79676 Pain in unspecified toe(s): Secondary | ICD-10-CM | POA: Diagnosis not present

## 2017-12-26 DIAGNOSIS — Z7984 Long term (current) use of oral hypoglycemic drugs: Secondary | ICD-10-CM | POA: Diagnosis not present

## 2017-12-26 DIAGNOSIS — E1165 Type 2 diabetes mellitus with hyperglycemia: Secondary | ICD-10-CM | POA: Diagnosis not present

## 2017-12-26 DIAGNOSIS — Z7982 Long term (current) use of aspirin: Secondary | ICD-10-CM | POA: Diagnosis not present

## 2017-12-26 DIAGNOSIS — Z9181 History of falling: Secondary | ICD-10-CM | POA: Diagnosis not present

## 2017-12-26 DIAGNOSIS — M4856XD Collapsed vertebra, not elsewhere classified, lumbar region, subsequent encounter for fracture with routine healing: Secondary | ICD-10-CM | POA: Diagnosis not present

## 2017-12-26 DIAGNOSIS — I10 Essential (primary) hypertension: Secondary | ICD-10-CM | POA: Diagnosis not present

## 2017-12-31 DIAGNOSIS — Z7982 Long term (current) use of aspirin: Secondary | ICD-10-CM | POA: Diagnosis not present

## 2017-12-31 DIAGNOSIS — Z7984 Long term (current) use of oral hypoglycemic drugs: Secondary | ICD-10-CM | POA: Diagnosis not present

## 2017-12-31 DIAGNOSIS — I10 Essential (primary) hypertension: Secondary | ICD-10-CM | POA: Diagnosis not present

## 2017-12-31 DIAGNOSIS — M4856XD Collapsed vertebra, not elsewhere classified, lumbar region, subsequent encounter for fracture with routine healing: Secondary | ICD-10-CM | POA: Diagnosis not present

## 2017-12-31 DIAGNOSIS — Z9181 History of falling: Secondary | ICD-10-CM | POA: Diagnosis not present

## 2017-12-31 DIAGNOSIS — E1165 Type 2 diabetes mellitus with hyperglycemia: Secondary | ICD-10-CM | POA: Diagnosis not present

## 2018-01-07 DIAGNOSIS — Z7982 Long term (current) use of aspirin: Secondary | ICD-10-CM | POA: Diagnosis not present

## 2018-01-07 DIAGNOSIS — I10 Essential (primary) hypertension: Secondary | ICD-10-CM | POA: Diagnosis not present

## 2018-01-07 DIAGNOSIS — E1165 Type 2 diabetes mellitus with hyperglycemia: Secondary | ICD-10-CM | POA: Diagnosis not present

## 2018-01-07 DIAGNOSIS — Z9181 History of falling: Secondary | ICD-10-CM | POA: Diagnosis not present

## 2018-01-07 DIAGNOSIS — M4856XD Collapsed vertebra, not elsewhere classified, lumbar region, subsequent encounter for fracture with routine healing: Secondary | ICD-10-CM | POA: Diagnosis not present

## 2018-01-07 DIAGNOSIS — Z7984 Long term (current) use of oral hypoglycemic drugs: Secondary | ICD-10-CM | POA: Diagnosis not present

## 2018-01-09 DIAGNOSIS — Z9181 History of falling: Secondary | ICD-10-CM | POA: Diagnosis not present

## 2018-01-09 DIAGNOSIS — M4856XD Collapsed vertebra, not elsewhere classified, lumbar region, subsequent encounter for fracture with routine healing: Secondary | ICD-10-CM | POA: Diagnosis not present

## 2018-01-09 DIAGNOSIS — E1165 Type 2 diabetes mellitus with hyperglycemia: Secondary | ICD-10-CM | POA: Diagnosis not present

## 2018-01-09 DIAGNOSIS — I10 Essential (primary) hypertension: Secondary | ICD-10-CM | POA: Diagnosis not present

## 2018-01-09 DIAGNOSIS — Z7984 Long term (current) use of oral hypoglycemic drugs: Secondary | ICD-10-CM | POA: Diagnosis not present

## 2018-01-09 DIAGNOSIS — Z7982 Long term (current) use of aspirin: Secondary | ICD-10-CM | POA: Diagnosis not present

## 2018-01-14 DIAGNOSIS — Z7984 Long term (current) use of oral hypoglycemic drugs: Secondary | ICD-10-CM | POA: Diagnosis not present

## 2018-01-14 DIAGNOSIS — E1165 Type 2 diabetes mellitus with hyperglycemia: Secondary | ICD-10-CM | POA: Diagnosis not present

## 2018-01-14 DIAGNOSIS — Z9181 History of falling: Secondary | ICD-10-CM | POA: Diagnosis not present

## 2018-01-14 DIAGNOSIS — I10 Essential (primary) hypertension: Secondary | ICD-10-CM | POA: Diagnosis not present

## 2018-01-14 DIAGNOSIS — Z7982 Long term (current) use of aspirin: Secondary | ICD-10-CM | POA: Diagnosis not present

## 2018-01-14 DIAGNOSIS — M4856XD Collapsed vertebra, not elsewhere classified, lumbar region, subsequent encounter for fracture with routine healing: Secondary | ICD-10-CM | POA: Diagnosis not present

## 2018-01-16 DIAGNOSIS — Z9181 History of falling: Secondary | ICD-10-CM | POA: Diagnosis not present

## 2018-01-16 DIAGNOSIS — Z7984 Long term (current) use of oral hypoglycemic drugs: Secondary | ICD-10-CM | POA: Diagnosis not present

## 2018-01-16 DIAGNOSIS — I10 Essential (primary) hypertension: Secondary | ICD-10-CM | POA: Diagnosis not present

## 2018-01-16 DIAGNOSIS — M4856XD Collapsed vertebra, not elsewhere classified, lumbar region, subsequent encounter for fracture with routine healing: Secondary | ICD-10-CM | POA: Diagnosis not present

## 2018-01-16 DIAGNOSIS — Z7982 Long term (current) use of aspirin: Secondary | ICD-10-CM | POA: Diagnosis not present

## 2018-01-16 DIAGNOSIS — E1165 Type 2 diabetes mellitus with hyperglycemia: Secondary | ICD-10-CM | POA: Diagnosis not present

## 2018-01-21 DIAGNOSIS — M4856XD Collapsed vertebra, not elsewhere classified, lumbar region, subsequent encounter for fracture with routine healing: Secondary | ICD-10-CM | POA: Diagnosis not present

## 2018-01-21 DIAGNOSIS — Z7982 Long term (current) use of aspirin: Secondary | ICD-10-CM | POA: Diagnosis not present

## 2018-01-21 DIAGNOSIS — Z9181 History of falling: Secondary | ICD-10-CM | POA: Diagnosis not present

## 2018-01-21 DIAGNOSIS — Z7984 Long term (current) use of oral hypoglycemic drugs: Secondary | ICD-10-CM | POA: Diagnosis not present

## 2018-01-21 DIAGNOSIS — E1165 Type 2 diabetes mellitus with hyperglycemia: Secondary | ICD-10-CM | POA: Diagnosis not present

## 2018-01-21 DIAGNOSIS — I10 Essential (primary) hypertension: Secondary | ICD-10-CM | POA: Diagnosis not present

## 2018-01-23 DIAGNOSIS — E1165 Type 2 diabetes mellitus with hyperglycemia: Secondary | ICD-10-CM | POA: Diagnosis not present

## 2018-01-23 DIAGNOSIS — Z9181 History of falling: Secondary | ICD-10-CM | POA: Diagnosis not present

## 2018-01-23 DIAGNOSIS — I10 Essential (primary) hypertension: Secondary | ICD-10-CM | POA: Diagnosis not present

## 2018-01-23 DIAGNOSIS — M4856XD Collapsed vertebra, not elsewhere classified, lumbar region, subsequent encounter for fracture with routine healing: Secondary | ICD-10-CM | POA: Diagnosis not present

## 2018-01-23 DIAGNOSIS — Z7984 Long term (current) use of oral hypoglycemic drugs: Secondary | ICD-10-CM | POA: Diagnosis not present

## 2018-01-23 DIAGNOSIS — Z7982 Long term (current) use of aspirin: Secondary | ICD-10-CM | POA: Diagnosis not present

## 2018-02-12 ENCOUNTER — Other Ambulatory Visit: Payer: Self-pay | Admitting: *Deleted

## 2018-02-12 DIAGNOSIS — R188 Other ascites: Secondary | ICD-10-CM

## 2018-02-12 MED ORDER — FUROSEMIDE 40 MG PO TABS: ORAL_TABLET | ORAL | 0 refills | Status: DC

## 2018-02-21 ENCOUNTER — Other Ambulatory Visit: Payer: Self-pay | Admitting: *Deleted

## 2018-02-21 MED ORDER — OMEPRAZOLE 20 MG PO CPDR: DELAYED_RELEASE_CAPSULE | ORAL | 0 refills | Status: DC

## 2018-02-21 MED ORDER — POTASSIUM CHLORIDE CRYS ER 10 MEQ PO TBCR: EXTENDED_RELEASE_TABLET | ORAL | 0 refills | Status: DC

## 2018-02-21 MED ORDER — GABAPENTIN 300 MG PO CAPS: 300.0000 mg | ORAL_CAPSULE | Freq: Every day | ORAL | 0 refills | Status: DC

## 2018-02-22 ENCOUNTER — Ambulatory Visit (INDEPENDENT_AMBULATORY_CARE_PROVIDER_SITE_OTHER): Payer: Medicare HMO

## 2018-02-22 ENCOUNTER — Other Ambulatory Visit: Payer: Self-pay | Admitting: *Deleted

## 2018-02-22 DIAGNOSIS — I5032 Chronic diastolic (congestive) heart failure: Secondary | ICD-10-CM

## 2018-02-22 DIAGNOSIS — E1122 Type 2 diabetes mellitus with diabetic chronic kidney disease: Secondary | ICD-10-CM | POA: Diagnosis not present

## 2018-02-22 DIAGNOSIS — Z87440 Personal history of urinary (tract) infections: Secondary | ICD-10-CM

## 2018-02-22 DIAGNOSIS — Z7982 Long term (current) use of aspirin: Secondary | ICD-10-CM | POA: Diagnosis not present

## 2018-02-22 DIAGNOSIS — Z6832 Body mass index (BMI) 32.0-32.9, adult: Secondary | ICD-10-CM | POA: Diagnosis not present

## 2018-02-22 DIAGNOSIS — N183 Chronic kidney disease, stage 3 (moderate): Secondary | ICD-10-CM | POA: Diagnosis not present

## 2018-02-22 DIAGNOSIS — I130 Hypertensive heart and chronic kidney disease with heart failure and stage 1 through stage 4 chronic kidney disease, or unspecified chronic kidney disease: Secondary | ICD-10-CM | POA: Diagnosis not present

## 2018-02-22 DIAGNOSIS — Z7984 Long term (current) use of oral hypoglycemic drugs: Secondary | ICD-10-CM

## 2018-02-22 MED ORDER — SITAGLIPTIN PHOSPHATE 50 MG PO TABS: 50.0000 mg | ORAL_TABLET | Freq: Every day | ORAL | 0 refills | Status: DC

## 2018-02-26 ENCOUNTER — Ambulatory Visit (INDEPENDENT_AMBULATORY_CARE_PROVIDER_SITE_OTHER): Payer: Medicare HMO | Admitting: Family Medicine

## 2018-02-26 ENCOUNTER — Ambulatory Visit: Payer: Medicare HMO

## 2018-02-26 ENCOUNTER — Encounter: Payer: Self-pay | Admitting: Family Medicine

## 2018-02-26 VITALS — BP 116/65 | HR 65 | Temp 97.6°F | Ht 65.0 in | Wt 187.0 lb

## 2018-02-26 DIAGNOSIS — F0280 Dementia in other diseases classified elsewhere without behavioral disturbance: Secondary | ICD-10-CM

## 2018-02-26 DIAGNOSIS — E119 Type 2 diabetes mellitus without complications: Secondary | ICD-10-CM

## 2018-02-26 DIAGNOSIS — J309 Allergic rhinitis, unspecified: Secondary | ICD-10-CM | POA: Diagnosis not present

## 2018-02-26 DIAGNOSIS — Z23 Encounter for immunization: Secondary | ICD-10-CM | POA: Diagnosis not present

## 2018-02-26 DIAGNOSIS — I10 Essential (primary) hypertension: Secondary | ICD-10-CM | POA: Diagnosis not present

## 2018-02-26 DIAGNOSIS — G301 Alzheimer's disease with late onset: Secondary | ICD-10-CM

## 2018-02-26 DIAGNOSIS — E039 Hypothyroidism, unspecified: Secondary | ICD-10-CM | POA: Diagnosis not present

## 2018-02-26 LAB — BAYER DCA HB A1C WAIVED: HB A1C (BAYER DCA - WAIVED): 8.6 % — ABNORMAL HIGH (ref <7.0)

## 2018-02-26 MED ORDER — OMEPRAZOLE 20 MG PO CPDR: DELAYED_RELEASE_CAPSULE | ORAL | 3 refills | Status: AC

## 2018-02-26 MED ORDER — FLUTICASONE PROPIONATE 50 MCG/ACT NA SUSP: 1.0000 | Freq: Two times a day (BID) | NASAL | 6 refills | Status: AC | PRN

## 2018-02-26 MED ORDER — SITAGLIPTIN PHOSPHATE 50 MG PO TABS: 50.0000 mg | ORAL_TABLET | Freq: Every day | ORAL | 1 refills | Status: DC

## 2018-02-26 MED ORDER — SIMVASTATIN 20 MG PO TABS: 20.0000 mg | ORAL_TABLET | Freq: Every day | ORAL | 1 refills | Status: DC

## 2018-02-26 MED ORDER — METOPROLOL SUCCINATE ER 25 MG PO TB24: 25.0000 mg | ORAL_TABLET | Freq: Every day | ORAL | 3 refills | Status: DC

## 2018-02-26 MED ORDER — GABAPENTIN 300 MG PO CAPS: 300.0000 mg | ORAL_CAPSULE | Freq: Every day | ORAL | 3 refills | Status: DC

## 2018-02-26 NOTE — Progress Notes (Signed)
Subjective: CC: dementia, DM2, HTN PCP: Timmothy Euler, MD PTW:[facility] [name] is a 84 y.o. male presenting to clinic today for:  1. DM2 w/ HLD Patient is brought to the office by his daughter, who he resides with.  She notes that his blood sugars have been running close to 200s.  He has several dietary indiscretions, she reports that he really enjoys his sweets.  Denies any foot ulcerations.  He takes Januvia 50 mg daily and zocor 20 daily.  2.  Hypertension Patient is compliant with  metoprolol XL 25 mg and Lasix 40 mg daily.  He is not complain of any chest pain or shortness of breath.  No falls but he is relatively dependent on walker for ambulation and/or wheelchair.  3.  Dementia Patient with Alzheimer's dementia.  He is totally dependent upon his daughter for care.  She used to work in a dementia unit and is very familiar with how to care for her father.  She reports that she works hard to keep the integrity of his skin, particularly in the GU area, intact and clean.  He uses walker and/or wheelchair as above for ambulation.  She reports no behavioral disturbances.  Sleep is good.  4. Hypothyroidism, acquired atrophy Patient has been stable on Synthroid 88 mcg daily for some time.  His wife reports that he does have some problems with constipation but otherwise is asymptomatic.  No history of surgery to the neck.  ROS: Per HPI  No Known Allergies Past Medical History:  Diagnosis Date  . Aortic insufficiency   . Aortic valve sclerosis   . Bronchitis   . CHF (congestive heart failure) ([place])   . Dementia   . Diabetes mellitus    Diet control   . Diverticulosis   . Dyslipidemia   . GERD (gastroesophageal reflux disease)   . Hard of hearing   . Hemorrhoids   . Hernia cerebri ([place])   . History of pneumonia   . Hypertension   . Hypothyroidism   . MI (myocardial infarction) ([place]) 1996   percutaneous coronary intervention   . Small bowel obstruction (HCC)     Current  Outpatient Medications:  .  albuterol (PROVENTIL) (2.5 MG/3ML) 0.083% nebulizer solution, Take 3 mLs (2.5 mg total) by nebulization every 6 (six) hours as needed for wheezing or shortness of breath., Disp: 150 mL, Rfl: 3 .  aspirin 81 MG tablet, Take 81 mg by mouth daily., Disp: , Rfl:  .  Blood Glucose Monitoring Suppl (ACCU-CHEK AVIVA PLUS) w/Device KIT, USE  AS  INSTRUCTED, Disp: 1 kit, Rfl: 0 .  Blood Glucose Monitoring Suppl (ACCU-CHEK AVIVA) device, Use as instructed, Disp: 1 each, Rfl: 0 .  cholecalciferol (VITAMIN D) 1000 units tablet, Take 1,000 Units by mouth daily., Disp: , Rfl:  .  clotrimazole-betamethasone (LOTRISONE) cream, , Disp: , Rfl:  .  Diaper Rash Products (DESITIN EX), Apply 1 application topically daily., Disp: , Rfl:  .  docusate sodium (COLACE) 100 MG capsule, Take 1 capsule (100 mg total) by mouth at bedtime., Disp: 10 capsule, Rfl: 11 .  fluticasone (FLONASE) 50 MCG/ACT nasal spray, Place 1 spray into both nostrils 2 (two) times daily as needed for allergies or rhinitis., Disp: 16 g, Rfl: 6 .  furosemide (LASIX) 40 MG tablet, TAKE (2) TABLETS DAILY, Disp: 60 tablet, Rfl: 0 .  gabapentin (NEURONTIN) 300 MG capsule, Take 1 capsule (300 mg total) by mouth at bedtime., Disp: 90 capsule, Rfl: 3 .  glucose  blood (ACCU-CHEK AVIVA) test strip, 1 each by Other route daily. Use as instructed, Disp: 100 each, Rfl: 3 .  Lancet Devices (ACCU-CHEK SOFTCLIX) lancets, 1 each by Other route daily., Disp: 1 each, Rfl: 3 .  levothyroxine (SYNTHROID, LEVOTHROID) 88 MCG tablet, TAKE 1 TABLET DAILY, Disp: 90 tablet, Rfl: 3 .  meclizine (ANTIVERT) 25 MG tablet, Take 1 tablet (25 mg total) by mouth 3 (three) times daily as needed for dizziness., Disp: 30 tablet, Rfl: 0 .  metoprolol succinate (TOPROL-XL) 25 MG 24 hr tablet, Take 1 tablet (25 mg total) by mouth daily., Disp: 90 tablet, Rfl: 3 .  Natural Senna Laxative 64.8-324 MG TABS, Take 1 tablet by mouth at bedtime. , Disp: , Rfl:  .   Omega-3 Fatty Acids (FISH OIL) 1000 MG CAPS, Take 1 capsule by mouth daily., Disp: , Rfl:  .  omeprazole (PRILOSEC) 20 MG capsule, TAKE (1) CAPSULE DAILY, Disp: 90 capsule, Rfl: 3 .  ondansetron (ZOFRAN) 8 MG tablet, Take 1 tablet (8 mg total) by mouth every 8 (eight) hours as needed for nausea or vomiting., Disp: 20 tablet, Rfl: 0 .  potassium chloride (K-DUR,KLOR-CON) 10 MEQ tablet, Take 1 tablet 2 (two) times daily. As a potassium supplement, Disp: 60 tablet, Rfl: 0 .  simvastatin (ZOCOR) 20 MG tablet, Take 1 tablet (20 mg total) by mouth daily., Disp: 90 tablet, Rfl: 1 .  sitaGLIPtin (JANUVIA) 50 MG tablet, Take 1 tablet (50 mg total) by mouth daily., Disp: 90 tablet, Rfl: 1 .  traMADol (ULTRAM) 50 MG tablet, Take 1 tablet (50 mg total) by mouth 4 (four) times daily as needed for moderate pain., Disp: 20 tablet, Rfl: 2 Social History   Socioeconomic History  . Marital status: Married    Spouse name: Not on file  . Number of children: Not on file  . Years of education: Not on file  . Highest education level: Not on file  Occupational History  . Occupation: Retired  Scientific laboratory technician  . Financial resource strain: Not on file  . Food insecurity:    Worry: Not on file    Inability: Not on file  . Transportation needs:    Medical: Not on file    Non-medical: Not on file  Tobacco Use  . Smoking status: Former Smoker    Last attempt to quit: 08/09/1942    Years since quitting: 75.6  . Smokeless tobacco: Never Used  Substance and Sexual Activity  . Alcohol use: No  . Drug use: No  . Sexual activity: Never  Lifestyle  . Physical activity:    Days per week: Not on file    Minutes per session: Not on file  . Stress: Not on file  Relationships  . Social connections:    Talks on phone: Not on file    Gets together: Not on file    Attends religious service: Not on file    Active member of club or organization: Not on file    Attends meetings of clubs or organizations: Not on file     Relationship status: Not on file  . Intimate partner violence:    Fear of current or ex partner: Not on file    Emotionally abused: Not on file    Physically abused: Not on file    Forced sexual activity: Not on file  Other Topics Concern  . Not on file  Social History Narrative  . Not on file   Family History  Problem Relation Age of Onset  .  Colon cancer Neg Hx     Objective: Office vital signs reviewed. BP 116/65   Pulse 65   Temp 97.6 F (36.4 C) (Oral)   Ht 5' 5"  (1.651 m)   Wt 187 lb (84.8 kg)   BMI 31.12 kg/m   Physical Examination:  General: Awake, No acute distress HEENT: sclera white, MMM Cardio: regular rate and rhythm, S1S2 heard, no murmurs appreciated Pulm: clear to auscultation bilaterally, no wheezes, rhonchi or rales; normal work of breathing on room air GI: protuberant abdomen. MSK: using wheelchair for ambulation Extremities: Some venous stasis changes noted anteriorly.  Very little edema noted.  No significant skin breakdown appreciated. Psych: mood stable. Aphasic.  Results for orders placed or performed in visit on 02/26/18 (from the past 24 hour(s))  Bayer DCA Hb A1c Waived     Status: Abnormal   Collection Time: 02/26/18 11:00 AM  Result Value Ref Range   HB A1C (BAYER DCA - WAIVED) 8.6 (H) <7.0 %   Narrative   Performed at:  [address], [place], [place]  [id number] Lab Director: Colletta [facility], Phone:  [phone]     Assessment/ Plan: 84 y.o. male   1. Diabetes mellitus type II, non insulin dependent (HCC) A1c 8.6.  Given multiple comorbidities including Alzheimer's dementia goal for patient is between 8 and 9.  I think that the risk of hypoglycemic episodes outweigh the risks of hyperglycemia at this point.  Continue Januvia at current dose. - Bayer DCA Hb A1c Waived  2. Hypothyroidism, unspecified type Will check TSH.  Has been stable on Synthroid for some time. - TSH  3. Essential  hypertension Blood pressure is within normal limits.  No changes made.  We discussed that patient may benefit from discontinuing some of his medications (zocor etc).  Risks of polypharmacy in the setting of Alzheimers should be considered.  Daughter to think it over.  May try and clean up the med list a bit more at next visit. - Basic Metabolic Panel  4. Late onset Alzheimer's disease without behavioral disturbance ([place]) Has 24-hour care by his daughter with 3 days/week of extra care by another caregiver.  At this point not demonstrating any aggressive behaviors and not posing a danger to himself or others.  5. Allergic rhinitis, unspecified seasonality, unspecified trigger - fluticasone (FLONASE) 50 MCG/ACT nasal spray; Place 1 spray into both nostrils 2 (two) times daily as needed for allergies or rhinitis.  Dispense: 16 g; Refill: 6   Orders Placed This Encounter  Procedures  . Bayer DCA Hb A1c Waived  . TSH  . Basic Metabolic Panel   Meds ordered this encounter  Medications  . fluticasone (FLONASE) 50 MCG/ACT nasal spray    Sig: Place 1 spray into both nostrils 2 (two) times daily as needed for allergies or rhinitis.    Dispense:  16 g    Refill:  6  . gabapentin (NEURONTIN) 300 MG capsule    Sig: Take 1 capsule (300 mg total) by mouth at bedtime.    Dispense:  90 capsule    Refill:  3  . metoprolol succinate (TOPROL-XL) 25 MG 24 hr tablet    Sig: Take 1 tablet (25 mg total) by mouth daily.    Dispense:  90 tablet    Refill:  3  . omeprazole (PRILOSEC) 20 MG capsule    Sig: TAKE (1) CAPSULE DAILY    Dispense:  90 capsule    Refill:  3  .  simvastatin (ZOCOR) 20 MG tablet    Sig: Take 1 tablet (20 mg total) by mouth daily.    Dispense:  90 tablet    Refill:  1  . sitaGLIPtin (JANUVIA) 50 MG tablet    Sig: Take 1 tablet (50 mg total) by mouth daily.    Dispense:  90 tablet    Refill:  [place], [place] [phone]

## 2018-02-27 LAB — BASIC METABOLIC PANEL
BUN/Creatinine Ratio: 10 (ref 10–24)
BUN: 16 mg/dL (ref 10–36)
CALCIUM: 9.2 mg/dL (ref 8.6–10.2)
CO2: 25 mmol/L (ref 20–29)
CREATININE: 1.57 mg/dL — AB (ref 0.76–1.27)
Chloride: 99 mmol/L (ref 96–106)
GFR calc Af Amer: 44 mL/min/{1.73_m2} — ABNORMAL LOW (ref >59)
GFR, EST NON AFRICAN AMERICAN: 38 mL/min/{1.73_m2} — AB (ref >59)
GLUCOSE: 183 mg/dL — AB (ref 65–99)
POTASSIUM: 3.3 mmol/L — AB (ref 3.5–5.2)
Sodium: 140 mmol/L (ref 134–144)

## 2018-02-27 LAB — TSH: TSH: 1.91 u[IU]/mL (ref 0.450–4.500)

## 2018-03-12 ENCOUNTER — Other Ambulatory Visit: Payer: Self-pay | Admitting: Family Medicine

## 2018-03-12 DIAGNOSIS — R188 Other ascites: Secondary | ICD-10-CM

## 2018-03-18 ENCOUNTER — Other Ambulatory Visit: Payer: Self-pay | Admitting: Family Medicine

## 2018-04-05 ENCOUNTER — Ambulatory Visit: Payer: Medicare HMO | Admitting: Family Medicine

## 2018-04-16 DIAGNOSIS — M79676 Pain in unspecified toe(s): Secondary | ICD-10-CM | POA: Diagnosis not present

## 2018-04-16 DIAGNOSIS — E1142 Type 2 diabetes mellitus with diabetic polyneuropathy: Secondary | ICD-10-CM | POA: Diagnosis not present

## 2018-04-16 DIAGNOSIS — I130 Hypertensive heart and chronic kidney disease with heart failure and stage 1 through stage 4 chronic kidney disease, or unspecified chronic kidney disease: Secondary | ICD-10-CM | POA: Diagnosis not present

## 2018-04-16 DIAGNOSIS — B351 Tinea unguium: Secondary | ICD-10-CM | POA: Diagnosis not present

## 2018-04-16 DIAGNOSIS — L84 Corns and callosities: Secondary | ICD-10-CM | POA: Diagnosis not present

## 2018-04-22 ENCOUNTER — Telehealth: Payer: Self-pay | Admitting: Family Medicine

## 2018-04-23 NOTE — Telephone Encounter (Signed)
Spoke to [place], his daughter.  Form revised and will be refaxed.  She will contact me if there are issues going forward.

## 2018-04-23 NOTE — Telephone Encounter (Signed)
Pt's daughter is calling with questions regarding pt needing "shower installed" and "bath tub removed" by [facility] which she says you are aware of this. She would like to speak to you.

## 2018-05-01 ENCOUNTER — Ambulatory Visit (INDEPENDENT_AMBULATORY_CARE_PROVIDER_SITE_OTHER): Payer: Medicare HMO

## 2018-05-01 DIAGNOSIS — N183 Chronic kidney disease, stage 3 (moderate): Secondary | ICD-10-CM | POA: Diagnosis not present

## 2018-05-01 DIAGNOSIS — Z7984 Long term (current) use of oral hypoglycemic drugs: Secondary | ICD-10-CM | POA: Diagnosis not present

## 2018-05-01 DIAGNOSIS — Z6832 Body mass index (BMI) 32.0-32.9, adult: Secondary | ICD-10-CM | POA: Diagnosis not present

## 2018-05-01 DIAGNOSIS — I130 Hypertensive heart and chronic kidney disease with heart failure and stage 1 through stage 4 chronic kidney disease, or unspecified chronic kidney disease: Secondary | ICD-10-CM

## 2018-05-01 DIAGNOSIS — I5032 Chronic diastolic (congestive) heart failure: Secondary | ICD-10-CM

## 2018-05-01 DIAGNOSIS — E1122 Type 2 diabetes mellitus with diabetic chronic kidney disease: Secondary | ICD-10-CM | POA: Diagnosis not present

## 2018-05-08 DIAGNOSIS — Z9842 Cataract extraction status, left eye: Secondary | ICD-10-CM | POA: Diagnosis not present

## 2018-05-08 DIAGNOSIS — H02102 Unspecified ectropion of right lower eyelid: Secondary | ICD-10-CM | POA: Diagnosis not present

## 2018-05-08 DIAGNOSIS — H26491 Other secondary cataract, right eye: Secondary | ICD-10-CM | POA: Diagnosis not present

## 2018-05-08 DIAGNOSIS — H04123 Dry eye syndrome of bilateral lacrimal glands: Secondary | ICD-10-CM | POA: Diagnosis not present

## 2018-06-04 ENCOUNTER — Ambulatory Visit (INDEPENDENT_AMBULATORY_CARE_PROVIDER_SITE_OTHER): Payer: Medicare HMO | Admitting: Family Medicine

## 2018-06-04 VITALS — BP 120/68 | HR 65 | Temp 97.9°F | Ht 65.0 in | Wt 188.0 lb

## 2018-06-04 DIAGNOSIS — N183 Chronic kidney disease, stage 3 unspecified: Secondary | ICD-10-CM

## 2018-06-04 DIAGNOSIS — E119 Type 2 diabetes mellitus without complications: Secondary | ICD-10-CM | POA: Diagnosis not present

## 2018-06-04 DIAGNOSIS — H01002 Unspecified blepharitis right lower eyelid: Secondary | ICD-10-CM | POA: Diagnosis not present

## 2018-06-04 DIAGNOSIS — I10 Essential (primary) hypertension: Secondary | ICD-10-CM

## 2018-06-04 DIAGNOSIS — F0280 Dementia in other diseases classified elsewhere without behavioral disturbance: Secondary | ICD-10-CM | POA: Diagnosis not present

## 2018-06-04 DIAGNOSIS — G301 Alzheimer's disease with late onset: Secondary | ICD-10-CM | POA: Diagnosis not present

## 2018-06-04 LAB — BAYER DCA HB A1C WAIVED: HB A1C (BAYER DCA - WAIVED): 8.3 % — ABNORMAL HIGH (ref <7.0)

## 2018-06-04 MED ORDER — ERYTHROMYCIN 5 MG/GM OP OINT: 1.0000 "application " | TOPICAL_OINTMENT | Freq: Three times a day (TID) | OPHTHALMIC | 0 refills | Status: AC

## 2018-06-04 NOTE — Patient Instructions (Addendum)
You had labs performed today.  You will be contacted with the results of the labs once they are available, usually in the next 3 business days for routine lab work.   We have discontinued some medications on his list today.  We discussed holding the Omeprazole and seeing how things go.   We may consider stopping Simvastatin at some point (this is cholesterol medication) given his advanced dementia.

## 2018-06-04 NOTE — Progress Notes (Signed)
Subjective: CC: dementia, DM2, HTN PCP: Janora Norlander, DO OVZ:CHYI [name] is a 85 y.o. male presenting to clinic today for:  1. DM2 w/ HLD, HTN and CKD3 Patient is brought to the office by his daughter today.  She reports that blood sugars are typically running between 170 and low 200s.  He has no hypoglycemic episodes.  He certainly has no blood sugars greater than the mid 200s.  Overall he leads a fairly sedentary lifestyle.  She does admit that his wife gives him candy and cookies frequently.  She has been attempting to increase hydration in this patient but he is reluctant to drink just water.  She has been using sugarless Gatorade.  He takes Januvia 50 mg daily and zocor 20 daily.  Patient is compliant with  metoprolol XL 25 mg and Lasix 40 mg daily.  Denies any chest pain, shortness of breath, falls.  Again, he is primarily dependent on a walker and wheelchair for ambulation.  2.  Alzheimer's dementia Patient resides with his daughter and is totally dependent upon her for care.  She does report intermittent episodes of aggression but these episodes typically are preceded by him not getting his "dentures" or getting out of the house when he wants to.  He is been physically aggressive towards others.  No self-harm.  She does note that sometimes he sees things that are not there.  He has not been to a specialist in the past but daughter is not ready to have him seen by 1 yet either.  3.  Red eyelid Patient's daughter notes that patient has a history of surgical repair of bilateral eyelids.  His right eyelid seems to be a little bit redder and more inflamed.  She has been using an over-the-counter eye ointments and has been trying to keep area clean and maintained.  She declines seeing the specialist again and does not want to put him through another surgery.  ROS: Per HPI  No Known Allergies Past Medical History:  Diagnosis Date  . Aortic insufficiency   . Aortic valve sclerosis    . Bronchitis   . CHF (congestive heart failure) ([place])   . Dementia ([place])   . Diabetes mellitus    Diet control   . Diverticulosis   . Dyslipidemia   . GERD (gastroesophageal reflux disease)   . Hard of hearing   . Hemorrhoids   . Hernia cerebri ([place])   . History of pneumonia   . Hypertension   . Hypothyroidism   . MI (myocardial infarction) ([place]) 1996   percutaneous coronary intervention   . Small bowel obstruction (HCC)     Current Outpatient Medications:  .  albuterol (PROVENTIL) (2.5 MG/3ML) 0.083% nebulizer solution, Take 3 mLs (2.5 mg total) by nebulization every 6 (six) hours as needed for wheezing or shortness of breath., Disp: 150 mL, Rfl: 3 .  aspirin 81 MG tablet, Take 81 mg by mouth daily., Disp: , Rfl:  .  Blood Glucose Monitoring Suppl (ACCU-CHEK AVIVA PLUS) w/Device KIT, USE  AS  INSTRUCTED, Disp: 1 kit, Rfl: 0 .  cholecalciferol (VITAMIN D) 1000 units tablet, Take 1,000 Units by mouth daily., Disp: , Rfl:  .  clotrimazole-betamethasone (LOTRISONE) cream, , Disp: , Rfl:  .  Diaper Rash Products (DESITIN EX), Apply 1 application topically daily., Disp: , Rfl:  .  docusate sodium (COLACE) 100 MG capsule, Take 1 capsule (100 mg total) by mouth at bedtime., Disp: 10 capsule, Rfl: 11 .  fluticasone (  FLONASE) 50 MCG/ACT nasal spray, Place 1 spray into both nostrils 2 (two) times daily as needed for allergies or rhinitis., Disp: 16 g, Rfl: 6 .  furosemide (LASIX) 40 MG tablet, TAKE (2) TABLETS DAILY, Disp: 60 tablet, Rfl: 2 .  gabapentin (NEURONTIN) 300 MG capsule, Take 1 capsule (300 mg total) by mouth at bedtime., Disp: 90 capsule, Rfl: 3 .  glucose blood (ACCU-CHEK AVIVA) test strip, 1 each by Other route daily. Use as instructed, Disp: 100 each, Rfl: 3 .  Lancet Devices (ACCU-CHEK SOFTCLIX) lancets, 1 each by Other route daily., Disp: 1 each, Rfl: 3 .  levothyroxine (SYNTHROID, LEVOTHROID) 88 MCG tablet, TAKE 1 TABLET DAILY, Disp: 90 tablet, Rfl: 3 .  meclizine  (ANTIVERT) 25 MG tablet, Take 1 tablet (25 mg total) by mouth 3 (three) times daily as needed for dizziness., Disp: 30 tablet, Rfl: 0 .  metoprolol succinate (TOPROL-XL) 25 MG 24 hr tablet, Take 1 tablet (25 mg total) by mouth daily., Disp: 90 tablet, Rfl: 3 .  Natural Senna Laxative 64.8-324 MG TABS, Take 1 tablet by mouth at bedtime. , Disp: , Rfl:  .  Omega-3 Fatty Acids (FISH OIL) 1000 MG CAPS, Take 1 capsule by mouth daily., Disp: , Rfl:  .  omeprazole (PRILOSEC) 20 MG capsule, TAKE (1) CAPSULE DAILY, Disp: 90 capsule, Rfl: 3 .  ondansetron (ZOFRAN) 8 MG tablet, Take 1 tablet (8 mg total) by mouth every 8 (eight) hours as needed for nausea or vomiting., Disp: 20 tablet, Rfl: 0 .  potassium chloride (K-DUR) 10 MEQ tablet, Take 1 tablet 2 (two) times daily. As a potassium supplement, Disp: 60 tablet, Rfl: 5 .  simvastatin (ZOCOR) 20 MG tablet, Take 1 tablet (20 mg total) by mouth daily., Disp: 90 tablet, Rfl: 1 .  sitaGLIPtin (JANUVIA) 50 MG tablet, Take 1 tablet (50 mg total) by mouth daily., Disp: 90 tablet, Rfl: 1 .  traMADol (ULTRAM) 50 MG tablet, Take 1 tablet (50 mg total) by mouth 4 (four) times daily as needed for moderate pain., Disp: 20 tablet, Rfl: 2 Social History   Socioeconomic History  . Marital status: Married    Spouse name: Not on file  . Number of children: Not on file  . Years of education: Not on file  . Highest education level: Not on file  Occupational History  . Occupation: Retired  Scientific laboratory technician  . Financial resource strain: Not on file  . Food insecurity:    Worry: Not on file    Inability: Not on file  . Transportation needs:    Medical: Not on file    Non-medical: Not on file  Tobacco Use  . Smoking status: Former Smoker    Last attempt to quit: 08/09/1942    Years since quitting: 75.8  . Smokeless tobacco: Never Used  Substance and Sexual Activity  . Alcohol use: No  . Drug use: No  . Sexual activity: Never  Lifestyle  . Physical activity:    Days  per week: Not on file    Minutes per session: Not on file  . Stress: Not on file  Relationships  . Social connections:    Talks on phone: Not on file    Gets together: Not on file    Attends religious service: Not on file    Active member of club or organization: Not on file    Attends meetings of clubs or organizations: Not on file    Relationship status: Not on file  . Intimate  partner violence:    Fear of current or ex partner: Not on file    Emotionally abused: Not on file    Physically abused: Not on file    Forced sexual activity: Not on file  Other Topics Concern  . Not on file  Social History Narrative  . Not on file   Family History  Problem Relation Age of Onset  . Colon cancer Neg Hx     Objective: Office vital signs reviewed. BP 120/68   Pulse 65   Temp 97.9 F (36.6 C) (Oral)   Ht 5' 5"  (1.651 m)   Wt 188 lb (85.3 kg)   BMI 31.28 kg/m   Physical Examination:  General: Awake, No acute distress HEENT: sclera white, MMM Cardio: regular rate and rhythm, S1S2 heard, no murmurs appreciated Pulm: clear to auscultation bilaterally, no wheezes, rhonchi or rales; normal work of breathing on room air Psych: mood stable.  He does intermittently interact with the provider.   Assessment/ Plan: 85 y.o. male   1. Diabetes mellitus type II, non insulin dependent (HCC) A1c was 8.3 today.  Given advanced Alzheimer's this level is appropriate for age.  Continue Januvia.  Follow-up in 3 months for repeat. - Bayer DCA Hb A1c Waived  2. Essential hypertension Blood pressure at goal.  We did discuss that given history of CAD and diabetes that we should continue his blood pressure medications and cholesterol medicines, particularly since the patient's family feels that his quality of life continues to be good.  - Basic Metabolic Panel  3. Late onset Alzheimer's disease without behavioral disturbance ([place]) We did have a frank discussion today about risks and benefits of  continuing chronic medications given progressive Alzheimer's disease.  His daughter voiced good understanding and will continue to revisit this issue with each visit.  We did clean up his medication list today and discussed holding his omeprazole.  If he continues to be asymptomatic from a GI standpoint okay to discontinue completely.  4. CKD (chronic kidney disease), stage III ([place]) He had a small bump in serum creatinine last visit, we will check this again today. - Basic Metabolic Panel  5. Blepharitis of right lower eyelid, unspecified type Erythromycin ophthalmic ointment.  Apply ribbon to right lower lid 3 times daily for 7 days.  Continue home lubricants.  Patient's daughter declines referral to ophthalmology today.   Orders Placed This Encounter  Procedures  . Bayer DCA Hb A1c Waived  . Basic Metabolic Panel   Meds ordered this encounter  Medications  . erythromycin ophthalmic ointment    Sig: Place 1 application into the right eye 3 (three) times daily for 7 days.    Dispense:  3.5 g    Refill:  0     Craig Isa Windell Mould[place], DO [place] [phone]

## 2018-06-05 LAB — BASIC METABOLIC PANEL
BUN/Creatinine Ratio: 12 (ref 10–24)
BUN: 17 mg/dL (ref 10–36)
CALCIUM: 9 mg/dL (ref 8.6–10.2)
CHLORIDE: 95 mmol/L — AB (ref 96–106)
CO2: 25 mmol/L (ref 20–29)
Creatinine, Ser: 1.41 mg/dL — ABNORMAL HIGH (ref 0.76–1.27)
GFR calc Af Amer: 50 mL/min/{1.73_m2} — ABNORMAL LOW (ref >59)
GFR calc non Af Amer: 43 mL/min/{1.73_m2} — ABNORMAL LOW (ref >59)
Glucose: 185 mg/dL — ABNORMAL HIGH (ref 65–99)
POTASSIUM: 3.4 mmol/L — AB (ref 3.5–5.2)
Sodium: 137 mmol/L (ref 134–144)

## 2018-06-10 ENCOUNTER — Other Ambulatory Visit: Payer: Self-pay | Admitting: Family Medicine

## 2018-06-10 DIAGNOSIS — R188 Other ascites: Secondary | ICD-10-CM

## 2018-06-11 DIAGNOSIS — I130 Hypertensive heart and chronic kidney disease with heart failure and stage 1 through stage 4 chronic kidney disease, or unspecified chronic kidney disease: Secondary | ICD-10-CM | POA: Diagnosis not present

## 2018-08-01 DIAGNOSIS — B351 Tinea unguium: Secondary | ICD-10-CM | POA: Diagnosis not present

## 2018-08-01 DIAGNOSIS — L84 Corns and callosities: Secondary | ICD-10-CM | POA: Diagnosis not present

## 2018-08-01 DIAGNOSIS — M79676 Pain in unspecified toe(s): Secondary | ICD-10-CM | POA: Diagnosis not present

## 2018-08-01 DIAGNOSIS — E1142 Type 2 diabetes mellitus with diabetic polyneuropathy: Secondary | ICD-10-CM | POA: Diagnosis not present

## 2018-08-12 DIAGNOSIS — I130 Hypertensive heart and chronic kidney disease with heart failure and stage 1 through stage 4 chronic kidney disease, or unspecified chronic kidney disease: Secondary | ICD-10-CM | POA: Diagnosis not present

## 2018-08-27 ENCOUNTER — Other Ambulatory Visit: Payer: Self-pay | Admitting: *Deleted

## 2018-08-27 ENCOUNTER — Other Ambulatory Visit: Payer: Self-pay | Admitting: Family Medicine

## 2018-08-27 MED ORDER — LEVOTHYROXINE SODIUM 88 MCG PO TABS: 88.0000 ug | ORAL_TABLET | Freq: Every day | ORAL | 1 refills | Status: DC

## 2018-09-09 ENCOUNTER — Ambulatory Visit: Payer: Medicare HMO | Admitting: Family Medicine

## 2018-09-10 ENCOUNTER — Other Ambulatory Visit: Payer: Self-pay | Admitting: Family Medicine

## 2018-09-10 DIAGNOSIS — R188 Other ascites: Secondary | ICD-10-CM

## 2018-09-18 ENCOUNTER — Other Ambulatory Visit: Payer: Self-pay

## 2018-09-18 ENCOUNTER — Ambulatory Visit (INDEPENDENT_AMBULATORY_CARE_PROVIDER_SITE_OTHER): Payer: Medicare HMO

## 2018-09-18 DIAGNOSIS — I5032 Chronic diastolic (congestive) heart failure: Secondary | ICD-10-CM | POA: Diagnosis not present

## 2018-09-18 DIAGNOSIS — I130 Hypertensive heart and chronic kidney disease with heart failure and stage 1 through stage 4 chronic kidney disease, or unspecified chronic kidney disease: Secondary | ICD-10-CM | POA: Diagnosis not present

## 2018-09-18 DIAGNOSIS — N183 Chronic kidney disease, stage 3 (moderate): Secondary | ICD-10-CM | POA: Diagnosis not present

## 2018-09-18 DIAGNOSIS — Z6832 Body mass index (BMI) 32.0-32.9, adult: Secondary | ICD-10-CM | POA: Diagnosis not present

## 2018-09-18 DIAGNOSIS — E1122 Type 2 diabetes mellitus with diabetic chronic kidney disease: Secondary | ICD-10-CM | POA: Diagnosis not present

## 2018-09-21 IMAGING — DX DG LUMBAR SPINE 2-3V
3 series · 3 of 3 positions shown · non-contrast
Comparison: CT abdomen and pelvis 02/16/2017

CLINICAL DATA: Low back pain after a fall yesterday. Initial
encounter.

EXAM:
LUMBAR SPINE - 2-3 VIEW

[l-spine ap]
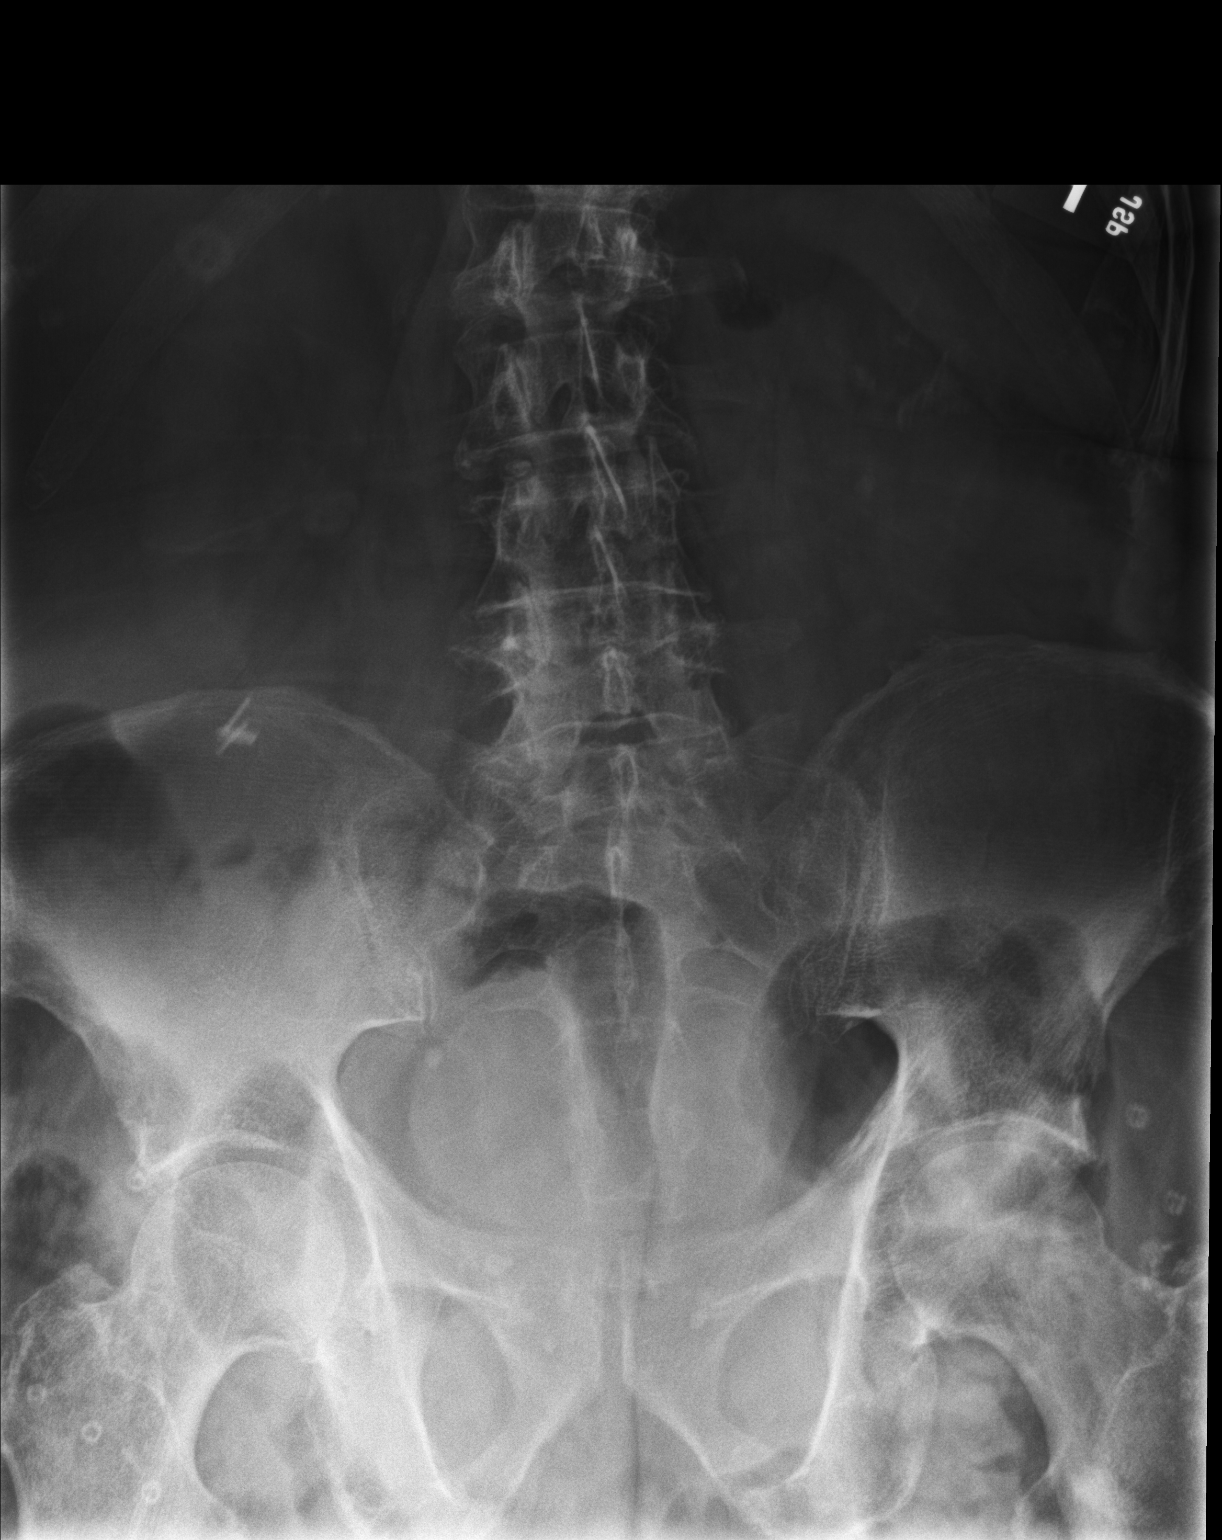

[l-spine lat (1 of 2)]
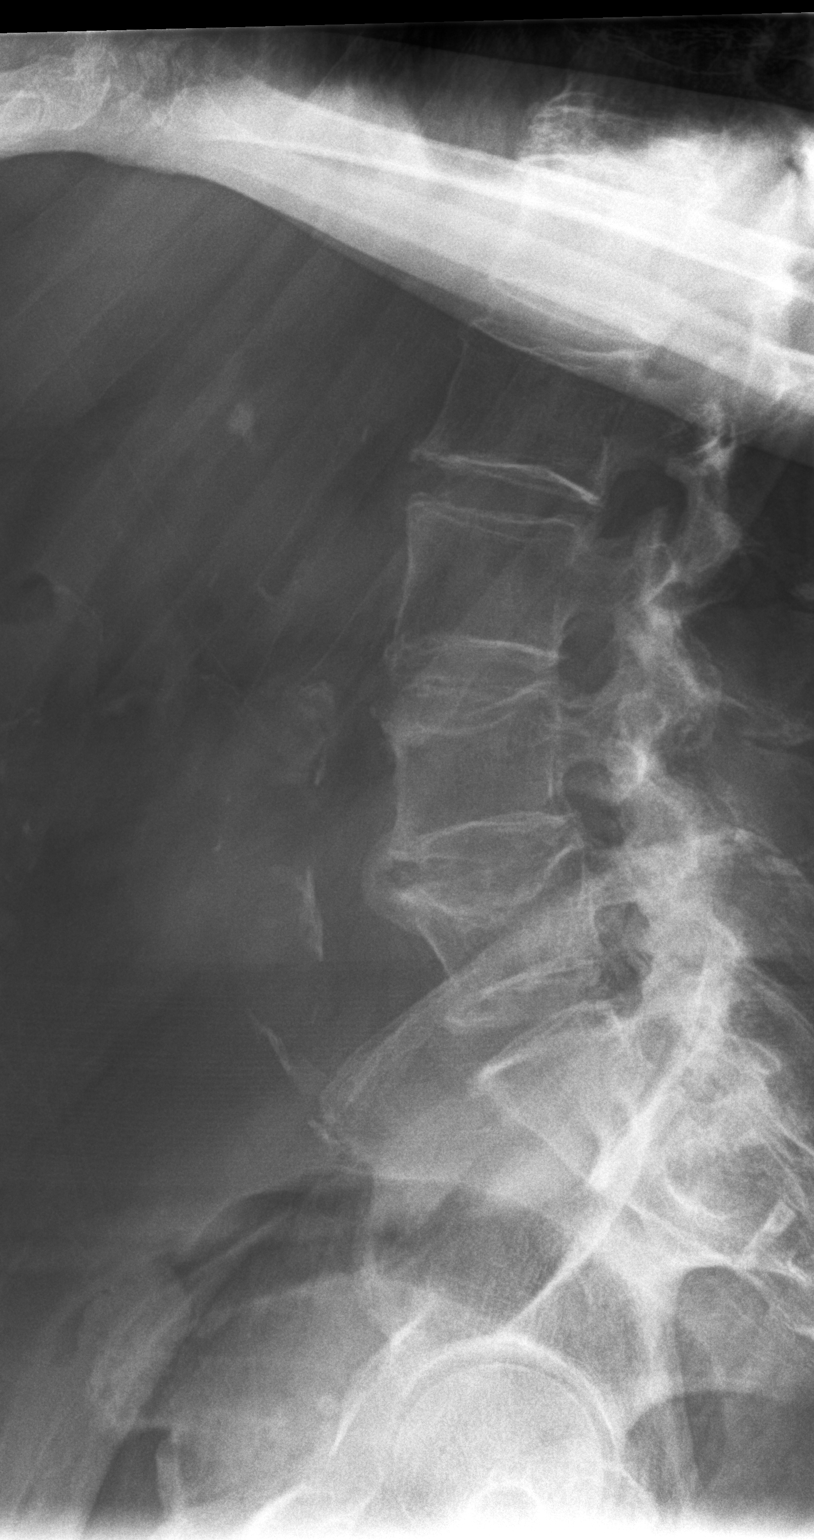

[l-spine lat (2 of 2)]
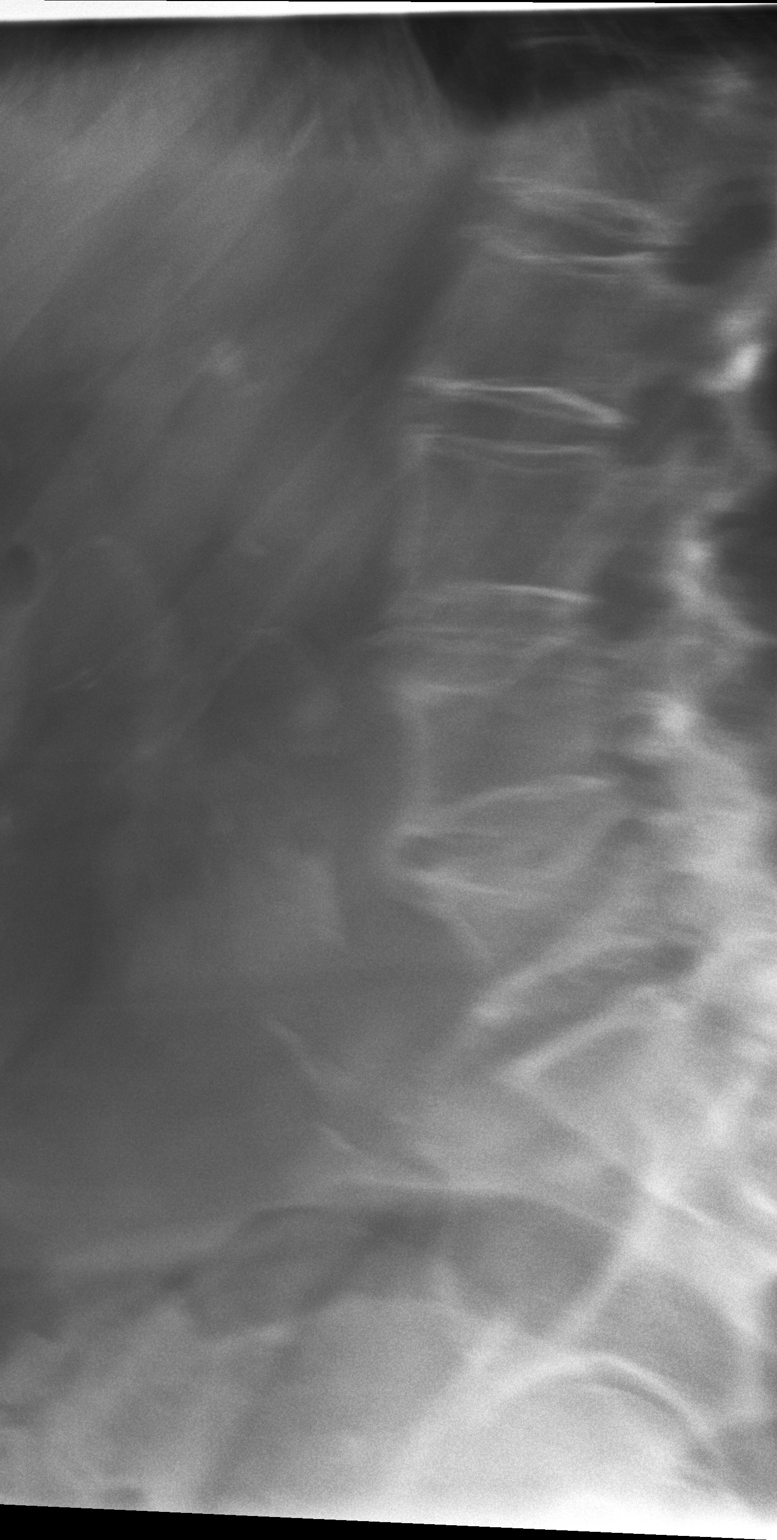

[3 of 3 positions shown; findings below may reference images not displayed]

FINDINGS: There are 5 non rib-bearing lumbar type vertebrae. A rudimentary
disc is present at S1-2. Trace anterolisthesis of L4 on L5 is
unchanged. Chronic L5 larger than L4 superior endplate Schmorl's
nodes are again noted. The patient's forearm obscures L1 on one of
the lateral radiographs, while the other lateral radiograph is
severely motion degraded. Within these limitations, no acute
fracture is identified. There is mild lumbar dextroscoliosis. Prior
bilateral inguinal hernia repair and aortic atherosclerosis are
noted. Degenerative endplate osteophyte formation is noted from
L3-L5 without significant disc space height loss.
IMPRESSION: Chronic changes without evidence of acute osseous abnormality.

## 2018-10-10 DIAGNOSIS — I130 Hypertensive heart and chronic kidney disease with heart failure and stage 1 through stage 4 chronic kidney disease, or unspecified chronic kidney disease: Secondary | ICD-10-CM | POA: Diagnosis not present

## 2018-10-11 ENCOUNTER — Other Ambulatory Visit: Payer: Self-pay | Admitting: Family Medicine

## 2018-10-11 DIAGNOSIS — R188 Other ascites: Secondary | ICD-10-CM

## 2018-10-14 NOTE — Telephone Encounter (Signed)
Gottschalk. NTBS 3 mos ckup was to be in April. Refill sent.

## 2018-10-15 ENCOUNTER — Telehealth: Payer: Self-pay | Admitting: Family Medicine

## 2018-10-29 ENCOUNTER — Telehealth: Payer: Self-pay | Admitting: Family Medicine

## 2018-11-11 ENCOUNTER — Other Ambulatory Visit: Payer: Self-pay | Admitting: Family Medicine

## 2018-11-12 ENCOUNTER — Other Ambulatory Visit: Payer: Self-pay | Admitting: Family Medicine

## 2018-11-12 DIAGNOSIS — R188 Other ascites: Secondary | ICD-10-CM

## 2018-11-15 ENCOUNTER — Other Ambulatory Visit: Payer: Self-pay

## 2018-11-15 ENCOUNTER — Ambulatory Visit (INDEPENDENT_AMBULATORY_CARE_PROVIDER_SITE_OTHER): Payer: Medicare HMO | Admitting: Family Medicine

## 2018-11-15 DIAGNOSIS — G301 Alzheimer's disease with late onset: Secondary | ICD-10-CM | POA: Diagnosis not present

## 2018-11-15 DIAGNOSIS — F0281 Dementia in other diseases classified elsewhere with behavioral disturbance: Secondary | ICD-10-CM | POA: Diagnosis not present

## 2018-11-15 MED ORDER — MIRTAZAPINE 7.5 MG PO TABS: 7.5000 mg | ORAL_TABLET | Freq: Every day | ORAL | 1 refills | Status: AC

## 2018-11-15 NOTE — Progress Notes (Signed)
Telephone visit  Subjective: CC: Alzheimer's dementia  PCP: Janora Norlander, DO OZH:YQMV H Kitchings is a 85 y.o. male calls for telephone consult today. Patient provides verbal consent for consult held via phone.  Location of patient: home Location of provider: Working remotely from home Others present for call: daughter, [name]  1.  Alzheimer's dementia Patient's daughter provides much of the history.  She notes that her father frequently wanders the house at nighttime.  He is on several occasions gotten out of the house despite the doors being pad locked and secured.  She notes sometimes he becomes combative wanting to go out the house and she does go ahead and allow him to step out onto the porch.  He often will go into the refrigerator and attempt to eat frozen foods.  She notes that he was previously on a sedative but that it expired.  She is wondering if there is something that we can add to help him sleep and reduce moodiness.   ROS: Per HPI  No Known Allergies Past Medical History:  Diagnosis Date  . Aortic insufficiency   . Aortic valve sclerosis   . Bronchitis   . CHF (congestive heart failure) ([place])   . Dementia ([place])   . Diabetes mellitus    Diet control   . Diverticulosis   . Dyslipidemia   . GERD (gastroesophageal reflux disease)   . Hard of hearing   . Hemorrhoids   . Hernia cerebri ([place])   . History of pneumonia   . Hypertension   . Hypothyroidism   . MI (myocardial infarction) ([place]) 1996   percutaneous coronary intervention   . Small bowel obstruction (HCC)     Current Outpatient Medications:  .  albuterol (PROVENTIL) (2.5 MG/3ML) 0.083% nebulizer solution, Take 3 mLs (2.5 mg total) by nebulization every 6 (six) hours as needed for wheezing or shortness of breath., Disp: 150 mL, Rfl: 3 .  aspirin 81 MG tablet, Take 81 mg by mouth daily., Disp: , Rfl:  .  Blood Glucose Monitoring Suppl (ACCU-CHEK AVIVA PLUS) w/Device KIT, USE  AS  INSTRUCTED, Disp: 1  kit, Rfl: 0 .  cholecalciferol (VITAMIN D) 1000 units tablet, Take 1,000 Units by mouth daily., Disp: , Rfl:  .  clotrimazole-betamethasone (LOTRISONE) cream, , Disp: , Rfl:  .  Diaper Rash Products (DESITIN EX), Apply 1 application topically daily., Disp: , Rfl:  .  docusate sodium (COLACE) 100 MG capsule, Take 1 capsule (100 mg total) by mouth at bedtime., Disp: 10 capsule, Rfl: 11 .  fluticasone (FLONASE) 50 MCG/ACT nasal spray, Place 1 spray into both nostrils 2 (two) times daily as needed for allergies or rhinitis., Disp: 16 g, Rfl: 6 .  furosemide (LASIX) 40 MG tablet, TAKE (2) TABLETS DAILY, Disp: 60 tablet, Rfl: 0 .  gabapentin (NEURONTIN) 300 MG capsule, Take 300 mg by mouth at bedtime., Disp: , Rfl:  .  glucose blood (ACCU-CHEK AVIVA) test strip, 1 each by Other route daily. Use as instructed, Disp: 100 each, Rfl: 3 .  JANUVIA 50 MG tablet, TAKE 1 TABLET DAILY, Disp: 90 tablet, Rfl: 0 .  Lancet Devices (ACCU-CHEK SOFTCLIX) lancets, 1 each by Other route daily., Disp: 1 each, Rfl: 3 .  levothyroxine (SYNTHROID, LEVOTHROID) 88 MCG tablet, Take 1 tablet (88 mcg total) by mouth daily., Disp: 90 tablet, Rfl: 1 .  meclizine (ANTIVERT) 25 MG tablet, Take 1 tablet (25 mg total) by mouth 3 (three) times daily as needed for dizziness., Disp: 30 tablet,  Rfl: 0 .  metoprolol succinate (TOPROL-XL) 25 MG 24 hr tablet, Take 1 tablet (25 mg total) by mouth daily., Disp: 90 tablet, Rfl: 3 .  Natural Senna Laxative 64.8-324 MG TABS, Take 1 tablet by mouth at bedtime. , Disp: , Rfl:  .  Omega-3 Fatty Acids (FISH OIL) 1000 MG CAPS, Take 1 capsule by mouth daily., Disp: , Rfl:  .  omeprazole (PRILOSEC) 20 MG capsule, TAKE (1) CAPSULE DAILY, Disp: 90 capsule, Rfl: 3 .  potassium chloride (K-DUR) 10 MEQ tablet, Take 1 tablet (10 mEq total) by mouth 2 (two) times daily. As a potassium supplement (Please make 6 mos ckup July), Disp: 60 tablet, Rfl: 0 .  simvastatin (ZOCOR) 20 MG tablet, TAKE 1 TABLET DAILY, Disp:  90 tablet, Rfl: 0  Assessment/ Plan: 85 y.o. male   1. Late onset Alzheimer's disease with behavioral disturbance ([place]) We will add mirtazapine 7.5 mg daily.  No renal adjustment needed.  She will follow-up in 4 weeks to let me know if this strength is working well if we need to increase to 15 mg daily.  Alternative therapies considered include Aricept which would help with some of the behavioral disturbances versus atypical antipsychotic (though given history of CAD would be less eager to add a medication in this category) - mirtazapine (REMERON) 7.5 MG tablet; Take 1 tablet (7.5 mg total) by mouth at bedtime.  Dispense: 30 tablet; Refill: 1   Start time: 8:56am End time: 9:08am  Total time spent on patient care (including telephone call/ virtual visit): 19 minutes  [place], [place] [phone]

## 2018-11-18 ENCOUNTER — Telehealth: Payer: Self-pay | Admitting: Family Medicine

## 2018-11-18 DIAGNOSIS — E119 Type 2 diabetes mellitus without complications: Secondary | ICD-10-CM | POA: Diagnosis not present

## 2018-11-18 DIAGNOSIS — G309 Alzheimer's disease, unspecified: Secondary | ICD-10-CM | POA: Diagnosis not present

## 2018-11-18 DIAGNOSIS — I700 Atherosclerosis of aorta: Secondary | ICD-10-CM | POA: Diagnosis not present

## 2018-11-18 DIAGNOSIS — I6501 Occlusion and stenosis of right vertebral artery: Secondary | ICD-10-CM | POA: Diagnosis not present

## 2018-11-18 DIAGNOSIS — Z1159 Encounter for screening for other viral diseases: Secondary | ICD-10-CM | POA: Diagnosis not present

## 2018-11-18 DIAGNOSIS — R29810 Facial weakness: Secondary | ICD-10-CM | POA: Diagnosis not present

## 2018-11-18 DIAGNOSIS — N390 Urinary tract infection, site not specified: Secondary | ICD-10-CM | POA: Diagnosis not present

## 2018-11-18 DIAGNOSIS — R404 Transient alteration of awareness: Secondary | ICD-10-CM | POA: Diagnosis not present

## 2018-11-18 DIAGNOSIS — I10 Essential (primary) hypertension: Secondary | ICD-10-CM | POA: Diagnosis not present

## 2018-11-18 DIAGNOSIS — G459 Transient cerebral ischemic attack, unspecified: Secondary | ICD-10-CM | POA: Diagnosis not present

## 2018-11-18 DIAGNOSIS — I2510 Atherosclerotic heart disease of native coronary artery without angina pectoris: Secondary | ICD-10-CM | POA: Diagnosis not present

## 2018-11-18 DIAGNOSIS — J9811 Atelectasis: Secondary | ICD-10-CM | POA: Diagnosis not present

## 2018-11-18 DIAGNOSIS — R9402 Abnormal brain scan: Secondary | ICD-10-CM | POA: Diagnosis not present

## 2018-11-18 DIAGNOSIS — B964 Proteus (mirabilis) (morganii) as the cause of diseases classified elsewhere: Secondary | ICD-10-CM | POA: Diagnosis not present

## 2018-11-18 DIAGNOSIS — F0281 Dementia in other diseases classified elsewhere with behavioral disturbance: Secondary | ICD-10-CM | POA: Diagnosis not present

## 2018-11-18 DIAGNOSIS — R262 Difficulty in walking, not elsewhere classified: Secondary | ICD-10-CM | POA: Diagnosis not present

## 2018-11-18 NOTE — Telephone Encounter (Signed)
Info was faxed to [phone].

## 2018-11-19 DIAGNOSIS — G309 Alzheimer's disease, unspecified: Secondary | ICD-10-CM | POA: Diagnosis not present

## 2018-11-19 DIAGNOSIS — E039 Hypothyroidism, unspecified: Secondary | ICD-10-CM | POA: Diagnosis not present

## 2018-11-19 DIAGNOSIS — I10 Essential (primary) hypertension: Secondary | ICD-10-CM | POA: Diagnosis not present

## 2018-11-19 DIAGNOSIS — E119 Type 2 diabetes mellitus without complications: Secondary | ICD-10-CM | POA: Diagnosis not present

## 2018-11-19 DIAGNOSIS — G459 Transient cerebral ischemic attack, unspecified: Secondary | ICD-10-CM | POA: Diagnosis not present

## 2018-11-20 DIAGNOSIS — G308 Other Alzheimer's disease: Secondary | ICD-10-CM | POA: Diagnosis not present

## 2018-11-20 DIAGNOSIS — I10 Essential (primary) hypertension: Secondary | ICD-10-CM | POA: Diagnosis not present

## 2018-11-20 DIAGNOSIS — E119 Type 2 diabetes mellitus without complications: Secondary | ICD-10-CM | POA: Diagnosis not present

## 2018-11-20 DIAGNOSIS — N390 Urinary tract infection, site not specified: Secondary | ICD-10-CM | POA: Diagnosis not present

## 2018-11-21 DIAGNOSIS — G309 Alzheimer's disease, unspecified: Secondary | ICD-10-CM | POA: Diagnosis not present

## 2018-11-21 DIAGNOSIS — I10 Essential (primary) hypertension: Secondary | ICD-10-CM | POA: Diagnosis not present

## 2018-11-21 DIAGNOSIS — N390 Urinary tract infection, site not specified: Secondary | ICD-10-CM | POA: Diagnosis not present

## 2018-11-21 DIAGNOSIS — E039 Hypothyroidism, unspecified: Secondary | ICD-10-CM | POA: Diagnosis not present

## 2018-11-21 DIAGNOSIS — E119 Type 2 diabetes mellitus without complications: Secondary | ICD-10-CM | POA: Diagnosis not present

## 2018-11-21 DIAGNOSIS — G459 Transient cerebral ischemic attack, unspecified: Secondary | ICD-10-CM | POA: Diagnosis not present

## 2018-11-24 DIAGNOSIS — F0391 Unspecified dementia with behavioral disturbance: Secondary | ICD-10-CM | POA: Diagnosis not present

## 2018-11-24 DIAGNOSIS — I129 Hypertensive chronic kidney disease with stage 1 through stage 4 chronic kidney disease, or unspecified chronic kidney disease: Secondary | ICD-10-CM | POA: Diagnosis not present

## 2018-11-24 DIAGNOSIS — E1122 Type 2 diabetes mellitus with diabetic chronic kidney disease: Secondary | ICD-10-CM | POA: Diagnosis not present

## 2018-11-24 DIAGNOSIS — N183 Chronic kidney disease, stage 3 (moderate): Secondary | ICD-10-CM | POA: Diagnosis not present

## 2018-11-24 DIAGNOSIS — G459 Transient cerebral ischemic attack, unspecified: Secondary | ICD-10-CM | POA: Diagnosis not present

## 2018-11-27 ENCOUNTER — Telehealth: Payer: Self-pay | Admitting: Family Medicine

## 2018-11-27 NOTE — Telephone Encounter (Signed)
[name] aware

## 2018-11-27 NOTE — Telephone Encounter (Signed)
As we discussed on the phone, this medication can take up to 4 weeks to have full effect.  We can certainly increase after he has been on for 4 full weeks if needed at that time.

## 2018-12-02 ENCOUNTER — Telehealth: Payer: Self-pay | Admitting: *Deleted

## 2018-12-02 ENCOUNTER — Telehealth: Payer: Self-pay | Admitting: Family Medicine

## 2018-12-02 NOTE — Telephone Encounter (Signed)
Spoke with pt's daughter and advised of provider feedback on how to taper Remeron but to wait till I call her back regarding the PA for seroquel. Pt's daughter voiced understanding but then states pt hasn't had BM since Friday and is c/o stomach hurting, took Sennakot last night and just did Enema on him today and still nothing. Please advise.

## 2018-12-02 NOTE — Telephone Encounter (Signed)
Ok to taper off Remeron and Take Seroquel instead.  Let's do the PA. Remeron: take 1 tablet every other day x1 week then stop.

## 2018-12-02 NOTE — Telephone Encounter (Signed)
Patient's daughter called stating that patient has been up all night talking, walking around without walker, messing in drawers and just not resting at night.  Daughter believes that remeron is not helping patient and would like something else for patient.  Advised daughter that it takes 4 weeks for medication to completely work.  Daughter states that she has seen no difference and does not think they can make it 4 weeks.

## 2018-12-02 NOTE — Telephone Encounter (Signed)
PA Case: [id number], Status: Approved, Coverage Starts on: 05/29/2018 12:00:00 AM, Coverage Ends on: 05/29/2019 12:00:00 AM. Questions? Contact [phone].

## 2018-12-02 NOTE — Telephone Encounter (Signed)
Prior Auth Request for Seroquel 25mg , take 2 PO QHS for agitation.  Rx'd at a hospital by Zollie Beckers on 11/24/18 Don't see it in our meds on active or history  Looks like you have him on Remeron at night, do you want to try and do PA or does he ntbs? Please advise.

## 2018-12-02 NOTE — Telephone Encounter (Signed)
Daughter aware.

## 2018-12-02 NOTE — Telephone Encounter (Signed)
He was prescribed Seroquel in the ED recently.  I am trying to get a prior authorization through insurance to replaced Remeron.

## 2018-12-02 NOTE — Telephone Encounter (Signed)
Please have her follow the directions below:  Start with Miralax sent to pharmacy. First dose 68g (4 capfuls) in 32oz water over 1 to 2 hours for clean out. Next day start 17g or 1 capful daily, may adjust dose up or down by half a capful every few days. Recommend to take this medicine daily for next 1-2 weeks, then may need to use it longer if needed. - Goal is to have soft regular bowel movement 1-3x daily, if too runny or diarrhea, then reduce dose of the medicine  Improve water intake, hydration will help Also recommend increased vegetables, fruits, fiber intake Can try daily Metamucil or Fiber supplement at pharmacy over the counter  Follow-up if symptoms are not improving with bowel movements, or if pain worsens, develop fevers, nausea, vomiting.  If you have any other questions or concerns, please feel free to call the clinic to contact me. You may also schedule an earlier appointment if necessary.  However, if your symptoms get significantly worse, please go to the Emergency Department to seek immediate medical attention.

## 2018-12-03 NOTE — Telephone Encounter (Signed)
Daughter states patient had a BM lastnight.  Advised recommendations and daughter states they have already been using Miralax.

## 2018-12-09 DIAGNOSIS — I130 Hypertensive heart and chronic kidney disease with heart failure and stage 1 through stage 4 chronic kidney disease, or unspecified chronic kidney disease: Secondary | ICD-10-CM | POA: Diagnosis not present

## 2018-12-17 ENCOUNTER — Other Ambulatory Visit: Payer: Self-pay | Admitting: Family Medicine

## 2018-12-17 DIAGNOSIS — R188 Other ascites: Secondary | ICD-10-CM

## 2018-12-17 NOTE — Telephone Encounter (Signed)
Gottschalk. NTBS for July appt 30 days given 11/12/18

## 2018-12-18 ENCOUNTER — Other Ambulatory Visit: Payer: Self-pay

## 2018-12-19 ENCOUNTER — Ambulatory Visit (INDEPENDENT_AMBULATORY_CARE_PROVIDER_SITE_OTHER): Payer: Medicare HMO | Admitting: Family Medicine

## 2018-12-19 ENCOUNTER — Other Ambulatory Visit: Payer: Self-pay | Admitting: Family Medicine

## 2018-12-19 ENCOUNTER — Encounter: Payer: Self-pay | Admitting: Family Medicine

## 2018-12-19 VITALS — BP 122/65 | HR 67 | Temp 98.0°F | Ht 65.0 in | Wt 179.0 lb

## 2018-12-19 DIAGNOSIS — G301 Alzheimer's disease with late onset: Secondary | ICD-10-CM | POA: Diagnosis not present

## 2018-12-19 DIAGNOSIS — I5032 Chronic diastolic (congestive) heart failure: Secondary | ICD-10-CM

## 2018-12-19 DIAGNOSIS — L219 Seborrheic dermatitis, unspecified: Secondary | ICD-10-CM | POA: Diagnosis not present

## 2018-12-19 DIAGNOSIS — E119 Type 2 diabetes mellitus without complications: Secondary | ICD-10-CM | POA: Diagnosis not present

## 2018-12-19 DIAGNOSIS — N183 Chronic kidney disease, stage 3 unspecified: Secondary | ICD-10-CM

## 2018-12-19 DIAGNOSIS — E039 Hypothyroidism, unspecified: Secondary | ICD-10-CM | POA: Diagnosis not present

## 2018-12-19 DIAGNOSIS — F0280 Dementia in other diseases classified elsewhere without behavioral disturbance: Secondary | ICD-10-CM | POA: Diagnosis not present

## 2018-12-19 LAB — BAYER DCA HB A1C WAIVED: HB A1C (BAYER DCA - WAIVED): 7.8 % — ABNORMAL HIGH (ref <7.0)

## 2018-12-19 MED ORDER — KETOCONAZOLE 2 % EX SHAM: 1.0000 "application " | MEDICATED_SHAMPOO | CUTANEOUS | 0 refills | Status: DC

## 2018-12-19 NOTE — Progress Notes (Signed)
Subjective: CC: rash PCP: Janora Norlander, DO XKG:YJEH H Debroux is a 85 y.o. male presenting to clinic today for:  1. Rash Patient is brought to the office by his daughter who notes that he has had a scaly rash along his forehead, scalp that is refractory to over-the-counter shampoos and treatments.  She is wondering if there is anything additional that we can do for this.  2.  Alzheimer's disease She reports that his Alzheimer's is stable but because she is having such stress at home with her husband being ill she is worried that there will be no one to care for her father.  She is considering placing him at [facility] in [place].  She is planning on bringing some paperwork by for me to complete.  She would love any assistance possible with facilitating this transfer.  Distantly, he never started the Seroquel although it was approved by the insurance.  He continues to have mood disturbances, particularly at nighttime.  She reports frequent pacing.   ROS: Per HPI  No Known Allergies Past Medical History:  Diagnosis Date  . Aortic insufficiency   . Aortic valve sclerosis   . Bronchitis   . CHF (congestive heart failure) ([place])   . Dementia ([place])   . Diabetes mellitus    Diet control   . Diverticulosis   . Dyslipidemia   . GERD (gastroesophageal reflux disease)   . Hard of hearing   . Hemorrhoids   . Hernia cerebri ([place])   . History of pneumonia   . Hypertension   . Hypothyroidism   . MI (myocardial infarction) ([place]) 1996   percutaneous coronary intervention   . Small bowel obstruction (HCC)     Current Outpatient Medications:  .  albuterol (PROVENTIL) (2.5 MG/3ML) 0.083% nebulizer solution, Take 3 mLs (2.5 mg total) by nebulization every 6 (six) hours as needed for wheezing or shortness of breath., Disp: 150 mL, Rfl: 3 .  aspirin 81 MG tablet, Take 81 mg by mouth daily., Disp: , Rfl:  .  Blood Glucose Monitoring Suppl (ACCU-CHEK AVIVA PLUS) w/Device KIT, USE  AS   INSTRUCTED, Disp: 1 kit, Rfl: 0 .  cholecalciferol (VITAMIN D) 1000 units tablet, Take 1,000 Units by mouth daily., Disp: , Rfl:  .  clotrimazole-betamethasone (LOTRISONE) cream, , Disp: , Rfl:  .  Diaper Rash Products (DESITIN EX), Apply 1 application topically daily., Disp: , Rfl:  .  docusate sodium (COLACE) 100 MG capsule, Take 1 capsule (100 mg total) by mouth at bedtime., Disp: 10 capsule, Rfl: 11 .  fluticasone (FLONASE) 50 MCG/ACT nasal spray, Place 1 spray into both nostrils 2 (two) times daily as needed for allergies or rhinitis., Disp: 16 g, Rfl: 6 .  furosemide (LASIX) 40 MG tablet, TAKE (2) TABLETS DAILY, Disp: 60 tablet, Rfl: 0 .  glucose blood (ACCU-CHEK AVIVA) test strip, 1 each by Other route daily. Use as instructed, Disp: 100 each, Rfl: 3 .  JANUVIA 50 MG tablet, TAKE 1 TABLET DAILY, Disp: 90 tablet, Rfl: 0 .  Lancet Devices (ACCU-CHEK SOFTCLIX) lancets, 1 each by Other route daily., Disp: 1 each, Rfl: 3 .  levothyroxine (SYNTHROID, LEVOTHROID) 88 MCG tablet, Take 1 tablet (88 mcg total) by mouth daily., Disp: 90 tablet, Rfl: 1 .  meclizine (ANTIVERT) 25 MG tablet, Take 1 tablet (25 mg total) by mouth 3 (three) times daily as needed for dizziness., Disp: 30 tablet, Rfl: 0 .  metoprolol succinate (TOPROL-XL) 25 MG 24 hr tablet, Take 1 tablet (  25 mg total) by mouth daily., Disp: 90 tablet, Rfl: 3 .  mirtazapine (REMERON) 7.5 MG tablet, Take 1 tablet (7.5 mg total) by mouth at bedtime., Disp: 30 tablet, Rfl: 1 .  Natural Senna Laxative 64.8-324 MG TABS, Take 1 tablet by mouth at bedtime. , Disp: , Rfl:  .  Omega-3 Fatty Acids (FISH OIL) 1000 MG CAPS, Take 1 capsule by mouth daily., Disp: , Rfl:  .  omeprazole (PRILOSEC) 20 MG capsule, TAKE (1) CAPSULE DAILY, Disp: 90 capsule, Rfl: 3 .  potassium chloride (K-DUR) 10 MEQ tablet, Take 1 tablet (10 mEq total) by mouth 2 (two) times daily. As a potassium supplement (Please make 6 mos ckup July), Disp: 60 tablet, Rfl: 0 .  simvastatin  (ZOCOR) 20 MG tablet, TAKE 1 TABLET DAILY, Disp: 90 tablet, Rfl: 0 .  gabapentin (NEURONTIN) 300 MG capsule, Take 300 mg by mouth at bedtime., Disp: , Rfl:  Social History   Socioeconomic History  . Marital status: Married    Spouse name: Not on file  . Number of children: Not on file  . Years of education: Not on file  . Highest education level: Not on file  Occupational History  . Occupation: Retired  Scientific laboratory technician  . Financial resource strain: Not on file  . Food insecurity    Worry: Not on file    Inability: Not on file  . Transportation needs    Medical: Not on file    Non-medical: Not on file  Tobacco Use  . Smoking status: Former Smoker    Quit date: 08/09/1942    Years since quitting: 76.4  . Smokeless tobacco: Never Used  Substance and Sexual Activity  . Alcohol use: No  . Drug use: No  . Sexual activity: Never  Lifestyle  . Physical activity    Days per week: Not on file    Minutes per session: Not on file  . Stress: Not on file  Relationships  . Social Herbalist on phone: Not on file    Gets together: Not on file    Attends religious service: Not on file    Active member of club or organization: Not on file    Attends meetings of clubs or organizations: Not on file    Relationship status: Not on file  . Intimate partner violence    Fear of current or ex partner: Not on file    Emotionally abused: Not on file    Physically abused: Not on file    Forced sexual activity: Not on file  Other Topics Concern  . Not on file  Social History Narrative  . Not on file   Family History  Problem Relation Age of Onset  . Colon cancer Neg Hx     Objective: Office vital signs reviewed. BP 122/65   Pulse 67   Temp 98 F (36.7 C) (Temporal)   Ht _0  (1.651 m)   Wt 179 lb (81.2 kg)   BMI 29.79 kg/m   Physical Examination:  General: Awake, alert, chronically ill appearing male, No acute distress HEENT: Normal; droopy right lower lid Cardio:  regular rate and rhythm, S1S2 heard, no murmurs appreciated Pulm: clear to auscultation bilaterally, no wheezes, rhonchi or rales; normal work of breathing on room air GI: protuberant MSK: arrives in wheelchair Neuro: does not interact with provider  Assessment/ Plan: 85 y.o. male   1. Late onset Alzheimer's disease without behavioral disturbance ([place]) Plan for placement.  I have  placed a referral to chronic care management to see if perhaps they might be of assistance facilitating paperwork and needs of transition to facility. - Referral to Chronic Care Management Services  2. Diabetes mellitus type II, non insulin dependent (HCC) Stable.  A1c is 7.8.  Again, would avoid excessive treatment of the diabetes in this debilitated, demented patient. - CMP14+EGFR - Bayer DCA Hb A1c Waived  3. Hypothyroidism, unspecified type Check thyroid panel. - Thyroid Panel With TSH  4. Chronic diastolic CHF (congestive heart failure) (HCC) No evidence of exacerbation - CMP14+EGFR  5. CKD (chronic kidney disease), stage III (HCC) Check CMP - CMP14+EGFR  6. Seborrheic dermatitis Nizoral prescribed.  Instructions for use discussed with the family member.  Follow-up PRN. - ketoconazole (NIZORAL) 2 % shampoo; Apply 1 application topically 2 (two) times a week. x2-3 weeks  Dispense: 120 mL; Refill: 0   Orders Placed This Encounter  Procedures  . CMP14+EGFR  . Thyroid Panel With TSH  . Bayer DCA Hb A1c Waived   No orders of the defined types were placed in this encounter.    Janora Norlander, DO [place] [phone]

## 2018-12-19 NOTE — Patient Instructions (Addendum)
You had labs performed today.  You will be contacted with the results of the labs once they are available, usually in the next 3 business days for routine lab work.  If you have an active my chart account, they will be released to your MyChart.  If you prefer to have these labs released to you via telephone, please let us know.  If you had a pap smear or biopsy performed, expect to be contacted in about 7-10 days.  Nicki Reaper will reach out to you to see if he can be of assistance with placement.  Feel free to drop any paperwork off and I will complete it  Seborrheic Dermatitis, Adult Seborrheic dermatitis is a skin disease that causes red, scaly patches. It usually occurs on the scalp, and it is often called dandruff. The patches may appear on other parts of the body. Skin patches tend to appear where there are many oil glands in the skin. Areas of the body that are commonly affected include:  Scalp.  Skin folds of the body.  Ears.  Eyebrows.  Neck.  Face.  Armpits.  The bearded area of men's faces. The condition may come and go for no known reason, and it is often long-lasting (chronic). What are the causes? The cause of this condition is not known. What increases the risk? This condition is more likely to develop in people who:  Have certain conditions, such as: ? HIV (human immunodeficiency virus). ? AIDS (acquired immunodeficiency syndrome). ? Parkinson disease. ? Mood disorders, such as depression.  Are 60-77 years old. What are the signs or symptoms? Symptoms of this condition include:  Thick scales on the scalp.  Redness on the face or in the armpits.  Skin that is flaky. The flakes may be white or yellow.  Skin that seems oily or dry but is not helped with moisturizers.  Itching or burning in the affected areas. How is this diagnosed? This condition is diagnosed with a medical history and physical exam. A sample of your skin may be tested (skin biopsy). You may  need to see a skin specialist (dermatologist). How is this treated? There is no cure for this condition, but treatment can help to manage the symptoms. You may get treatment to remove scales, lower the risk of skin infection, and reduce swelling or itching. Treatment may include:  Creams that reduce swelling and irritation (steroids).  Creams that reduce skin yeast.  Medicated shampoo, soaps, moisturizing creams, or ointments.  Medicated moisturizing creams or ointments. Follow these instructions at home:  Apply over-the-counter and prescription medicines only as told by your health care provider.  Use any medicated shampoo, soaps, skin creams, or ointments only as told by your health care provider.  Keep all follow-up visits as told by your health care provider. This is important. Contact a health care provider if:  Your symptoms do not improve with treatment.  Your symptoms get worse.  You have new symptoms. This information is not intended to replace advice given to you by your health care provider. Make sure you discuss any questions you have with your health care provider. Document Released: 05/15/2005 Document Revised: 04/27/2017 Document Reviewed: 09/02/2015 Elsevier Patient Education  2020 [employer].

## 2018-12-20 ENCOUNTER — Other Ambulatory Visit: Payer: Self-pay | Admitting: Family Medicine

## 2018-12-20 DIAGNOSIS — R188 Other ascites: Secondary | ICD-10-CM

## 2018-12-20 LAB — CMP14+EGFR
ALT: 12 IU/L (ref 0–44)
AST: 14 IU/L (ref 0–40)
Albumin/Globulin Ratio: 1.1 — ABNORMAL LOW (ref 1.2–2.2)
Albumin: 3.6 g/dL (ref 3.5–4.6)
Alkaline Phosphatase: 90 IU/L (ref 39–117)
BUN/Creatinine Ratio: 13 (ref 10–24)
BUN: 21 mg/dL (ref 10–36)
Bilirubin Total: 0.3 mg/dL (ref 0.0–1.2)
CO2: 27 mmol/L (ref 20–29)
Calcium: 9 mg/dL (ref 8.6–10.2)
Chloride: 95 mmol/L — ABNORMAL LOW (ref 96–106)
Creatinine, Ser: 1.58 mg/dL — ABNORMAL HIGH (ref 0.76–1.27)
GFR calc Af Amer: 44 mL/min/{1.73_m2} — ABNORMAL LOW (ref >59)
GFR calc non Af Amer: 38 mL/min/{1.73_m2} — ABNORMAL LOW (ref >59)
Globulin, Total: 3.2 g/dL (ref 1.5–4.5)
Glucose: 225 mg/dL — ABNORMAL HIGH (ref 65–99)
Potassium: 3.5 mmol/L (ref 3.5–5.2)
Sodium: 139 mmol/L (ref 134–144)
Total Protein: 6.8 g/dL (ref 6.0–8.5)

## 2018-12-20 LAB — THYROID PANEL WITH TSH
Free Thyroxine Index: 2.5 (ref 1.2–4.9)
T3 Uptake Ratio: 33 % (ref 24–39)
T4, Total: 7.5 ug/dL (ref 4.5–12.0)
TSH: 1.61 u[IU]/mL (ref 0.450–4.500)

## 2018-12-20 NOTE — Telephone Encounter (Signed)
Patient had appt on 12/19/18

## 2018-12-23 ENCOUNTER — Telehealth: Payer: Self-pay | Admitting: Family Medicine

## 2018-12-23 ENCOUNTER — Other Ambulatory Visit: Payer: Self-pay | Admitting: Physician Assistant

## 2018-12-23 MED ORDER — SIMVASTATIN 20 MG PO TABS: 20.0000 mg | ORAL_TABLET | Freq: Every day | ORAL | 0 refills | Status: DC

## 2018-12-23 NOTE — Telephone Encounter (Signed)
FL2 on Craig Nearing, PA-C desk for [employer]

## 2018-12-23 NOTE — Telephone Encounter (Signed)
Aware refill sent & labs need to be done

## 2018-12-23 NOTE — Telephone Encounter (Signed)
1 prescription for simvastatin was sent to [facility]

## 2018-12-24 ENCOUNTER — Other Ambulatory Visit: Payer: Medicare HMO

## 2018-12-24 ENCOUNTER — Other Ambulatory Visit: Payer: Self-pay

## 2018-12-24 DIAGNOSIS — Z20822 Contact with and (suspected) exposure to covid-19: Secondary | ICD-10-CM

## 2018-12-24 DIAGNOSIS — R6889 Other general symptoms and signs: Secondary | ICD-10-CM | POA: Diagnosis not present

## 2018-12-25 ENCOUNTER — Telehealth: Payer: Self-pay | Admitting: Family Medicine

## 2018-12-25 DIAGNOSIS — R188 Other ascites: Secondary | ICD-10-CM

## 2018-12-25 MED ORDER — POTASSIUM CHLORIDE ER 10 MEQ PO TBCR: 10.0000 meq | EXTENDED_RELEASE_TABLET | Freq: Two times a day (BID) | ORAL | 0 refills | Status: DC

## 2018-12-25 MED ORDER — FUROSEMIDE 40 MG PO TABS: 40.0000 mg | ORAL_TABLET | Freq: Two times a day (BID) | ORAL | 0 refills | Status: DC

## 2018-12-25 NOTE — Telephone Encounter (Signed)
What is the name of the medication? potassium chloride (K-DUR) 10 MEQ tablet furosemide (LASIX) 40 MG tablet   Have you contacted your pharmacy to request a refill? No pt has apt with Rosilyn Mings 12/19/2018  Which pharmacy would you like this sent to? [facility]    Patient notified that their request is being sent to the clinical staff for review and that they should receive a call once it is complete. If they do not receive a call within 24 hours they can check with their pharmacy or our office.

## 2018-12-26 LAB — NOVEL CORONAVIRUS, NAA: SARS-CoV-2, NAA: NOT DETECTED

## 2018-12-26 MED ORDER — POTASSIUM CHLORIDE ER 10 MEQ PO TBCR: 10.0000 meq | EXTENDED_RELEASE_TABLET | Freq: Two times a day (BID) | ORAL | 2 refills | Status: AC

## 2018-12-26 NOTE — Telephone Encounter (Signed)
FL@ faxed to [facility] @ [phone]

## 2018-12-26 NOTE — Addendum Note (Signed)
Addended by: Antonietta Barcelona D on: 12/26/2018 11:12 AM   Modules accepted: Orders

## 2018-12-27 ENCOUNTER — Ambulatory Visit: Payer: Self-pay | Admitting: Licensed Clinical Social Worker

## 2018-12-27 DIAGNOSIS — N183 Chronic kidney disease, stage 3 unspecified: Secondary | ICD-10-CM

## 2018-12-27 DIAGNOSIS — I10 Essential (primary) hypertension: Secondary | ICD-10-CM

## 2018-12-27 DIAGNOSIS — I5032 Chronic diastolic (congestive) heart failure: Secondary | ICD-10-CM

## 2018-12-27 DIAGNOSIS — F0280 Dementia in other diseases classified elsewhere without behavioral disturbance: Secondary | ICD-10-CM

## 2018-12-27 DIAGNOSIS — E119 Type 2 diabetes mellitus without complications: Secondary | ICD-10-CM

## 2018-12-27 DIAGNOSIS — E039 Hypothyroidism, unspecified: Secondary | ICD-10-CM

## 2018-12-27 NOTE — Chronic Care Management (AMB) (Signed)
  Care Management Note   [name] is a 85 y.o. year old male who is a primary care patient of Janora Norlander, DO. The CM team was consulted for assistance with chronic disease management and care coordination.   I reached out to [place] daughter [name]  by phone today.   Mr. Guyton Derek Jack [name] were given information about Chronic Care Management services today including:  1. CCM service includes personalized support from designated clinical staff supervised by his physician, including individualized plan of care and coordination with other care providers 2. 24/7 contact phone numbers for assistance for urgent and routine care needs. 3. Service will only be billed when office clinical staff spend 20 minutes or more in a month to coordinate care. 4. Only one practitioner may furnish and bill the service in a calendar month. 5. The patient may stop CCM services at any time (effective at the end of the month) by phone call to the office staff. 6. The patient will be responsible for cost sharing (co-pay) of up to 20% of the service fee (after annual deductible is met). Patient /daughter [name] did not agree to services and wish to consider information provided before deciding about enrollment in care management services.    Review of patient status, including review of consultants reports, relevant laboratory and other test results, and collaboration with appropriate care team members and the patient's provider was performed as part of comprehensive patient evaluation and provision of chronic care management services.   LCSW spoke via phone with [name], daughter of client on 12/27/2018. [name] said that FL-2 had been sent to [facility] in [place], [place]. She said she had talked with representative at [facility]. She said she is hoping that client may be admitted to [facility] in [place], [place] within the next week. She said also that client is on  the waiting list for [facility] in [place], [place].  LCSW talked with [name] about CCM program services in nursing and social work.  Follow Up Plan: LCSW to call client/daughter [name] in next 2 weeks to talk with client or daughter, [name], about CCM program services and placement status of client  [name].Jerson Furukawa MSW, LCSW Licensed Clinical Social Worker [facility] Care Management [phone]

## 2018-12-27 NOTE — Patient Instructions (Addendum)
Licensed Clinical Social Worker Visit Information  Materials provided: No  [name] /daughter [name] were given information about Chronic Care Management services today including:  1. CCM service includes personalized support from designated clinical staff supervised by his physician, including individualized plan of care and coordination with other care providers 2. 24/7 contact phone numbers for assistance for urgent and routine care needs. 3. Service will only be billed when office clinical staff spend 20 minutes or more in a month to coordinate care. 4. Only one practitioner may furnish and bill the service in a calendar month. 5. The patient may stop CCM services at any time (effective at the end of the month) by phone call to the office staff. 6. The patient will be responsible for cost sharing (co-pay) of up to 20% of the service fee (after annual deductible is met).  Patient/daughter [name] did not agree to services and wish to consider information provided before deciding about enrollment in care management services.   LCSW spoke via phone with [name], daughter of client on 12/27/2018. [name] said that FL-2 had been sent to [facility] in [place], [place]. She said she had talked with representative at [facility]. She said she is hoping that client may be admitted to [facility] in [place], [place] within the next week. She said also that client is on the waiting list for [facility] in [place], [place].  LCSW talked with [name] about CCM program services in nursing and social work.  Follow up plan: LCSW to call client/daughter, [name],in next 2 weeks to talk  further with client or daughter, [name] about CCM program services  The patient /daughter [name] verbalized understanding of instructions provided today and declined a print copy of patient instruction materials.   [name].Narek Kniss MSW, LCSW Licensed Clinical Social  Worker [facility] [phone]

## 2019-01-10 ENCOUNTER — Ambulatory Visit: Payer: Self-pay | Admitting: Licensed Clinical Social Worker

## 2019-01-10 DIAGNOSIS — F0280 Dementia in other diseases classified elsewhere without behavioral disturbance: Secondary | ICD-10-CM

## 2019-01-10 DIAGNOSIS — G301 Alzheimer's disease with late onset: Secondary | ICD-10-CM

## 2019-01-10 DIAGNOSIS — E039 Hypothyroidism, unspecified: Secondary | ICD-10-CM

## 2019-01-10 DIAGNOSIS — N183 Chronic kidney disease, stage 3 unspecified: Secondary | ICD-10-CM

## 2019-01-10 DIAGNOSIS — E119 Type 2 diabetes mellitus without complications: Secondary | ICD-10-CM

## 2019-01-10 DIAGNOSIS — I5032 Chronic diastolic (congestive) heart failure: Secondary | ICD-10-CM

## 2019-01-10 DIAGNOSIS — I10 Essential (primary) hypertension: Secondary | ICD-10-CM

## 2019-01-10 NOTE — Chronic Care Management (AMB) (Signed)
  Care Management Note   JABRE HEO is a 85 y.o. year old male who is a primary care patient of Janora Norlander, DO. The CM team was consulted for assistance with chronic disease management and care coordination.   I reached out to [name] /daughter [name] by phone today.   Mr. Frede/daughter Peggoy Fernande Boyden were given information about Chronic Care Management services today including:  1. CCM service includes personalized support from designated clinical staff supervised by his physician, including individualized plan of care and coordination with other care providers 2. 24/7 contact phone numbers for assistance for urgent and routine care needs. 3. Service will only be billed when office clinical staff spend 20 minutes or more in a month to coordinate care. 4. Only one practitioner may furnish and bill the service in a calendar month. 5. The patient may stop CCM services at any time (effective at the end of the month) by phone call to the office staff. 6. The patient will be responsible for cost sharing (co-pay) of up to 20% of the service fee (after annual deductible is met). Patient/daughter [name]  did not agree to services and wish to consider information provided before deciding about enrollment in care management services.    Review of patient status, including review of consultants reports, relevant laboratory and other test results, and collaboration with appropriate care team members and the patient's provider was performed as part of comprehensive patient evaluation and provision of chronic care management services.   LCSW talked with [name], daughter of client, today via phone. [name] stated at present client has not been able to be placed at a care facility. Client is on the waiting list at [facility] facility.  LCSW talked with [name] about Dementia Care Unit support. She is hoping client will be accepted soon at [facility]. She said she plans to call [facility] facility next week to talk further with facility representative about admission availability at facility  Follow Up Plan:   LCSW to call client/daughter in next 3 weeks to assess client placement status at that time.  [name].Waldon Sheerin MSW, LCSW Licensed Clinical Social Worker [facility] [phone]

## 2019-01-10 NOTE — Patient Instructions (Signed)
Licensed Clinical Social Worker Visit Information  Materials provided: No  Mr. Bhola /daughter [name] were given information about Chronic Care Management services today including:  1. CCM service includes personalized support from designated clinical staff supervised by his physician, including individualized plan of care and coordination with other care providers 2. 24/7 contact phone numbers for assistance for urgent and routine care needs. 3. Service will only be billed when office clinical staff spend 20 minutes or more in a month to coordinate care. 4. Only one practitioner may furnish and bill the service in a calendar month. 5. The patient may stop CCM services at any time (effective at the end of the month) by phone call to the office staff. 6. The patient will be responsible for cost sharing (co-pay) of up to 20% of the service fee (after annual deductible is met).   Patient/daughter [name] did not agree to services and wish to consider information provided before deciding about enrollment in care management services.    Follow Up Plan: LCSW to call client/daughter in next 3 weeks to talk with client or daughter about client placement status at that time  The patient/daughter [name] verbalized understanding of instructions provided today and declined a print copy of patient instruction materials.   [name].Mackinze Criado MSW, LCSW Licensed Clinical Social Worker [facility] [phone]

## 2019-01-13 ENCOUNTER — Other Ambulatory Visit: Payer: Self-pay | Admitting: Family Medicine

## 2019-01-13 DIAGNOSIS — R188 Other ascites: Secondary | ICD-10-CM

## 2019-01-28 ENCOUNTER — Other Ambulatory Visit: Payer: Self-pay

## 2019-01-28 ENCOUNTER — Emergency Department (HOSPITAL_COMMUNITY): Payer: Medicare HMO

## 2019-01-28 ENCOUNTER — Emergency Department (HOSPITAL_COMMUNITY)
Admission: EM | Admit: 2019-01-28 | Discharge: 2019-01-28 | Disposition: A | Discharge status: Home / Self Care | Payer: Medicare HMO | Attending: Emergency Medicine | Admitting: Emergency Medicine

## 2019-01-28 DIAGNOSIS — S0083XA Contusion of other part of head, initial encounter: Secondary | ICD-10-CM | POA: Diagnosis not present

## 2019-01-28 DIAGNOSIS — Z79899 Other long term (current) drug therapy: Secondary | ICD-10-CM | POA: Insufficient documentation

## 2019-01-28 DIAGNOSIS — Z87891 Personal history of nicotine dependence: Secondary | ICD-10-CM | POA: Diagnosis not present

## 2019-01-28 DIAGNOSIS — R4182 Altered mental status, unspecified: Secondary | ICD-10-CM | POA: Diagnosis not present

## 2019-01-28 DIAGNOSIS — I110 Hypertensive heart disease with heart failure: Secondary | ICD-10-CM | POA: Diagnosis not present

## 2019-01-28 DIAGNOSIS — I5032 Chronic diastolic (congestive) heart failure: Secondary | ICD-10-CM | POA: Insufficient documentation

## 2019-01-28 DIAGNOSIS — F0280 Dementia in other diseases classified elsewhere without behavioral disturbance: Secondary | ICD-10-CM | POA: Diagnosis not present

## 2019-01-28 DIAGNOSIS — Y92003 Bedroom of unspecified non-institutional (private) residence as the place of occurrence of the external cause: Secondary | ICD-10-CM | POA: Diagnosis not present

## 2019-01-28 DIAGNOSIS — G309 Alzheimer's disease, unspecified: Secondary | ICD-10-CM | POA: Diagnosis not present

## 2019-01-28 DIAGNOSIS — Z7982 Long term (current) use of aspirin: Secondary | ICD-10-CM | POA: Diagnosis not present

## 2019-01-28 DIAGNOSIS — Y939 Activity, unspecified: Secondary | ICD-10-CM | POA: Insufficient documentation

## 2019-01-28 DIAGNOSIS — M542 Cervicalgia: Secondary | ICD-10-CM | POA: Diagnosis not present

## 2019-01-28 DIAGNOSIS — E1142 Type 2 diabetes mellitus with diabetic polyneuropathy: Secondary | ICD-10-CM | POA: Diagnosis not present

## 2019-01-28 DIAGNOSIS — Z7984 Long term (current) use of oral hypoglycemic drugs: Secondary | ICD-10-CM | POA: Diagnosis not present

## 2019-01-28 DIAGNOSIS — W19XXXA Unspecified fall, initial encounter: Secondary | ICD-10-CM | POA: Diagnosis not present

## 2019-01-28 DIAGNOSIS — U071 COVID-19: Secondary | ICD-10-CM | POA: Diagnosis not present

## 2019-01-28 DIAGNOSIS — Y999 Unspecified external cause status: Secondary | ICD-10-CM | POA: Diagnosis not present

## 2019-01-28 DIAGNOSIS — S199XXA Unspecified injury of neck, initial encounter: Secondary | ICD-10-CM | POA: Diagnosis not present

## 2019-01-28 DIAGNOSIS — R51 Headache: Secondary | ICD-10-CM | POA: Diagnosis not present

## 2019-01-28 DIAGNOSIS — S0990XA Unspecified injury of head, initial encounter: Secondary | ICD-10-CM | POA: Diagnosis present

## 2019-01-28 DIAGNOSIS — S0093XA Contusion of unspecified part of head, initial encounter: Secondary | ICD-10-CM | POA: Insufficient documentation

## 2019-01-28 DIAGNOSIS — W01190A Fall on same level from slipping, tripping and stumbling with subsequent striking against furniture, initial encounter: Secondary | ICD-10-CM | POA: Diagnosis not present

## 2019-01-28 DIAGNOSIS — R404 Transient alteration of awareness: Secondary | ICD-10-CM | POA: Diagnosis not present

## 2019-01-28 NOTE — ED Notes (Signed)
c-collar removed

## 2019-01-28 NOTE — ED Provider Notes (Signed)
[facility] EMERGENCY DEPARTMENT Provider Note   CSN: [id number] Arrival date & time: 01/28/19  1638     History   Chief Complaint Chief Complaint  Patient presents with   Fall    HPI [name] is a 85 y.o. male.  Level 5 caveat secondary to dementia.  85 year old male lives at home reportedly had a fall when he did not have a shoe on all the way and fell back and hit his head on the chest in the bedroom.  Patient himself denies any complaints but is not reliable for historian.     The history is provided by the EMS personnel. The history is limited by the absence of a caregiver.  Fall This is a new problem. The current episode started 1 to 2 hours ago. The problem has not changed since onset.   Past Medical History:  Diagnosis Date   Aortic insufficiency    Aortic valve sclerosis    Bronchitis    CHF (congestive heart failure) ([place])    Dementia (HCC)    Diabetes mellitus    Diet control    Diverticulosis    Dyslipidemia    GERD (gastroesophageal reflux disease)    Hard of hearing    Hemorrhoids    Hernia cerebri ([place])    History of pneumonia    Hypertension    Hypothyroidism    MI (myocardial infarction) ([place]) 1996   percutaneous coronary intervention    Small bowel obstruction [facility])     Patient Active Problem List   Diagnosis Date Noted   Pressure injury of skin 02/18/2017   Generalized weakness 02/16/2017   Vertigo 02/13/2017   CKD (chronic kidney disease), stage III ([place]) 02/13/2017   Chronic diastolic CHF (congestive heart failure) ([place]) 02/13/2017   Orthostasis 02/13/2017   Eczema 08/31/2015   Ascites 08/31/2015   Right hip pain 08/10/2015   Diabetic polyneuropathy associated with type 2 diabetes mellitus ([place]) 08/10/2015   Alzheimer's dementia ([place]) 08/10/2015   Hypothyroid 12/04/2012   Chronic back pain 12/04/2012   Hard of hearing 09/03/2012   Aortic insufficiency 09/03/2012   Vitamin D deficiency  09/03/2012   Arthritis 09/03/2012   Insomnia 09/03/2012   Iron deficiency anemia, unspecified 07/31/2011   Diabetes mellitus type II, non insulin dependent ([place]) 11/30/2009   Hyperlipidemia 11/30/2009   Essential hypertension 11/30/2009   CORONARY ATHEROSCLEROSIS NATIVE CORONARY ARTERY 11/30/2009    Past Surgical History:  Procedure Laterality Date   ABDOMINAL HERNIA REPAIR     multiple sx's   CATARACT EXTRACTION     left eye   CHOLECYSTECTOMY     COLONOSCOPY  2008   EXPLORATORY LAPAROTOMY W/ BOWEL RESECTION     ventral hernia repair   EYE SURGERY Bilateral    TRANSURETHRAL RESECTION OF PROSTATE          Home Medications    Prior to Admission medications   Medication Sig Start Date End Date Taking? Authorizing Provider  albuterol (PROVENTIL) (2.5 MG/3ML) 0.083% nebulizer solution Take 3 mLs (2.5 mg total) by nebulization every 6 (six) hours as needed for wheezing or shortness of breath. 07/02/14   Chipper Herb, MD  aspirin 81 MG tablet Take 81 mg by mouth daily.    [provider]  Blood Glucose Monitoring Suppl (ACCU-CHEK AVIVA PLUS) w/Device KIT USE  AS  INSTRUCTED 07/05/17   Timmothy Euler, MD  cholecalciferol (VITAMIN D) 1000 units tablet Take 1,000 Units by mouth daily.    [provider]  clotrimazole-betamethasone (LOTRISONE) cream  04/26/17   [provider]  Diaper Rash Products (DESITIN EX) Apply 1 application topically daily.    [provider]  docusate sodium (COLACE) 100 MG capsule Take 1 capsule (100 mg total) by mouth at bedtime. 11/01/15   Claretta Fraise, MD  fluticasone (FLONASE) 50 MCG/ACT nasal spray Place 1 spray into both nostrils 2 (two) times daily as needed for allergies or rhinitis. 02/26/18   Janora Norlander, DO  furosemide (LASIX) 40 MG tablet Take 1 tablet (40 mg total) by mouth 2 (two) times daily. 01/14/19   Janora Norlander, DO  gabapentin (NEURONTIN) 300 MG capsule Take 300 mg by mouth at  bedtime. 08/27/18   [provider]  glucose blood (ACCU-CHEK AVIVA) test strip 1 each by Other route daily. Use as instructed 04/23/17   Timmothy Euler, MD  JANUVIA 50 MG tablet TAKE 1 TABLET DAILY 12/20/18   Ronnie Doss M, DO  ketoconazole (NIZORAL) 2 % shampoo Apply 1 application topically 2 (two) times a week. x2-3 weeks 12/19/18   Janora Norlander, DO  Lancet Devices (ACCU-CHEK [place]) lancets 1 each by Other route daily. 04/23/17   Timmothy Euler, MD  levothyroxine (SYNTHROID, LEVOTHROID) 88 MCG tablet Take 1 tablet (88 mcg total) by mouth daily. 08/27/18   Janora Norlander, DO  meclizine (ANTIVERT) 25 MG tablet Take 1 tablet (25 mg total) by mouth 3 (three) times daily as needed for dizziness. 07/19/16   Terald Sleeper, PA-C  metoprolol succinate (TOPROL-XL) 25 MG 24 hr tablet Take 1 tablet (25 mg total) by mouth daily. 02/26/18   Janora Norlander, DO  mirtazapine (REMERON) 7.5 MG tablet Take 1 tablet (7.5 mg total) by mouth at bedtime. 11/15/18   Janora Norlander, DO  Natural Senna Laxative 64.8-324 MG TABS Take 1 tablet by mouth at bedtime.     [provider]  Omega-3 Fatty Acids (FISH OIL) 1000 MG CAPS Take 1 capsule by mouth daily.    [provider]  omeprazole (PRILOSEC) 20 MG capsule TAKE (1) CAPSULE DAILY 02/26/18   Ronnie Doss M, DO  potassium chloride (K-DUR) 10 MEQ tablet Take 1 tablet (10 mEq total) by mouth 2 (two) times daily. As a potassium supplement 12/26/18   Ronnie Doss M, DO  simvastatin (ZOCOR) 20 MG tablet Take 1 tablet (20 mg total) by mouth daily. 12/23/18   Terald Sleeper, PA-C    Family History Family History  Problem Relation Age of Onset   Colon cancer Neg Hx     Social History Social History   Tobacco Use   Smoking status: Former Smoker    Quit date: 08/09/1942    Years since quitting: 76.5   Smokeless tobacco: Never Used  Substance Use Topics   Alcohol use: No   Drug use: No      Allergies   Patient has no known allergies.   Review of Systems Review of Systems  Unable to perform ROS: Dementia     Physical Exam Updated Vital Signs BP 126/70 (BP Location: Left Arm)    Pulse 88    Resp 18    SpO2 97%   Physical Exam Vitals signs and nursing note reviewed.  Constitutional:      Appearance: He is well-developed.  HENT:     Head: Normocephalic and atraumatic.  Eyes:     Conjunctiva/sclera: Conjunctivae normal.  Neck:     Musculoskeletal: Neck supple.  Cardiovascular:  Rate and Rhythm: Normal rate and regular rhythm.     Heart sounds: No murmur.  Pulmonary:     Effort: Pulmonary effort is normal. No respiratory distress.     Breath sounds: Normal breath sounds.  Abdominal:     Palpations: Abdomen is soft.     Tenderness: There is no abdominal tenderness.  Skin:    General: Skin is warm and dry.  Neurological:     General: No focal deficit present.     Mental Status: He is alert. Mental status is at baseline.     Comments: Patient not really following commands.  He is alert.  He will answer some simple questions but not very reliable.      ED Treatments / Results  Labs (all labs ordered are listed, but only abnormal results are displayed) Labs Reviewed - No data to display  EKG None  Radiology Ct Head Wo Contrast  Result Date: 01/28/2019 CLINICAL DATA:  Acute pain due to trauma. Hematoma to the back of the head. EXAM: CT HEAD WITHOUT CONTRAST CT CERVICAL SPINE WITHOUT CONTRAST TECHNIQUE: Multidetector CT imaging of the head and cervical spine was performed following the standard protocol without intravenous contrast. Multiplanar CT image reconstructions of the cervical spine were also generated. COMPARISON:  November 18, 2018 FINDINGS: CT HEAD FINDINGS Brain: No evidence of acute infarction, hemorrhage, hydrocephalus, extra-axial collection or mass lesion/mass effect. There is significant atrophy and chronic microvascular ischemic changes.  Vascular: No hyperdense vessel or unexpected calcification. Skull: Normal. Negative for fracture or focal lesion. Sinuses/Orbits: No acute finding. There is mild mucosal thickening of the right maxillary sinus. Other: None. CT CERVICAL SPINE FINDINGS Alignment: Normal. Skull base and vertebrae: No acute fracture. No primary bone lesion or focal pathologic process. Soft tissues and spinal canal: No prevertebral fluid or swelling. No visible canal hematoma. Disc levels: There is multilevel disc height loss throughout the cervical spine. Upper chest: Negative. Other: None IMPRESSION: 1. No acute intracranial abnormality. 2. No acute cervical spine fracture. 3. Age related atrophy again noted. Electronically Signed   By: Constance Holster M.D.   On: 01/28/2019 17:53   Ct Cervical Spine Wo Contrast  Result Date: 01/28/2019 CLINICAL DATA:  Acute pain due to trauma. Hematoma to the back of the head. EXAM: CT HEAD WITHOUT CONTRAST CT CERVICAL SPINE WITHOUT CONTRAST TECHNIQUE: Multidetector CT imaging of the head and cervical spine was performed following the standard protocol without intravenous contrast. Multiplanar CT image reconstructions of the cervical spine were also generated. COMPARISON:  November 18, 2018 FINDINGS: CT HEAD FINDINGS Brain: No evidence of acute infarction, hemorrhage, hydrocephalus, extra-axial collection or mass lesion/mass effect. There is significant atrophy and chronic microvascular ischemic changes. Vascular: No hyperdense vessel or unexpected calcification. Skull: Normal. Negative for fracture or focal lesion. Sinuses/Orbits: No acute finding. There is mild mucosal thickening of the right maxillary sinus. Other: None. CT CERVICAL SPINE FINDINGS Alignment: Normal. Skull base and vertebrae: No acute fracture. No primary bone lesion or focal pathologic process. Soft tissues and spinal canal: No prevertebral fluid or swelling. No visible canal hematoma. Disc levels: There is multilevel disc height  loss throughout the cervical spine. Upper chest: Negative. Other: None IMPRESSION: 1. No acute intracranial abnormality. 2. No acute cervical spine fracture. 3. Age related atrophy again noted. Electronically Signed   By: Constance Holster M.D.   On: 01/28/2019 17:53    Procedures Procedures (including critical care time)  Medications Ordered in ED Medications - No data to display  Initial Impression / Assessment and Plan / ED Course  I have reviewed the triage vital signs and the nursing notes.  Pertinent labs & imaging results that were available during my care of the patient were reviewed by me and considered in my medical decision making (see chart for details).  Clinical Course as of Jan 28 1001  Tue Jan 28, 2019  1756 Patient here after a fall at home.  He has a little bit of a hematoma on the back of his head.  He is in a c-collar from EMS.  He is moving all extremities.  He had a CT head and cervical spine which shows some atrophy but no acute findings.  Will remove c-collar and attempt to find out if he is at his baseline.   [MB]  [id number] Daughter here now and states patient is at his baseline.  She is ready to take him home.   [MB]    Clinical Course User Index [MB] Hayden Rasmussen, MD        Final Clinical Impressions(s) / ED Diagnoses   Final diagnoses:  Fall, initial encounter  Contusion of head, unspecified part of head, initial encounter    ED Discharge Orders    None       Hayden Rasmussen, MD 01/29/19 1003

## 2019-01-28 NOTE — Discharge Instructions (Signed)
You were seen in the emergency department for evaluation of a head injury after a fall.  You had a CAT scan of your head and cervical spine that did not show any acute findings.  You should use ice to the affected areas and can take Tylenol for pain as needed.  Please follow-up with your doctor and return to the emergency department if any worsening symptoms.

## 2019-01-28 NOTE — ED Triage Notes (Signed)
Pt fell at home, slipped  And did not have one shoe on all way and fell and hit back of head on chest in bedroom.  Pt with hx dementia.  Hematoma noted to back of head. c-collar in place PTA.

## 2019-02-04 ENCOUNTER — Telehealth: Payer: Self-pay | Admitting: Family Medicine

## 2019-02-04 ENCOUNTER — Ambulatory Visit: Payer: Self-pay | Admitting: Licensed Clinical Social Worker

## 2019-02-04 DIAGNOSIS — G301 Alzheimer's disease with late onset: Secondary | ICD-10-CM

## 2019-02-04 DIAGNOSIS — F0280 Dementia in other diseases classified elsewhere without behavioral disturbance: Secondary | ICD-10-CM

## 2019-02-04 DIAGNOSIS — I10 Essential (primary) hypertension: Secondary | ICD-10-CM

## 2019-02-04 DIAGNOSIS — E039 Hypothyroidism, unspecified: Secondary | ICD-10-CM

## 2019-02-04 DIAGNOSIS — E119 Type 2 diabetes mellitus without complications: Secondary | ICD-10-CM

## 2019-02-04 DIAGNOSIS — I5032 Chronic diastolic (congestive) heart failure: Secondary | ICD-10-CM

## 2019-02-04 DIAGNOSIS — N183 Chronic kidney disease, stage 3 unspecified: Secondary | ICD-10-CM

## 2019-02-04 NOTE — Chronic Care Management (AMB) (Signed)
  Care Management Note   [name] is a 85 y.o. year old male who is a primary care patient of Janora Norlander, DO. The CM team was consulted for assistance with chronic disease management and care coordination.   I reached out to Desiree Lucy Senk/daughter, [name] by phone today.   Mr. Whilden Derek Jack [name] were given information about Chronic Care Management services today including:  1. CCM service includes personalized support from designated clinical staff supervised by his physician, including individualized plan of care and coordination with other care providers 2. 24/7 contact phone numbers for assistance for urgent and routine care needs. 3. Service will only be billed when office clinical staff spend 20 minutes or more in a month to coordinate care. 4. Only one practitioner may furnish and bill the service in a calendar month. 5. The patient may stop CCM services at any time (effective at the end of the month) by phone call to the office staff. 6. The patient will be responsible for cost sharing (co-pay) of up to 20% of the service fee (after annual deductible is met). Patient / daughter, [name] did not agree to services and wish to consider information provided before deciding about enrollment in care management services.    Review of patient status, including review of consultants reports, relevant laboratory and other test results, and collaboration with appropriate care team members and the patient's provider was performed as part of comprehensive patient evaluation and provision of chronic care management services.   LCSW spoke via phone with [name], daughter of client today. She said that Ace Bergfeld is on the waiting list at [facility] facility. [name] plans to talk with Medicaid caseworker in [place] soon to discuss Medicaid  benefits for client.  She said that facility has open room available at this time. So, she plans to  call Medicaid caseworker tomorrow to discuss Medicaid benefits for client  Follow Up Plan: LCSW to call Adarsh Swift/[name] Fernande Boyden in next 3 weeks to assess placement status of Mr. Gorman Safi.Geza Beranek MSW, LCSW Licensed Clinical Social Worker [facility] [phone]

## 2019-02-04 NOTE — Patient Instructions (Addendum)
Licensed Clinical Social Worker Visit Information  Materials Provided: No   [name] /daughter, [name] were given information about Chronic Care Management services today including:  1. CCM service includes personalized support from designated clinical staff supervised by his physician, including individualized plan of care and coordination with other care providers 2. 24/7 contact phone numbers for assistance for urgent and routine care needs. 3. Service will only be billed when office clinical staff spend 20 minutes or more in a month to coordinate care. 4. Only one practitioner may furnish and bill the service in a calendar month. 5. The patient may stop CCM services at any time (effective at the end of the month) by phone call to the office staff. 6. The patient will be responsible for cost sharing (co-pay) of up to 20% of the service fee (after annual deductible is met).  Patient /daughter [name] did not agree to services and wish to consider information provided before deciding about enrollment in care management services.   LCSW spoke via phone with [name], daughter of client today. She said that [name] is on the waiting list at [facility] facility. Craig plans to talk with Medicaid caseworker in [place] soon to discuss Medicaid  benefits for client.  She said that facility has open room available at this time. So, she plans to call Medicaid caseworker tomorrow to discuss Medicaid benefits for client  Follow Up Plan: LCSW to call [name]/[name] in next 3 weeks to assess placement status of [name]  The patient/daughter [name] verbalized understanding of instructions provided today and declined a print copy of patient instruction materials.    S. MSW, LCSW Licensed Clinical Social Worker [facility] [phone]  

## 2019-02-04 NOTE — Telephone Encounter (Signed)
Form filled out and placed on providers desk 

## 2019-02-05 ENCOUNTER — Ambulatory Visit (INDEPENDENT_AMBULATORY_CARE_PROVIDER_SITE_OTHER): Payer: Medicare HMO | Admitting: *Deleted

## 2019-02-05 ENCOUNTER — Other Ambulatory Visit: Payer: Self-pay

## 2019-02-05 DIAGNOSIS — Z022 Encounter for examination for admission to residential institution: Secondary | ICD-10-CM | POA: Diagnosis not present

## 2019-02-05 DIAGNOSIS — Z23 Encounter for immunization: Secondary | ICD-10-CM

## 2019-02-05 NOTE — Telephone Encounter (Signed)
Aware form ready to be picked up

## 2019-02-05 NOTE — Progress Notes (Signed)
PPD placed R forearm Pt tolerated well

## 2019-02-07 LAB — TB SKIN TEST
Induration: 0 mm
TB Skin Test: NEGATIVE

## 2019-02-09 DIAGNOSIS — I130 Hypertensive heart and chronic kidney disease with heart failure and stage 1 through stage 4 chronic kidney disease, or unspecified chronic kidney disease: Secondary | ICD-10-CM | POA: Diagnosis not present

## 2019-02-27 ENCOUNTER — Ambulatory Visit: Payer: Self-pay | Admitting: Licensed Clinical Social Worker

## 2019-02-27 DIAGNOSIS — I10 Essential (primary) hypertension: Secondary | ICD-10-CM

## 2019-02-27 DIAGNOSIS — I5032 Chronic diastolic (congestive) heart failure: Secondary | ICD-10-CM

## 2019-02-27 DIAGNOSIS — E119 Type 2 diabetes mellitus without complications: Secondary | ICD-10-CM

## 2019-02-27 DIAGNOSIS — F0280 Dementia in other diseases classified elsewhere without behavioral disturbance: Secondary | ICD-10-CM

## 2019-02-27 NOTE — Patient Instructions (Addendum)
Licensed Clinical Social Worker Visit Information  Materials Provided: No  I reached out to [employer] [name], daughter by phone today.   Mr. [name] were given information about Chronic Care Management services today including:  1. CCM service includes personalized support from designated clinical staff supervised by his physician, including individualized plan of care and coordination with other care providers 2. 24/7 contact phone numbers for assistance for urgent and routine care needs. 3. Service will only be billed when office clinical staff spend 20 minutes or more in a month to coordinate care. 4. Only one practitioner may furnish and bill the service in a calendar month. 5. The patient may stop CCM services at any time (effective at the end of the month) by phone call to the office staff. 6. The patient will be responsible for cost sharing (co-pay) of up to 20% of the service fee (after annual deductible is met). [name], daughter of patient did not agree to services and wish to consider information provided before deciding about enrollment in care management services.    LCSW talked with [name], daughter of patient via phone on 02/27/2019. Craig said client had recent fall but was doing well now since his fall. She said she had gone to [place] in [place], [place] to apply for long term care Medicaid for patient. She said she was now waiting for a bed opening for client at [facility] facility. She did not consent to CCM program at present and is considering CCM program services to determine if she wants to enroll patient or not in coming weeks. She agreed for LCSW to call her in 3 weeks to talk more with her about CCM program services.  Follow Up Plan: LCSW to call [name], daughter of client in next 3 weeks to talk with client or [name] further about CCM program services.  [name], daughter, verbalized understanding of instructions provided today and declined a print copy of patient instruction materials.   [name].[name] MSW, LCSW Licensed Clinical Social Worker [facility] [phone]

## 2019-02-27 NOTE — Chronic Care Management (AMB) (Signed)
Care Management Note   [name] Nichols is a 85 y.o. year old male who is a primary care patient of [name] Norlander, DO. The CM team was consulted for assistance with chronic disease management and care coordination.   I reached out to [name] Nichols [name] Nichols, daughter by phone today.   [name] Nichols/[name] Nichols were given information about Chronic Care Management services today including:  1. CCM service includes personalized support from designated clinical staff supervised by his physician, including individualized plan of care and coordination with other care providers 2. 24/7 contact phone numbers for assistance for urgent and routine care needs. 3. Service will only be billed when office clinical staff spend 20 minutes or more in a month to coordinate care. 4. Only one practitioner may furnish and bill the service in a calendar month. 5. The patient may stop CCM services at any time (effective at the end of the month) by phone call to the office staff. 6. The patient will be responsible for cost sharing (co-pay) of up to 20% of the service fee (after annual deductible is met). [name] Nichols, daughter of patient did not agree to services and wish to consider information provided before deciding about enrollment in care management services.    Review of patient status, including review of consultants reports, relevant laboratory and other test results, and collaboration with appropriate care team members and the patient's provider was performed as part of comprehensive patient evaluation and provision of chronic care management services.   Medications   New medications from outside sources are available for reconciliation   albuterol (PROVENTIL) (2.5 MG/3ML) 0.083% nebulizer solution    aspirin 81 MG tablet    Blood Glucose Monitoring Suppl (ACCU-CHEK AVIVA PLUS) w/Device KIT    cholecalciferol (VITAMIN D) 1000 units tablet    clotrimazole-betamethasone (LOTRISONE) cream     Diaper Rash Products (DESITIN EX)    docusate sodium (COLACE) 100 MG capsule    fluticasone (FLONASE) 50 MCG/ACT nasal spray    furosemide (LASIX) 40 MG tablet    gabapentin (NEURONTIN) 300 MG capsule    glucose blood (ACCU-CHEK AVIVA) test strip    JANUVIA 50 MG tablet    ketoconazole (NIZORAL) 2 % shampoo    Lancet Devices (ACCU-CHEK SOFTCLIX) lancets    levothyroxine (SYNTHROID, LEVOTHROID) 88 MCG tablet    meclizine (ANTIVERT) 25 MG tablet    metoprolol succinate (TOPROL-XL) 25 MG 24 hr tablet    mirtazapine (REMERON) 7.5 MG tablet    Natural Senna Laxative 64.8-324 MG TABS    Omega-3 Fatty Acids (FISH OIL) 1000 MG CAPS    omeprazole (PRILOSEC) 20 MG capsule    potassium chloride (K-DUR) 10 MEQ tablet    simvastatin (ZOCOR) 20 MG tablet     LCSW talked with [name] Nichols, daughter of patient via phone on 02/27/2019. [name] said client had recent fall but was doing well now since his fall. She said she had gone to [place] in [place], [place] to apply for long term care Medicaid for patient. She said she was now waiting for a bed opening for client at [facility] facility. She did not consent to CCM program at present and is considering CCM program services to determine if she wants to enroll patient or not in coming weeks. She agreed for LCSW to call her in 3 weeks to talk more with her about CCM program services.  Follow Up Plan: LCSW to call [name] Nichols, daughter of  client in next 3 weeks to talk with client or [name] Nichols further about CCM program services.  [name] Nichols.[name] Nichols MSW, LCSW Licensed Clinical Social Worker [facility] [phone]

## 2019-03-04 ENCOUNTER — Other Ambulatory Visit: Payer: Self-pay | Admitting: Family Medicine

## 2019-03-04 ENCOUNTER — Other Ambulatory Visit: Payer: Self-pay | Admitting: Physician Assistant

## 2019-03-13 ENCOUNTER — Ambulatory Visit (INDEPENDENT_AMBULATORY_CARE_PROVIDER_SITE_OTHER): Payer: Medicare HMO | Admitting: Family Medicine

## 2019-03-13 ENCOUNTER — Other Ambulatory Visit: Payer: Self-pay

## 2019-03-13 ENCOUNTER — Encounter: Payer: Self-pay | Admitting: Family Medicine

## 2019-03-13 DIAGNOSIS — R1310 Dysphagia, unspecified: Secondary | ICD-10-CM | POA: Diagnosis not present

## 2019-03-13 DIAGNOSIS — R1319 Other dysphagia: Secondary | ICD-10-CM

## 2019-03-13 NOTE — Progress Notes (Signed)
Virtual Visit via telephone Note Due to COVID-19 pandemic this visit was conducted virtually. This visit type was conducted due to national recommendations for restrictions regarding the COVID-19 Pandemic (e.g. social distancing, sheltering in place) in an effort to limit this patient's exposure and mitigate transmission in our community. All issues noted in this document were discussed and addressed.  A physical exam was not performed with this format.   I connected with [name] on 03/13/19 at 1600 by telephone and verified that I am speaking with the correct person using two identifiers. [name] is currently located at home and family is currently with them during visit. The provider, Monia Pouch, FNP is located in their office at time of visit.  I discussed the limitations, risks, security and privacy concerns of performing an evaluation and management service by telephone and the availability of in person appointments. I also discussed with the patient that there may be a patient responsible charge related to this service. The patient expressed understanding and agreed to proceed.  Subjective:  Patient ID: [name], male    DOB: [birth year], 85 y.o.   MRN: [id number]  Chief Complaint:  Trouble swallowing food   HPI: [name] is a 85 y.o. male presenting on 03/13/2019 for Trouble swallowing food   Family states pt has been having increased difficulty with eating. States it seems as if he is having a difficult time swallowing his food. Family denies pt getting choked on his food or vomiting. States he has been eating less due to difficulty with feeding. Pt has Alzheimer's Disease and family states his symptoms have progressed over the last several months.     Relevant past medical, surgical, family, and social history reviewed and updated as indicated.  Allergies and medications reviewed and updated.   Past Medical History:  Diagnosis Date  . Aortic  insufficiency   . Aortic valve sclerosis   . Bronchitis   . CHF (congestive heart failure) ([place])   . Dementia ([place])   . Diabetes mellitus    Diet control   . Diverticulosis   . Dyslipidemia   . GERD (gastroesophageal reflux disease)   . Hard of hearing   . Hemorrhoids   . Hernia cerebri ([place])   . History of pneumonia   . Hypertension   . Hypothyroidism   . MI (myocardial infarction) ([place]) 1996   percutaneous coronary intervention   . Small bowel obstruction [facility])     Past Surgical History:  Procedure Laterality Date  . ABDOMINAL HERNIA REPAIR     multiple sx's  . CATARACT EXTRACTION     left eye  . CHOLECYSTECTOMY    . COLONOSCOPY  2008  . EXPLORATORY LAPAROTOMY W/ BOWEL RESECTION     ventral hernia repair  . EYE SURGERY Bilateral   . TRANSURETHRAL RESECTION OF PROSTATE      Social History   Socioeconomic History  . Marital status: Married    Spouse name: Not on file  . Number of children: Not on file  . Years of education: Not on file  . Highest education level: Not on file  Occupational History  . Occupation: Retired  Scientific laboratory technician  . Financial resource strain: Not on file  . Food insecurity    Worry: Not on file    Inability: Not on file  . Transportation needs    Medical: Not on file    Non-medical: Not on file  Tobacco Use  . Smoking status: Former  Smoker    Quit date: 08/09/1942    Years since quitting: 76.6  . Smokeless tobacco: Never Used  Substance and Sexual Activity  . Alcohol use: No  . Drug use: No  . Sexual activity: Never  Lifestyle  . Physical activity    Days per week: Not on file    Minutes per session: Not on file  . Stress: Not on file  Relationships  . Social Herbalist on phone: Not on file    Gets together: Not on file    Attends religious service: Not on file    Active member of club or organization: Not on file    Attends meetings of clubs or organizations: Not on file    Relationship status: Not on file  .  Intimate partner violence    Fear of current or ex partner: Not on file    Emotionally abused: Not on file    Physically abused: Not on file    Forced sexual activity: Not on file  Other Topics Concern  . Not on file  Social History Narrative  . Not on file    Outpatient Encounter Medications as of 03/13/2019  Medication Sig  . albuterol (PROVENTIL) (2.5 MG/3ML) 0.083% nebulizer solution Take 3 mLs (2.5 mg total) by nebulization every 6 (six) hours as needed for wheezing or shortness of breath.  Marland Kitchen aspirin 81 MG tablet Take 81 mg by mouth daily.  . Blood Glucose Monitoring Suppl (ACCU-CHEK AVIVA PLUS) w/Device KIT USE  AS  INSTRUCTED  . cholecalciferol (VITAMIN D) 1000 units tablet Take 1,000 Units by mouth daily.  . clotrimazole-betamethasone (LOTRISONE) cream   . Diaper Rash Products (DESITIN EX) Apply 1 application topically daily.  Marland Kitchen docusate sodium (COLACE) 100 MG capsule Take 1 capsule (100 mg total) by mouth at bedtime.  . fluticasone (FLONASE) 50 MCG/ACT nasal spray Place 1 spray into both nostrils 2 (two) times daily as needed for allergies or rhinitis.  . furosemide (LASIX) 40 MG tablet Take 1 tablet (40 mg total) by mouth 2 (two) times daily.  Marland Kitchen gabapentin (NEURONTIN) 300 MG capsule Take 300 mg by mouth at bedtime.  Marland Kitchen glucose blood (ACCU-CHEK AVIVA) test strip 1 each by Other route daily. Use as instructed  . JANUVIA 50 MG tablet TAKE 1 TABLET DAILY  . ketoconazole (NIZORAL) 2 % shampoo Apply 1 application topically 2 (two) times a week. x2-3 weeks  . Lancet Devices (ACCU-CHEK SOFTCLIX) lancets 1 each by Other route daily.  Marland Kitchen levothyroxine (SYNTHROID) 88 MCG tablet TAKE 1 TABLET DAILY  . meclizine (ANTIVERT) 25 MG tablet Take 1 tablet (25 mg total) by mouth 3 (three) times daily as needed for dizziness.  . metoprolol succinate (TOPROL-XL) 25 MG 24 hr tablet TAKE 1 TABLET DAILY  . mirtazapine (REMERON) 7.5 MG tablet Take 1 tablet (7.5 mg total) by mouth at bedtime.  Zack Seal Laxative 64.8-324 MG TABS Take 1 tablet by mouth at bedtime.   . Omega-3 Fatty Acids (FISH OIL) 1000 MG CAPS Take 1 capsule by mouth daily.  Marland Kitchen omeprazole (PRILOSEC) 20 MG capsule TAKE (1) CAPSULE DAILY  . potassium chloride (K-DUR) 10 MEQ tablet Take 1 tablet (10 mEq total) by mouth 2 (two) times daily. As a potassium supplement  . simvastatin (ZOCOR) 20 MG tablet TAKE 1 TABLET EVERY DAY   No facility-administered encounter medications on file as of 03/13/2019.     No Known Allergies  Review of Systems  Unable to perform  ROS: Dementia (ROS per daughter)  Constitutional: Negative for chills, fever and unexpected weight change.  HENT: Positive for trouble swallowing. Negative for drooling.   Respiratory: Negative for cough and choking.   Gastrointestinal: Negative for diarrhea and vomiting.  Genitourinary: Negative for decreased urine volume.  Neurological: Negative for syncope.  Psychiatric/Behavioral: Positive for confusion (baseline).         Observations/Objective: No vital signs or physical exam, this was a telephone or virtual health encounter.  Pt alert and oriented, answers all questions appropriately, and able to speak in full sentences.    Assessment and Plan: Mahki was seen today for trouble swallowing food.  Diagnoses and all orders for this visit:  Esophageal dysphagia Reported trouble swallowing food per family. Will refer to GI for evaluation per family's request. Family aware of symptoms that require emergent evaluation.  -     Ambulatory referral to Gastroenterology     Follow Up Instructions: Return if symptoms worsen or fail to improve.    I discussed the assessment and treatment plan with the patient. The patient was provided an opportunity to ask questions and all were answered. The patient agreed with the plan and demonstrated an understanding of the instructions.   The patient was advised to call back or seek an in-person evaluation if the  symptoms worsen or if the condition fails to improve as anticipated.  The above assessment and management plan was discussed with the patient. The patient verbalized understanding of and has agreed to the management plan. Patient is aware to call the clinic if they develop any new symptoms or if symptoms persist or worsen. Patient is aware when to return to the clinic for a follow-up visit. Patient educated on when it is appropriate to go to the emergency department.    I provided 15 minutes of non-face-to-face time during this encounter. The call started at 1600. The call ended at 1615. The other time was used for coordination of care.    Monia Pouch, FNP-C [facility] [address] [place], [place] [address] [phone] 03/13/19

## 2019-03-18 ENCOUNTER — Other Ambulatory Visit: Payer: Self-pay

## 2019-03-18 ENCOUNTER — Other Ambulatory Visit: Payer: Self-pay | Admitting: Family Medicine

## 2019-03-18 DIAGNOSIS — R188 Other ascites: Secondary | ICD-10-CM

## 2019-03-18 DIAGNOSIS — Z20822 Contact with and (suspected) exposure to covid-19: Secondary | ICD-10-CM

## 2019-03-19 ENCOUNTER — Ambulatory Visit (INDEPENDENT_AMBULATORY_CARE_PROVIDER_SITE_OTHER): Payer: Medicare HMO

## 2019-03-19 DIAGNOSIS — Z23 Encounter for immunization: Secondary | ICD-10-CM | POA: Diagnosis not present

## 2019-03-19 LAB — NOVEL CORONAVIRUS, NAA: SARS-CoV-2, NAA: NOT DETECTED

## 2019-03-20 ENCOUNTER — Telehealth: Payer: Self-pay

## 2019-04-02 ENCOUNTER — Telehealth: Payer: Self-pay

## 2019-04-07 ENCOUNTER — Ambulatory Visit: Payer: Self-pay | Admitting: Licensed Clinical Social Worker

## 2019-04-07 DIAGNOSIS — E119 Type 2 diabetes mellitus without complications: Secondary | ICD-10-CM

## 2019-04-07 DIAGNOSIS — F0280 Dementia in other diseases classified elsewhere without behavioral disturbance: Secondary | ICD-10-CM

## 2019-04-07 DIAGNOSIS — I10 Essential (primary) hypertension: Secondary | ICD-10-CM

## 2019-04-07 DIAGNOSIS — E039 Hypothyroidism, unspecified: Secondary | ICD-10-CM

## 2019-04-07 NOTE — Chronic Care Management (AMB) (Signed)
  Care Management Note   ILLIAS PANTANO is a 85 y.o. year old male who is a primary care patient of Janora Norlander, DO. The CM team was consulted for assistance with chronic disease management and care coordination.   I reached out to [employer], daughter,  by phone today.   Mr. Arps [name], daughter were given information about Chronic Care Management services today including:  1. CCM service includes personalized support from designated clinical staff supervised by his physician, including individualized plan of care and coordination with other care providers 2. 24/7 contact phone numbers for assistance for urgent and routine care needs. 3. Service will only be billed when office clinical staff spend 20 minutes or more in a month to coordinate care. 4. Only one practitioner may furnish and bill the service in a calendar month. 5. The patient may stop CCM services at any time (effective at the end of the month) by phone call to the office staff. 6. The patient will be responsible for cost sharing (co-pay) of up to 20% of the service fee (after annual deductible is met). Patient/[name] Fernande Boyden, daughter,  did not agree to enrollment in care management services and do  not wish to consider at this time.   Review of patient status, including review of consultants reports, relevant laboratory and other test results, and collaboration with appropriate care team members and the patient's provider was performed as part of comprehensive patient evaluation and provision of chronic care management services.   Medications   New medications from outside sources are available for reconciliation   albuterol (PROVENTIL) (2.5 MG/3ML) 0.083% nebulizer solution    aspirin 81 MG tablet    Blood Glucose Monitoring Suppl (ACCU-CHEK AVIVA PLUS) w/Device KIT    cholecalciferol (VITAMIN D) 1000 units tablet    clotrimazole-betamethasone (LOTRISONE) cream    Diaper Rash Products  (DESITIN EX)    docusate sodium (COLACE) 100 MG capsule    fluticasone (FLONASE) 50 MCG/ACT nasal spray    furosemide (LASIX) 40 MG tablet    gabapentin (NEURONTIN) 300 MG capsule    glucose blood (ACCU-CHEK AVIVA) test strip    JANUVIA 50 MG tablet    ketoconazole (NIZORAL) 2 % shampoo    Lancet Devices (ACCU-CHEK SOFTCLIX) lancets    levothyroxine (SYNTHROID) 88 MCG tablet    meclizine (ANTIVERT) 25 MG tablet    metoprolol succinate (TOPROL-XL) 25 MG 24 hr tablet    mirtazapine (REMERON) 7.5 MG tablet    Natural Senna Laxative 64.8-324 MG TABS    Omega-3 Fatty Acids (FISH OIL) 1000 MG CAPS    omeprazole (PRILOSEC) 20 MG capsule    potassium chloride (K-DUR) 10 MEQ tablet    simvastatin (ZOCOR) 20 MG tablet      [name], daughter of client, reported to LCSW today that client had been admitted for care to [facility] facility. [name] is glad client is now receiving care at that facility.  [name] and LCSW have talked about CCM program services for client; but , [name] declines CCM program support services for client.  LCSW encouraged [name] to call LCSW if needed in future months if she has further interest in CCM program services  [name].Mikeisha Lemonds MSW, LCSW Licensed Clinical Social Worker [facility] [phone]

## 2019-04-07 NOTE — Patient Instructions (Addendum)
Licensed Clinical Social Worker Visit Information   Materials Provided: No  I reached out to [employer], daughter,  by phone today.   Mr. [name], daughter were given information about Chronic Care Management services today including:  1. CCM service includes personalized support from designated clinical staff supervised by his physician, including individualized plan of care and coordination with other care providers 2. 24/7 contact phone numbers for assistance for urgent and routine care needs. 3. Service will only be billed when office clinical staff spend 20 minutes or more in a month to coordinate care. 4. Only one practitioner may furnish and bill the service in a calendar month. 5. The patient may stop CCM services at any time (effective at the end of the month) by phone call to the office staff. 6. The patient will be responsible for cost sharing (co-pay) of up to 20% of the service fee (after annual deductible is met). [name], daughter,  did not agree to enrollment in care management services and do  not wish to consider at this time.   [name], daughter of client, reported to LCSW today that client had been admitted for care to [facility] facility. [name] is glad client is now receiving care at that facility.  Craig and LCSW have talked about CCM program services for client; but , [name] declines CCM program support services for client.  LCSW encouraged Craig to call LCSW if needed in future months if she has further interest in CCM program services  [name], daughter, verbalized understanding of instructions provided today and declined a print copy of patient instruction materials.   [name].Ival Basquez MSW, LCSW Licensed Clinical Social Worker [facility] [phone]

## 2019-04-22 ENCOUNTER — Ambulatory Visit (INDEPENDENT_AMBULATORY_CARE_PROVIDER_SITE_OTHER): Payer: Medicare HMO

## 2019-04-22 ENCOUNTER — Ambulatory Visit: Payer: Medicare HMO | Admitting: Internal Medicine

## 2019-04-22 ENCOUNTER — Other Ambulatory Visit: Payer: Self-pay

## 2019-04-22 DIAGNOSIS — Z6832 Body mass index (BMI) 32.0-32.9, adult: Secondary | ICD-10-CM

## 2019-04-22 DIAGNOSIS — E1122 Type 2 diabetes mellitus with diabetic chronic kidney disease: Secondary | ICD-10-CM

## 2019-04-22 DIAGNOSIS — I130 Hypertensive heart and chronic kidney disease with heart failure and stage 1 through stage 4 chronic kidney disease, or unspecified chronic kidney disease: Secondary | ICD-10-CM

## 2019-04-22 DIAGNOSIS — N183 Chronic kidney disease, stage 3 unspecified: Secondary | ICD-10-CM | POA: Diagnosis not present

## 2019-04-22 DIAGNOSIS — I5032 Chronic diastolic (congestive) heart failure: Secondary | ICD-10-CM

## 2019-05-01 DIAGNOSIS — H02102 Unspecified ectropion of right lower eyelid: Secondary | ICD-10-CM | POA: Diagnosis not present

## 2019-05-01 DIAGNOSIS — H16141 Punctate keratitis, right eye: Secondary | ICD-10-CM | POA: Diagnosis not present

## 2019-05-01 DIAGNOSIS — H01002 Unspecified blepharitis right lower eyelid: Secondary | ICD-10-CM | POA: Diagnosis not present

## 2019-05-12 DIAGNOSIS — U071 COVID-19: Secondary | ICD-10-CM | POA: Diagnosis not present

## 2019-05-12 DIAGNOSIS — Z20828 Contact with and (suspected) exposure to other viral communicable diseases: Secondary | ICD-10-CM | POA: Diagnosis not present

## 2019-05-15 ENCOUNTER — Telehealth: Payer: Self-pay | Admitting: Family Medicine

## 2019-05-15 NOTE — Telephone Encounter (Signed)
Daughter aware can not use nebulizer in the nursing home.

## 2019-05-19 ENCOUNTER — Other Ambulatory Visit: Payer: Self-pay

## 2019-05-19 ENCOUNTER — Inpatient Hospital Stay (HOSPITAL_COMMUNITY)
Admission: EM | Admit: 2019-05-19 | Discharge: 2019-05-30 | DRG: 871 | Disposition: E | Payer: Medicare HMO | Attending: Internal Medicine | Admitting: Internal Medicine

## 2019-05-19 ENCOUNTER — Encounter (HOSPITAL_COMMUNITY): Payer: Self-pay

## 2019-05-19 ENCOUNTER — Emergency Department (HOSPITAL_COMMUNITY): Payer: Medicare HMO

## 2019-05-19 DIAGNOSIS — J9811 Atelectasis: Secondary | ICD-10-CM | POA: Diagnosis not present

## 2019-05-19 DIAGNOSIS — N1832 Chronic kidney disease, stage 3b: Secondary | ICD-10-CM | POA: Diagnosis present

## 2019-05-19 DIAGNOSIS — N390 Urinary tract infection, site not specified: Secondary | ICD-10-CM | POA: Diagnosis present

## 2019-05-19 DIAGNOSIS — M549 Dorsalgia, unspecified: Secondary | ICD-10-CM | POA: Diagnosis present

## 2019-05-19 DIAGNOSIS — E870 Hyperosmolality and hypernatremia: Secondary | ICD-10-CM | POA: Diagnosis present

## 2019-05-19 DIAGNOSIS — N179 Acute kidney failure, unspecified: Secondary | ICD-10-CM | POA: Diagnosis present

## 2019-05-19 DIAGNOSIS — K5790 Diverticulosis of intestine, part unspecified, without perforation or abscess without bleeding: Secondary | ICD-10-CM | POA: Diagnosis present

## 2019-05-19 DIAGNOSIS — Z515 Encounter for palliative care: Secondary | ICD-10-CM | POA: Diagnosis not present

## 2019-05-19 DIAGNOSIS — E876 Hypokalemia: Secondary | ICD-10-CM | POA: Diagnosis not present

## 2019-05-19 DIAGNOSIS — L89311 Pressure ulcer of right buttock, stage 1: Secondary | ICD-10-CM | POA: Diagnosis present

## 2019-05-19 DIAGNOSIS — L89321 Pressure ulcer of left buttock, stage 1: Secondary | ICD-10-CM | POA: Diagnosis present

## 2019-05-19 DIAGNOSIS — Z87891 Personal history of nicotine dependence: Secondary | ICD-10-CM

## 2019-05-19 DIAGNOSIS — I5032 Chronic diastolic (congestive) heart failure: Secondary | ICD-10-CM | POA: Diagnosis present

## 2019-05-19 DIAGNOSIS — H9190 Unspecified hearing loss, unspecified ear: Secondary | ICD-10-CM | POA: Diagnosis present

## 2019-05-19 DIAGNOSIS — R6520 Severe sepsis without septic shock: Secondary | ICD-10-CM | POA: Diagnosis present

## 2019-05-19 DIAGNOSIS — U071 COVID-19: Secondary | ICD-10-CM | POA: Diagnosis present

## 2019-05-19 DIAGNOSIS — F0280 Dementia in other diseases classified elsewhere without behavioral disturbance: Secondary | ICD-10-CM | POA: Diagnosis present

## 2019-05-19 DIAGNOSIS — E1165 Type 2 diabetes mellitus with hyperglycemia: Secondary | ICD-10-CM | POA: Diagnosis present

## 2019-05-19 DIAGNOSIS — G8929 Other chronic pain: Secondary | ICD-10-CM | POA: Diagnosis present

## 2019-05-19 DIAGNOSIS — Z9861 Coronary angioplasty status: Secondary | ICD-10-CM

## 2019-05-19 DIAGNOSIS — Z7984 Long term (current) use of oral hypoglycemic drugs: Secondary | ICD-10-CM

## 2019-05-19 DIAGNOSIS — E860 Dehydration: Secondary | ICD-10-CM | POA: Diagnosis not present

## 2019-05-19 DIAGNOSIS — E039 Hypothyroidism, unspecified: Secondary | ICD-10-CM | POA: Diagnosis present

## 2019-05-19 DIAGNOSIS — N189 Chronic kidney disease, unspecified: Secondary | ICD-10-CM | POA: Diagnosis not present

## 2019-05-19 DIAGNOSIS — Z7982 Long term (current) use of aspirin: Secondary | ICD-10-CM

## 2019-05-19 DIAGNOSIS — Z7189 Other specified counseling: Secondary | ICD-10-CM | POA: Diagnosis not present

## 2019-05-19 DIAGNOSIS — G309 Alzheimer's disease, unspecified: Secondary | ICD-10-CM | POA: Diagnosis present

## 2019-05-19 DIAGNOSIS — K219 Gastro-esophageal reflux disease without esophagitis: Secondary | ICD-10-CM | POA: Diagnosis present

## 2019-05-19 DIAGNOSIS — E785 Hyperlipidemia, unspecified: Secondary | ICD-10-CM | POA: Diagnosis present

## 2019-05-19 DIAGNOSIS — G9341 Metabolic encephalopathy: Secondary | ICD-10-CM | POA: Diagnosis not present

## 2019-05-19 DIAGNOSIS — L89211 Pressure ulcer of right hip, stage 1: Secondary | ICD-10-CM | POA: Diagnosis present

## 2019-05-19 DIAGNOSIS — R000 Tachycardia, unspecified: Secondary | ICD-10-CM | POA: Diagnosis not present

## 2019-05-19 DIAGNOSIS — Z79899 Other long term (current) drug therapy: Secondary | ICD-10-CM

## 2019-05-19 DIAGNOSIS — Z79890 Hormone replacement therapy (postmenopausal): Secondary | ICD-10-CM

## 2019-05-19 DIAGNOSIS — R404 Transient alteration of awareness: Secondary | ICD-10-CM | POA: Diagnosis not present

## 2019-05-19 DIAGNOSIS — L89222 Pressure ulcer of left hip, stage 2: Secondary | ICD-10-CM | POA: Diagnosis present

## 2019-05-19 DIAGNOSIS — E1122 Type 2 diabetes mellitus with diabetic chronic kidney disease: Secondary | ICD-10-CM | POA: Diagnosis present

## 2019-05-19 DIAGNOSIS — I252 Old myocardial infarction: Secondary | ICD-10-CM

## 2019-05-19 DIAGNOSIS — Z66 Do not resuscitate: Secondary | ICD-10-CM | POA: Diagnosis not present

## 2019-05-19 DIAGNOSIS — L89511 Pressure ulcer of right ankle, stage 1: Secondary | ICD-10-CM | POA: Diagnosis present

## 2019-05-19 DIAGNOSIS — I2510 Atherosclerotic heart disease of native coronary artery without angina pectoris: Secondary | ICD-10-CM | POA: Diagnosis present

## 2019-05-19 DIAGNOSIS — R680 Hypothermia, not associated with low environmental temperature: Secondary | ICD-10-CM | POA: Diagnosis present

## 2019-05-19 DIAGNOSIS — A419 Sepsis, unspecified organism: Principal | ICD-10-CM | POA: Diagnosis present

## 2019-05-19 DIAGNOSIS — R531 Weakness: Secondary | ICD-10-CM | POA: Diagnosis not present

## 2019-05-19 DIAGNOSIS — R7989 Other specified abnormal findings of blood chemistry: Secondary | ICD-10-CM | POA: Diagnosis not present

## 2019-05-19 DIAGNOSIS — R0902 Hypoxemia: Secondary | ICD-10-CM | POA: Diagnosis not present

## 2019-05-19 DIAGNOSIS — I130 Hypertensive heart and chronic kidney disease with heart failure and stage 1 through stage 4 chronic kidney disease, or unspecified chronic kidney disease: Secondary | ICD-10-CM | POA: Diagnosis not present

## 2019-05-19 LAB — COMPREHENSIVE METABOLIC PANEL
ALT: 66 U/L — ABNORMAL HIGH (ref 0–44)
AST: 85 U/L — ABNORMAL HIGH (ref 15–41)
Albumin: 3.5 g/dL (ref 3.5–5.0)
Alkaline Phosphatase: 124 U/L (ref 38–126)
Anion gap: 20 — ABNORMAL HIGH (ref 5–15)
BUN: 109 mg/dL — ABNORMAL HIGH (ref 8–23)
CO2: 27 mmol/L (ref 22–32)
Calcium: 9.5 mg/dL (ref 8.9–10.3)
Chloride: 110 mmol/L (ref 98–111)
Creatinine, Ser: 4.09 mg/dL — ABNORMAL HIGH (ref 0.61–1.24)
GFR calc Af Amer: 14 mL/min — ABNORMAL LOW (ref >60)
GFR calc non Af Amer: 12 mL/min — ABNORMAL LOW (ref >60)
Glucose, Bld: 248 mg/dL — ABNORMAL HIGH (ref 70–99)
Potassium: 3 mmol/L — ABNORMAL LOW (ref 3.5–5.1)
Sodium: 157 mmol/L — ABNORMAL HIGH (ref 135–145)
Total Bilirubin: 2 mg/dL — ABNORMAL HIGH (ref 0.3–1.2)
Total Protein: 8 g/dL (ref 6.5–8.1)

## 2019-05-19 LAB — CBC WITH DIFFERENTIAL/PLATELET
Abs Immature Granulocytes: 0.04 10*3/uL (ref 0.00–0.07)
Basophils Absolute: 0 10*3/uL (ref 0.0–0.1)
Basophils Relative: 0 %
Eosinophils Absolute: 0 10*3/uL (ref 0.0–0.5)
Eosinophils Relative: 0 %
HCT: 53.8 % — ABNORMAL HIGH (ref 39.0–52.0)
Hemoglobin: 16.7 g/dL (ref 13.0–17.0)
Immature Granulocytes: 0 %
Lymphocytes Relative: 11 %
Lymphs Abs: 1.2 10*3/uL (ref 0.7–4.0)
MCH: 28.8 pg (ref 26.0–34.0)
MCHC: 31 g/dL (ref 30.0–36.0)
MCV: 92.8 fL (ref 80.0–100.0)
Monocytes Absolute: 0.7 10*3/uL (ref 0.1–1.0)
Monocytes Relative: 6 %
Neutro Abs: 9.3 10*3/uL — ABNORMAL HIGH (ref 1.7–7.7)
Neutrophils Relative %: 83 %
Platelets: 250 10*3/uL (ref 150–400)
RBC: 5.8 MIL/uL (ref 4.22–5.81)
RDW: 15.6 % — ABNORMAL HIGH (ref 11.5–15.5)
WBC: 11.3 10*3/uL — ABNORMAL HIGH (ref 4.0–10.5)
nRBC: 0 % (ref 0.0–0.2)

## 2019-05-19 LAB — INFLUENZA PANEL BY PCR (TYPE A & B)
Influenza A By PCR: NEGATIVE
Influenza B By PCR: NEGATIVE

## 2019-05-19 LAB — BLOOD GAS, VENOUS
Acid-Base Excess: 6.1 mmol/L — ABNORMAL HIGH (ref 0.0–2.0)
Bicarbonate: 29.2 mmol/L — ABNORMAL HIGH (ref 20.0–28.0)
FIO2: 20
O2 Saturation: 88 %
Patient temperature: 36
pCO2, Ven: 44.1 mmHg (ref 44.0–60.0)
pH, Ven: 7.449 — ABNORMAL HIGH (ref 7.250–7.430)
pO2, Ven: 57.5 mmHg — ABNORMAL HIGH (ref 32.0–45.0)

## 2019-05-19 LAB — D-DIMER, QUANTITATIVE: D-Dimer, Quant: 15.51 ug/mL-FEU — ABNORMAL HIGH (ref 0.00–0.50)

## 2019-05-19 LAB — TRIGLYCERIDES: Triglycerides: 316 mg/dL — ABNORMAL HIGH (ref <150)

## 2019-05-19 LAB — C-REACTIVE PROTEIN: CRP: 2.2 mg/dL — ABNORMAL HIGH (ref <1.0)

## 2019-05-19 LAB — FIBRINOGEN: Fibrinogen: 567 mg/dL — ABNORMAL HIGH (ref 210–475)

## 2019-05-19 LAB — PROCALCITONIN: Procalcitonin: 0.13 ng/mL

## 2019-05-19 LAB — LACTIC ACID, PLASMA
Lactic Acid, Venous: 2.9 mmol/L (ref 0.5–1.9)
Lactic Acid, Venous: 1.6 mmol/L (ref 0.5–1.9)

## 2019-05-19 LAB — FERRITIN: Ferritin: 260 ng/mL (ref 24–336)

## 2019-05-19 LAB — MAGNESIUM: Magnesium: 3.2 mg/dL — ABNORMAL HIGH (ref 1.7–2.4)

## 2019-05-19 LAB — GLUCOSE, CAPILLARY: Glucose-Capillary: 207 mg/dL — ABNORMAL HIGH (ref 70–99)

## 2019-05-19 LAB — LACTATE DEHYDROGENASE: LDH: 198 U/L — ABNORMAL HIGH (ref 98–192)

## 2019-05-19 MED ORDER — SODIUM CHLORIDE 0.9% FLUSH
3.0000 mL | Freq: Two times a day (BID) | INTRAVENOUS | Status: DC
  Administered 2019-05-19 – 2019-05-23 (×6): 3 mL via INTRAVENOUS

## 2019-05-19 MED ORDER — ORAL CARE MOUTH RINSE
15.0000 mL | Freq: Two times a day (BID) | OROMUCOSAL | Status: DC
  Administered 2019-05-19 – 2019-05-24 (×11): 15 mL via OROMUCOSAL

## 2019-05-19 MED ORDER — ACETAMINOPHEN 650 MG RE SUPP
650.0000 mg | Freq: Four times a day (QID) | RECTAL | Status: DC | PRN
  Administered 2019-05-23: 650 mg via RECTAL
  Filled 2019-05-19: qty 1

## 2019-05-19 MED ORDER — SODIUM CHLORIDE 0.9 % IV BOLUS
1000.0000 mL | Freq: Once | INTRAVENOUS | Status: AC
  Administered 2019-05-19: 1000 mL via INTRAVENOUS

## 2019-05-19 MED ORDER — HEPARIN SODIUM (PORCINE) 10000 UNIT/ML IJ SOLN
7500.0000 [IU] | Freq: Three times a day (TID) | INTRAMUSCULAR | Status: DC
  Filled 2019-05-19: qty 1

## 2019-05-19 MED ORDER — DEXTROSE 5 % IV SOLN
500.0000 mg | INTRAVENOUS | Status: DC
  Filled 2019-05-19: qty 0.5

## 2019-05-19 MED ORDER — SODIUM CHLORIDE 0.45 % IV SOLN: INTRAVENOUS | Status: DC

## 2019-05-19 MED ORDER — INSULIN ASPART 100 UNIT/ML ~~LOC~~ SOLN
0.0000 [IU] | Freq: Three times a day (TID) | SUBCUTANEOUS | Status: DC
  Administered 2019-05-20: 2 [IU] via SUBCUTANEOUS
  Administered 2019-05-20: 3 [IU] via SUBCUTANEOUS
  Administered 2019-05-21: 1 [IU] via SUBCUTANEOUS

## 2019-05-19 MED ORDER — SODIUM CHLORIDE 0.9 % IV BOLUS (SEPSIS)
250.0000 mL | Freq: Once | INTRAVENOUS | Status: AC
  Administered 2019-05-19: 250 mL via INTRAVENOUS

## 2019-05-19 MED ORDER — METRONIDAZOLE IN NACL 5-0.79 MG/ML-% IV SOLN
500.0000 mg | Freq: Once | INTRAVENOUS | Status: AC
  Administered 2019-05-19: 500 mg via INTRAVENOUS
  Filled 2019-05-19: qty 100

## 2019-05-19 MED ORDER — VANCOMYCIN HCL IN DEXTROSE 1-5 GM/200ML-% IV SOLN
1000.0000 mg | Freq: Once | INTRAVENOUS | Status: AC
  Administered 2019-05-19: 1000 mg via INTRAVENOUS
  Filled 2019-05-19: qty 200

## 2019-05-19 MED ORDER — SODIUM CHLORIDE 0.9 % IV SOLN
2.0000 g | Freq: Once | INTRAVENOUS | Status: AC
  Administered 2019-05-19: 2 g via INTRAVENOUS
  Filled 2019-05-19: qty 2

## 2019-05-19 MED ORDER — HEPARIN SODIUM (PORCINE) 5000 UNIT/ML IJ SOLN
7500.0000 [IU] | Freq: Three times a day (TID) | INTRAMUSCULAR | Status: DC
  Administered 2019-05-19: 7500 [IU] via SUBCUTANEOUS
  Filled 2019-05-19: qty 2

## 2019-05-19 MED ORDER — ACETAMINOPHEN 325 MG PO TABS: 650.0000 mg | ORAL_TABLET | Freq: Four times a day (QID) | ORAL | Status: DC | PRN

## 2019-05-19 NOTE — ED Triage Notes (Signed)
Pt dx on 12/14 with covid. Pt continually getting worse and not eating. Not talking or usual self today  Sats 89% on room air

## 2019-05-19 NOTE — Progress Notes (Signed)
Pt continues  to be hypotensive but MAP has been above 65. Paged Dr. Myna Hidalgo for acceptable parameters.

## 2019-05-19 NOTE — Progress Notes (Signed)
Pharmacy Antibiotic Note  [name] is a 85 y.o. male admitted on 05/26/2019 with sepsis.  Pharmacy has been consulted for cefepime dosing. Patient admitted from SNF with covid, diagnosed ~1-2 weeks ago (no positive test while admitted). He is afebrile with a temperature of 96.8, has an O2 saturation of 91-95%, LA 2.9, PCT 0.13, WBC 11.3. Patient has poor renal function with a creatinine of 4.09 today (likely more elevated than his baseline as it was 1.58 in July of this year). CrCl estimated around 11 ml/min with no notes of HD. Last dose of cefepime given in the ED on 12/21 at 1536.   Plan: cefepime 500 mg IV every 24 hours  starting 12/22 at 1600 Monitor clinical status, cultures, renal function, and length of therapy.   Height: 5\' 11"  (180.3 cm) Weight: 150 lb (68 kg) IBW/kg (Calculated) : 75.3  Temp (24hrs), Avg:96.8 F (36 C), Min:96.8 F (36 C), Max:96.8 F (36 C)  Recent Labs  Lab 05/14/2019 1525 05/20/2019 1527 05/26/2019 1704  WBC  --  11.3*  --   CREATININE  --  4.09*  --   LATICACIDVEN 2.9*  --  1.6    Estimated Creatinine Clearance: 11.1 mL/min (A) (by C-G formula based on SCr of 4.09 mg/dL (H)).    No Known Allergies  Antimicrobials this admission: Metronidazole 12/21 x1 Vancomycin  12/21 x 1 Cefepime 12/21 >>    Microbiology results: 12/21 BCx: pending 12/21 UCx: sent    Thank you for allowing pharmacy to be a part of this patient's care.  Eddie Candle, PharmD PGY-1 Pharmacy Resident   Please check amion for clinical pharmacist contact number    04/30/2019 7:41 PM

## 2019-05-19 NOTE — H&P (Signed)
History and Physical    PLEASE NOTE THAT DRAGON DICTATION SOFTWARE WAS USED IN THE CONSTRUCTION OF THIS NOTE.   [name] QAS:[id number] DOB: Dec 16, 1924 DOA: 05/23/2019  PCP: Janora Norlander, DO Patient coming from: [facility] (in [place], [place])  I have personally briefly reviewed patient's old medical records in [place]  Chief Complaint: Increased somnolence  HPI: [name] is a 85 y.o. male with medical history significant for advanced dementia, non-verbal at baseline, coronary artery disease status post PCI in 1996, stage III chronic kidney disease with baseline creatinine of 1.4-1.6, type 2 diabetes mellitus, hypertension, acquired hypothyroidism, who is admitted to [facility] on 05/27/2019 with acute metabolic encephalopathy after presenting from SNF to [facility] emergency department for further evaluation of increased somnolence.  In the setting of the patient's advanced dementia, the following history is provided by the patient's daughter [name] [facility]), my discussions with the emergency department physician, and via chart review.  The patient has reportedly been demonstrating to 3 days of increased somnolence, sleeping several hours more relative to baseline sleep requirements.  This period time is also been associated with diminished oral intake.  Not associated with any vomiting or diarrhea.  Additionally, SNF staff has reportedly noted generalized weakness over the last week.  Specifically, the patient reportedly ambulates with a walker at baseline, but in the setting of progressive generalized weakness over the last week, has been too weak to ambulate at all.  Outpatient COVID-19 testing performed on 05/12/2019 was found to be positive, and confirmed via FAX sent by [facility] to [facility] emergency department this evening.  The patient has a distant smoking history, having reportedly he completely quit smoking in the 1940s, and possesses no  known chronic underlying pulmonary conditions.  Per discussions with the patient's POA, the patient is to be full code.     ED Course:  Vital signs in the ED were notable for the following: Temperature max 96.8; initial heart rate 109, which decreased to 86 following interval administration of IV fluids, as further described below; initial blood pressure 97/83, which increased to 136/70 following interval IV fluids; respiratory rate 19-23, and oxygen saturation 93 to 95% on room air.  Labs were notable for the following: CMP notable for sodium 157, potassium 3.0, bicarbonate 27, anion gap 20, BUN 109, creatinine 4.09 relative to most recent prior creatinine data point of 1.58 on 12/19/2018, glucose 248; inflammatory markers were notable for the following elevated values: LDH 198, CRP 2.2, fibrinogen 567, D-dimer 15.5.  CBC notable for white blood cell count of 11.3.  Initial lactic acid 2.9, with follow-up value decreasing to 1.6.  Procalcitonin 0.13.  Rapid influenza A/B was found to be negative.  Blood cultures x2 were collected prior to initiation of any antibiotics.  Chest x-ray showed low lung volumes without any evidence of acute cardiopulmonary process.  In the setting of evidence of purulent discharge from the urethral meatus, the patient was started on cefepime as well as IV vancomycin in the ED.   Additionally, while in the ED, the following were also administered: IV normal saline x250 cc bolus followed by 1 L IV normal saline bolus.  In addition to the above antibiotics, the patient also received a dose of IV Flagyl.  Subsequently, the patient was admitted for further evaluation and management of presenting acute metabolic encephalopathy in the setting of COVID-19 as well as AKI superimposed on CKD.     Review of Systems: As  per HPI otherwise 10 point review of systems negative.   Past Medical History:  Diagnosis Date  . Aortic insufficiency   . Aortic valve sclerosis   . Bronchitis    . CHF (congestive heart failure) ([place])   . Dementia ([place])   . Diabetes mellitus    Diet control   . Diverticulosis   . Dyslipidemia   . GERD (gastroesophageal reflux disease)   . Hard of hearing   . Hemorrhoids   . Hernia cerebri ([place])   . History of pneumonia   . Hypertension   . Hypothyroidism   . MI (myocardial infarction) ([place]) 1996   percutaneous coronary intervention   . Small bowel obstruction [facility])     Past Surgical History:  Procedure Laterality Date  . ABDOMINAL HERNIA REPAIR     multiple sx's  . CATARACT EXTRACTION     left eye  . CHOLECYSTECTOMY    . COLONOSCOPY  2008  . EXPLORATORY LAPAROTOMY W/ BOWEL RESECTION     ventral hernia repair  . EYE SURGERY Bilateral   . TRANSURETHRAL RESECTION OF PROSTATE      Social History:  reports that he quit smoking about 76 years ago. He has never used smokeless tobacco. He reports that he does not drink alcohol or use drugs.   No Known Allergies  Family History  Problem Relation Age of Onset  . Colon cancer Neg Hx     Prior to Admission medications   Medication Sig Start Date End Date Taking? Authorizing Provider  albuterol (PROVENTIL) (2.5 MG/3ML) 0.083% nebulizer solution Take 3 mLs (2.5 mg total) by nebulization every 6 (six) hours as needed for wheezing or shortness of breath. 07/02/14   Chipper Herb, MD  aspirin 81 MG tablet Take 81 mg by mouth daily.    [provider]  Blood Glucose Monitoring Suppl (ACCU-CHEK AVIVA PLUS) w/Device KIT USE  AS  INSTRUCTED 07/05/17   Timmothy Euler, MD  cholecalciferol (VITAMIN D) 1000 units tablet Take 1,000 Units by mouth daily.    [provider]  clotrimazole-betamethasone Donalynn Furlong) cream  04/26/17   [provider]  Diaper Rash Products (DESITIN EX) Apply 1 application topically daily.    [provider]  docusate sodium (COLACE) 100 MG capsule Take 1 capsule (100 mg total) by mouth at bedtime. 11/01/15   Claretta Fraise, MD   fluticasone (FLONASE) 50 MCG/ACT nasal spray Place 1 spray into both nostrils 2 (two) times daily as needed for allergies or rhinitis. 02/26/18   Janora Norlander, DO  furosemide (LASIX) 40 MG tablet Take 1 tablet (40 mg total) by mouth 2 (two) times daily. 03/18/19   Janora Norlander, DO  gabapentin (NEURONTIN) 300 MG capsule Take 300 mg by mouth at bedtime. 08/27/18   [provider]  glucose blood (ACCU-CHEK AVIVA) test strip 1 each by Other route daily. Use as instructed 04/23/17   Timmothy Euler, MD  JANUVIA 50 MG tablet TAKE 1 TABLET DAILY 03/18/19   Ronnie Doss M, DO  ketoconazole (NIZORAL) 2 % shampoo Apply 1 application topically 2 (two) times a week. x2-3 weeks 12/19/18   Janora Norlander, DO  Lancet Devices (ACCU-CHEK [place]) lancets 1 each by Other route daily. 04/23/17   Timmothy Euler, MD  levothyroxine (SYNTHROID) 88 MCG tablet TAKE 1 TABLET DAILY 03/04/19   Ronnie Doss M, DO  meclizine (ANTIVERT) 25 MG tablet Take 1 tablet (25 mg total) by mouth 3 (three) times daily as needed for  dizziness. 07/19/16   Terald Sleeper, PA-C  metoprolol succinate (TOPROL-XL) 25 MG 24 hr tablet TAKE 1 TABLET DAILY 03/04/19   Ronnie Doss M, DO  mirtazapine (REMERON) 7.5 MG tablet Take 1 tablet (7.5 mg total) by mouth at bedtime. 11/15/18   Janora Norlander, DO  Natural Senna Laxative 64.8-324 MG TABS Take 1 tablet by mouth at bedtime.     [provider]  Omega-3 Fatty Acids (FISH OIL) 1000 MG CAPS Take 1 capsule by mouth daily.    [provider]  omeprazole (PRILOSEC) 20 MG capsule TAKE (1) CAPSULE DAILY 02/26/18   Ronnie Doss M, DO  potassium chloride (K-DUR) 10 MEQ tablet Take 1 tablet (10 mEq total) by mouth 2 (two) times daily. As a potassium supplement 12/26/18   Ronnie Doss M, DO  simvastatin (ZOCOR) 20 MG tablet TAKE 1 TABLET EVERY DAY 03/04/19   Ronnie Doss [place], DO     Objective    Physical Exam: Vitals:    05/24/2019 1544 05/26/2019 1545 05/03/2019 1600 05/12/2019 1743  BP:   119/83   Pulse: (!) 102 (!) 102    Resp: (!) 21 20    Temp:      TempSrc:      SpO2: 93% 93%    Weight:    68 kg  Height:    '5\' 11"'$  (1.803 m)    General: appears to be stated age; will briefly open eyes to verbal stimuli; unable to follow instructions; non-verbal (which is reportedly baseline). Skin: warm, dry. Head:  AT/[place] Eyes:  PEARL b/l, EOMI Mouth:  Oral mucosa membranes appear dry, normal dentition Neck: supple; trachea midline Heart:  RRR; did not appreciate any M/R/G Lungs: Diminished bibasilar breath sounds, but otherwise CTAB; did not appreciate any wheezes, rales, or rhonchi Abdomen: + BS; soft, ND, NT Extremities: no peripheral edema, no muscle wasting Neuro: Unable to perform full neurologic exam in the context of the patient's advanced dementia and associated inability to follow instructions; therefore unable to perform full evaluation of strength and sensation, although the patient was noted to spontaneously move all 4 extremities.   Labs on Admission: I have personally reviewed following labs and imaging studies  CBC: Recent Labs  Lab 05/22/2019 1527  WBC 11.3*  NEUTROABS 9.3*  HGB 16.7  HCT 53.8*  MCV 92.8  PLT 970   Basic Metabolic Panel: Recent Labs  Lab 05/27/2019 1527  NA 157*  K 3.0*  CL 110  CO2 27  GLUCOSE 248*  BUN 109*  CREATININE 4.09*  CALCIUM 9.5   GFR: Estimated Creatinine Clearance: 11.1 mL/min (A) (by C-G formula based on SCr of 4.09 mg/dL (H)). Liver Function Tests: Recent Labs  Lab 05/21/2019 1527  AST 85*  ALT 66*  ALKPHOS 124  BILITOT 2.0*  PROT 8.0  ALBUMIN 3.5   No results for input(s): LIPASE, AMYLASE in the last 168 hours. No results for input(s): AMMONIA in the last 168 hours. Coagulation Profile: No results for input(s): INR, PROTIME in the last 168 hours. Cardiac Enzymes: No results for input(s): CKTOTAL, CKMB, CKMBINDEX, TROPONINI in the last 168  hours. BNP (last 3 results) No results for input(s): PROBNP in the last 8760 hours. HbA1C: No results for input(s): HGBA1C in the last 72 hours. CBG: No results for input(s): GLUCAP in the last 168 hours. Lipid Profile: Recent Labs    05/21/2019 1529  TRIG 316*   Thyroid Function Tests: No results for input(s): TSH, T4TOTAL, FREET4, T3FREE, THYROIDAB in the  last 72 hours. Anemia Panel: Recent Labs    05/08/2019 1529  FERRITIN 260   Urine analysis:    Component Value Date/Time   COLORURINE AMBER (A) 02/16/2017 0944   APPEARANCEUR Clear 03/01/2017 1627   LABSPEC 1.027 02/16/2017 0944   PHURINE 8.0 02/16/2017 0944   GLUCOSEU Trace (A) 03/01/2017 1627   HGBUR MODERATE (A) 02/16/2017 0944   BILIRUBINUR Negative 03/01/2017 1627   KETONESUR NEGATIVE 02/16/2017 0944   PROTEINUR 1+ (A) 03/01/2017 1627   PROTEINUR 100 (A) 02/16/2017 0944   UROBILINOGEN 0.2 08/11/2010 1214   NITRITE Negative 03/01/2017 1627   NITRITE POSITIVE (A) 02/16/2017 0944   LEUKOCYTESUR 3+ (A) 03/01/2017 1627    Radiological Exams on Admission: DG Chest Port 1 View  Result Date: 05/24/2019 CLINICAL DATA:  Weakness and shortness of breath. EXAM: PORTABLE CHEST 1 VIEW COMPARISON:  02/16/2017 FINDINGS: The heart is within normal limits and size given the AP projection. There is mild tortuosity of the thoracic aorta. Low lung volumes with vascular crowding and streaky basilar atelectasis. No definite pleural effusions. Pulmonary scarring changes. IMPRESSION: Low lung volumes with vascular crowding and atelectasis. Pulmonary scarring changes. Electronically Signed   By: Marijo Sanes M.D.   On: 05/02/2019 15:48     Assessment/Plan   [name] is a 85 y.o. male with medical history significant for advanced dementia, non-verbal at baseline, coronary artery disease status post PCI in 1996, stage III chronic kidney disease with baseline creatinine of 1.4-1.6, type 2 diabetes mellitus, hypertension, acquired  hypothyroidism, who is admitted to [facility] on 05/14/2019 with acute metabolic encephalopathy after presenting from SNF to [facility] emergency department for further evaluation of increased somnolence.   Principal Problem:   Acute metabolic encephalopathy Active Problems:   Dehydration   Hypokalemia   COVID-19 virus infection   AKI (acute kidney injury) ([place])   Hypernatremia   #) Acute metabolic encephalopathy: In the setting of advanced dementia which the patient is nonverbal at baseline, he presents with 2 to 3 days of increased somnolence, which appears to be metabolic in nature on the basis of multifactorial contributions from underlying physiologic stress from COVID-19 as well as dehydration associated hypernatremia.  Case was discussed with the patient's daughter [name] [phone]), who is the patient's POA and confirms that the patient is nonverbal at baseline.   Plan: Work-up and management of COVID-19, as further described below.  Work-up and management of acute kidney injury superimposed on chronic kidney disease the setting of dehydration, with gentle IV fluids, as further described below.  Keep n.p.o. at this time.  Check TSH.  Check blood gas to evaluate for any contribution from hypercapnic encephalopathy.  Repeat CMP in the morning.     #) COVID-19 infection: Outpatient testing performed on 05/12/2019 revealed positive COVID-19 findings, as confirmed by faxed test result sent by patient's SNF to [facility] emergency department this evening.  Presentation is associated with elevation of increased general inflammatory markers consistent with COVID-19, while chest x-ray shows no evidence of acute cardiopulmonary process.  At this time, the patient does not appear to be in any respiratory distress, and is maintaining oxygen saturations in the mid 90s on room air.  In the absence of acute hypoxia, criteria are not met for initiation of dexamethasone or remdesivir at  this time per treatment guidance recommendations from [place].  Of note, no known chronic underlying pulmonary conditions.  As the patient does not require PCU or ICU  level care at this time, criteria are not met for transfer to [facility], and will plan to admit to [facility].  Of note, it does not appear that the patient will be able to follow instructions necessary to undergo proning protocol.  Plan: Airborne and contact precautions with eye protection.  Check ABG for purpose of evaluating PaO2 to FiO2 ratio.  Monitor continuous pulse oximetry.  As needed supplemental oxygen order to maintain oxygen saturations greater than or equal to 92%, with consideration for initiation of remdesivir/Decadron to the patient develop evidence of acute hypoxia.  Monitor on telemetry.  Add on serum magnesium level to labs are collected in the ED.  Check serum phosphorus level.  Repeat CBC with differential in the morning.  Repeat inflammatory markers in the morning.  In the setting of presenting D-dimer greater than 5, will initiate intermediate dose DVT prophylaxis with heparin 7500 units subcu 3 times daily.      #) Severe sepsis: SIRS criteria met via presenting leukocytosis, tachycardia, tachypnea in the setting of COVID-19, as further described above.  Criteria are met for patient sepsis to be considered severe nature on the basis of concomitant presenting elevated lactic acid of 2.9.  In addition to this underlying viral infxn, there is also concern for potential underlying bacterial etiology.  Specifically, there is potential urinary tract infection in setting of purulent discharge noted from the urethral meatus in the ED this evening.  Will continue antibiotics, as further described below.  Of note, blood cultures x2 were collected in the ED prior to initiation of any antibiotics.  Additionally, repeat lactic acid has decreased to 1.6 following interval administration of IV fluids,  as further quantified above.  Presentation has not been associated with any evidence of hypotension, and consequently, there is no indication at present time for additional IV fluid bolus.  Of note, no evidence of bacterial pneumonia on presenting chest x-ray, while procalcitonin found to be nonelevated at 0.13, which holds a strong negative predictive value in the setting of enhanced inflammatory response due to concomitant presenting COVID-19.  Plan: Work-up and management of COVID-19, as above.  Check urinalysis.  Continue cefepime.  Monitor for results of blood cultures x2 collected in the ED.  Repeat CBC with differential in the morning.      #) Acute kidney injury superimposed on stage III chronic kidney disease: In the setting of baseline creatinine range of 1.4-1.6, with most recent prior creatinine found to be 1.58 on 12/19/2018, presenting labs reflect interval increase in creatinine to 4.09.  This appears to be prerenal in nature in the setting of intravascular depletion stemming from dehydration from recent decline in oral intake as well as concomitant severe sepsis as above.  Further support for dehydration contributing to acute kidney injury includes evidence of concomitant prerenal azotemia as well as hyponatremia due to associated free water deficit. while in the ED, the patient received a total of 1.25 L of IV NS bolus.  While I have not yet encountered a formal record of prior echocardiogram results, there is documentation of a history of chronic diastolic heart failure.  In the context of this reported history as well as the patient's age and body habitus, will proceed conservatively with administration of additional IV fluids, as further quantify below, with plan to calculate residual free water deficit in the morning, with corresponding adjustment of IV fluids at the time.  Plan: Half-normal saline at 50 cc/h.  Monitor strict I's and O's and daily weights.  Attempt to avoid nephrotoxic  agents.  Check urinalysis with microscopic evaluation for the presence of urinary casts.  Check random urine sodium as well as random urine creatinine.  Repeat BMP in the morning.  Hold home Lasix.     #) Hypokalemia: Presenting labs notable for serum potassium 3.0.  In the setting of concomitant presenting acute renal failure, will refrain from aggressive potassium supplementation at this time.  Plan: Add on serum magnesium level to labs already collected in the ED.  Repeat BMP in the morning.  Work-up and management of presenting acute kidney injury, as further described above.      #) Type 2 diabetes mellitus: Does not appear to be on chronic insulin therapy as an outpatient.  Outpatient diabetic regimen appears to consist of Januvia.  Presenting blood sugar per presenting CMP noted to be 248.  Plan: Hold home Januvia during this hospitalization.  Accu-Cheks before every meal and at bedtime with low-dose sliding scale insulin.      #) Acquired hypothyroidism: On Synthroid as an outpatient.  In the setting of presenting acute encephalopathy, will check TSH to evaluate for any further metabolic contribution from this endocrine pathway.   Plan: Check TSH, as described above.  Will hold home Synthroid in the setting of current n.p.o. status.     DVT prophylaxis: Heparin 7500 units subcutaneous 3 times daily to be consistent with recommendation for intermediate dose DVT prophylaxis in the setting of COVID-19 plus elevated D-dimer greater than 5 Code Status: Full code (as confirmed by the patient's POA, as further described above) Family Communication: Case was discussed with the patient's POA/daughter [name]), as above. Disposition Plan:  Per Rounding Team Consults called: None Admission status: Inpatient; med telemetry.    PLEASE NOTE THAT DRAGON DICTATION SOFTWARE WAS USED IN THE CONSTRUCTION OF THIS NOTE.   [place] Pager  [phone] From 3PM- 11PM.   Otherwise, please contact night-coverage  [email or web address] Password [email or web address]  05/05/2019, 6:01 PM

## 2019-05-19 NOTE — ED Notes (Signed)
Date and time results received: 05/24/2019 1617 (use smartphrase ".now" to insert current time)  Test: lac acid  Critical Value: 2.9  Name of Provider Notified: khout  Orders Received? Or Actions Taken?: see chart

## 2019-05-19 NOTE — ED Provider Notes (Signed)
[facility] EMERGENCY DEPARTMENT Provider Note   CSN: [id number] Arrival date & time: 05/18/2019  1415     History Chief Complaint  Patient presents with  . Altered Mental Status  . covid positive    [name] is a 85 y.o. male with history of diabetes, advanced Alzheimer's dementia, nonverbal, hypertension, chronic diastolic heart failure?  AF, hypothyroidism and report of recently diagnosed COVID-19 illness 1 to 2 weeks ago brought to the ER by EMS from living facility [facility] in [place] for evaluation of altered mental status.  History available is limited, per triage note has been progressively getting worse after diagnosis of COVID-19 on 12/14.  Not talking or eating.  Pulse oximeter 89% on room air by EMS.  Level 5 caveat due to dementia.  Patient does not respond verbally, will squeeze my hand bilaterally.  Will attempt to contact daughter POA [name] [phone] and [phone] POA.       HPI     Past Medical History:  Diagnosis Date  . Aortic insufficiency   . Aortic valve sclerosis   . Bronchitis   . CHF (congestive heart failure) ([place])   . Dementia ([place])   . Diabetes mellitus    Diet control   . Diverticulosis   . Dyslipidemia   . GERD (gastroesophageal reflux disease)   . Hard of hearing   . Hemorrhoids   . Hernia cerebri ([place])   . History of pneumonia   . Hypertension   . Hypothyroidism   . MI (myocardial infarction) ([place]) 1996   percutaneous coronary intervention   . Small bowel obstruction [facility])     Patient Active Problem List   Diagnosis Date Noted  . Pressure injury of skin 02/18/2017  . Generalized weakness 02/16/2017  . Vertigo 02/13/2017  . CKD (chronic kidney disease), stage III 02/13/2017  . Chronic diastolic CHF (congestive heart failure) ([place]) 02/13/2017  . Orthostasis 02/13/2017  . Eczema 08/31/2015  . Ascites 08/31/2015  . Right hip pain 08/10/2015  . Diabetic polyneuropathy associated with type 2 diabetes mellitus  ([place]) 08/10/2015  . Alzheimer's dementia ([place]) 08/10/2015  . Hypothyroid 12/04/2012  . Chronic back pain 12/04/2012  . Hard of hearing 09/03/2012  . Aortic insufficiency 09/03/2012  . Vitamin D deficiency 09/03/2012  . Arthritis 09/03/2012  . Insomnia 09/03/2012  . Iron deficiency anemia, unspecified 07/31/2011  . Diabetes mellitus type II, non insulin dependent ([place]) 11/30/2009  . Hyperlipidemia 11/30/2009  . Essential hypertension 11/30/2009  . CORONARY ATHEROSCLEROSIS NATIVE CORONARY ARTERY 11/30/2009    Past Surgical History:  Procedure Laterality Date  . ABDOMINAL HERNIA REPAIR     multiple sx's  . CATARACT EXTRACTION     left eye  . CHOLECYSTECTOMY    . COLONOSCOPY  2008  . EXPLORATORY LAPAROTOMY W/ BOWEL RESECTION     ventral hernia repair  . EYE SURGERY Bilateral   . TRANSURETHRAL RESECTION OF PROSTATE         Family History  Problem Relation Age of Onset  . Colon cancer Neg Hx     Social History   Tobacco Use  . Smoking status: Former Smoker    Quit date: 08/09/1942    Years since quitting: 76.8  . Smokeless tobacco: Never Used  Substance Use Topics  . Alcohol use: No  . Drug use: No    Home Medications Prior to Admission medications   Medication Sig Start Date End Date Taking? Authorizing Provider  albuterol (PROVENTIL) (2.5 MG/3ML) 0.083% nebulizer solution Take 3  mLs (2.5 mg total) by nebulization every 6 (six) hours as needed for wheezing or shortness of breath. 07/02/14   Chipper Herb, MD  aspirin 81 MG tablet Take 81 mg by mouth daily.    [provider]  Blood Glucose Monitoring Suppl (ACCU-CHEK AVIVA PLUS) w/Device KIT USE  AS  INSTRUCTED 07/05/17   Timmothy Euler, MD  cholecalciferol (VITAMIN D) 1000 units tablet Take 1,000 Units by mouth daily.    [provider]  clotrimazole-betamethasone Donalynn Furlong) cream  04/26/17   [provider]  Diaper Rash Products (DESITIN EX) Apply 1 application topically daily.     [provider]  docusate sodium (COLACE) 100 MG capsule Take 1 capsule (100 mg total) by mouth at bedtime. 11/01/15   Claretta Fraise, MD  fluticasone (FLONASE) 50 MCG/ACT nasal spray Place 1 spray into both nostrils 2 (two) times daily as needed for allergies or rhinitis. 02/26/18   Janora Norlander, DO  furosemide (LASIX) 40 MG tablet Take 1 tablet (40 mg total) by mouth 2 (two) times daily. 03/18/19   Janora Norlander, DO  gabapentin (NEURONTIN) 300 MG capsule Take 300 mg by mouth at bedtime. 08/27/18   [provider]  glucose blood (ACCU-CHEK AVIVA) test strip 1 each by Other route daily. Use as instructed 04/23/17   Timmothy Euler, MD  JANUVIA 50 MG tablet TAKE 1 TABLET DAILY 03/18/19   Ronnie Doss M, DO  ketoconazole (NIZORAL) 2 % shampoo Apply 1 application topically 2 (two) times a week. x2-3 weeks 12/19/18   Janora Norlander, DO  Lancet Devices (ACCU-CHEK [place]) lancets 1 each by Other route daily. 04/23/17   Timmothy Euler, MD  levothyroxine (SYNTHROID) 88 MCG tablet TAKE 1 TABLET DAILY 03/04/19   Ronnie Doss M, DO  meclizine (ANTIVERT) 25 MG tablet Take 1 tablet (25 mg total) by mouth 3 (three) times daily as needed for dizziness. 07/19/16   Terald Sleeper, PA-C  metoprolol succinate (TOPROL-XL) 25 MG 24 hr tablet TAKE 1 TABLET DAILY 03/04/19   Ronnie Doss M, DO  mirtazapine (REMERON) 7.5 MG tablet Take 1 tablet (7.5 mg total) by mouth at bedtime. 11/15/18   Janora Norlander, DO  Natural Senna Laxative 64.8-324 MG TABS Take 1 tablet by mouth at bedtime.     [provider]  Omega-3 Fatty Acids (FISH OIL) 1000 MG CAPS Take 1 capsule by mouth daily.    [provider]  omeprazole (PRILOSEC) 20 MG capsule TAKE (1) CAPSULE DAILY 02/26/18   Ronnie Doss M, DO  potassium chloride (K-DUR) 10 MEQ tablet Take 1 tablet (10 mEq total) by mouth 2 (two) times daily. As a potassium supplement 12/26/18   Ronnie Doss M, DO   simvastatin (ZOCOR) 20 MG tablet TAKE 1 TABLET EVERY DAY 03/04/19   Ronnie Doss M, DO    Allergies    Patient has no known allergies.  Review of Systems   Review of Systems  Unable to perform ROS: Dementia    Physical Exam Updated Vital Signs BP 119/83   Pulse (!) 102   Temp (!) 96.8 F (36 C) (Rectal)   Resp 20   Ht 5' 11"  (1.803 m)   Wt 68 kg   SpO2 93%   BMI 20.92 kg/m   Physical Exam Vitals and nursing note reviewed.  Constitutional:      General: He is not in acute distress.    Appearance: He is well-developed. He is ill-appearing.  Comments: Ill appearing, non verbal   HENT:     Head: Normocephalic and atraumatic.     Right Ear: External ear normal.     Left Ear: External ear normal.     Nose: Nose normal.     Mouth/Throat:     Mouth: Mucous membranes are dry.     Comments: Very dry MM, lips.  Build up of white secretions in corners of mouth oropharynx  Eyes:     General: No scleral icterus.    Conjunctiva/sclera: Conjunctivae normal.  Cardiovascular:     Rate and Rhythm: Normal rate and regular rhythm.     Heart sounds: Normal heart sounds.     Comments: No LE edema. 1+ radial and DP pulses bilaterally  Pulmonary:     Effort: Respiratory distress present.     Comments: tachypnic RR high 20s. Some belly breathing. Diminished air sounds throughout. No obvious crackles or wheezing.  SpO2 91% at rest.  Genitourinary:    Penis: Discharge present.      Comments: Purulent discharge build up around urethra/meatus, foreskin pulled back and significant purulent discharge noted. No urethral bleeding.  Musculoskeletal:        General: No deformity. Normal range of motion.     Cervical back: Normal range of motion and neck supple.  Skin:    General: Skin is warm and dry.     Capillary Refill: Capillary refill takes less than 2 seconds.     Findings: Wound present.     Comments: Superficial skin break down around sacral skin and upper buttocks bilaterally.  No deep wounds, drainage, bleeding, incontinent of stool   Neurological:     Mental Status: He is alert and oriented to person, place, and time.     Comments:  Awake, eyes open. No response to verbal stimulus.  Squeezes both hands after command.  Does not follow any other commands. Non verbal.  Spontaneous protective blinking.  Symmetric resistance with movement of all extremities.     ED Results / Procedures / Treatments   Labs (all labs ordered are listed, but only abnormal results are displayed) Labs Reviewed  LACTIC ACID, PLASMA - Abnormal; Notable for the following components:      Result Value   Lactic Acid, Venous 2.9 (*)    All other components within normal limits  COMPREHENSIVE METABOLIC PANEL - Abnormal; Notable for the following components:   Sodium 157 (*)    Potassium 3.0 (*)    Glucose, Bld 248 (*)    BUN 109 (*)    Creatinine, Ser 4.09 (*)    AST 85 (*)    ALT 66 (*)    Total Bilirubin 2.0 (*)    GFR calc non Af Amer 12 (*)    GFR calc Af Amer 14 (*)    Anion gap 20 (*)    All other components within normal limits  CBC WITH DIFFERENTIAL/PLATELET - Abnormal; Notable for the following components:   WBC 11.3 (*)    HCT 53.8 (*)    RDW 15.6 (*)    Neutro Abs 9.3 (*)    All other components within normal limits  BLOOD GAS, VENOUS - Abnormal; Notable for the following components:   pH, Ven 7.449 (*)    pO2, Ven 57.5 (*)    Bicarbonate 29.2 (*)    Acid-Base Excess 6.1 (*)    All other components within normal limits  D-DIMER, QUANTITATIVE (NOT AT [facility]) - Abnormal; Notable for the following components:  D-Dimer, Quant 15.51 (*)    All other components within normal limits  LACTATE DEHYDROGENASE - Abnormal; Notable for the following components:   LDH 198 (*)    All other components within normal limits  TRIGLYCERIDES - Abnormal; Notable for the following components:   Triglycerides 316 (*)    All other components within normal limits  FIBRINOGEN - Abnormal;  Notable for the following components:   Fibrinogen 567 (*)    All other components within normal limits  C-REACTIVE PROTEIN - Abnormal; Notable for the following components:   CRP 2.2 (*)    All other components within normal limits  CULTURE, BLOOD (ROUTINE X 2)  CULTURE, BLOOD (ROUTINE X 2)  URINE CULTURE  LACTIC ACID, PLASMA  INFLUENZA PANEL BY PCR (TYPE A & B)  PROCALCITONIN  FERRITIN  URINALYSIS, ROUTINE W REFLEX MICROSCOPIC    EKG EKG Interpretation  Date/Time:  Monday May 19 2019 17:12:11 EST Ventricular Rate:  95 PR Interval:    QRS Duration: 116 QT Interval:  404 QTC Calculation: 508 R Axis:   -20 Text Interpretation: Sinus tachycardia Multiple ventricular premature complexes Nonspecific intraventricular conduction delay Low voltage, precordial leads Baseline wander in lead(s) II III aVF Confirmed by Virgel Manifold [phone]) on 05/01/2019 5:21:48 PM   Radiology DG Chest Port 1 View  Result Date: 05/23/2019 CLINICAL DATA:  Weakness and shortness of breath. EXAM: PORTABLE CHEST 1 VIEW COMPARISON:  02/16/2017 FINDINGS: The heart is within normal limits and size given the AP projection. There is mild tortuosity of the thoracic aorta. Low lung volumes with vascular crowding and streaky basilar atelectasis. No definite pleural effusions. Pulmonary scarring changes. IMPRESSION: Low lung volumes with vascular crowding and atelectasis. Pulmonary scarring changes. Electronically Signed   By: Marijo Sanes M.D.   On: 05/07/2019 15:48    Procedures .Critical Care Performed by: Kinnie Feil, PA-C Authorized by: Kinnie Feil, PA-C   Critical care provider statement:    Critical care time (minutes):  45   Critical care was necessary to treat or prevent imminent or life-threatening deterioration of the following conditions:  Renal failure   Critical care was time spent personally by me on the following activities:  Discussions with consultants, evaluation of  patient's response to treatment, examination of patient, ordering and performing treatments and interventions, ordering and review of laboratory studies, ordering and review of radiographic studies, pulse oximetry, re-evaluation of patient's condition, obtaining history from patient or surrogate, review of old charts and development of treatment plan with patient or surrogate   I assumed direction of critical care for this patient from another provider in my specialty: no     (including critical care time)  Medications Ordered in ED Medications  sodium chloride 0.9 % bolus 250 mL (0 mLs Intravenous Stopped 05/26/2019 1544)  ceFEPIme (MAXIPIME) 2 g in sodium chloride 0.9 % 100 mL IVPB ( Intravenous Stopped 05/23/2019 1602)  metroNIDAZOLE (FLAGYL) IVPB 500 mg ( Intravenous Stopped 05/15/2019 1718)  vancomycin (VANCOCIN) IVPB 1000 mg/200 mL premix ( Intravenous Stopped 05/10/2019 1718)  sodium chloride 0.9 % bolus 1,000 mL (1,000 mLs Intravenous New Bag/Given 05/11/2019 1732)    ED Course  I have reviewed the triage vital signs and the nursing notes.  Pertinent labs & imaging results that were available during my care of the patient were reviewed by me and considered in my medical decision making (see chart for details).  Clinical Course as of May 18 1800  Mon May 19, 2019  1500 Obtained  more information from daughter [name] who states patient was diagnosed with COVID-19 1 to 2 weeks ago but unsure of exact date.  Facility staff called her today and told her patient was "dehydrated" and his breathing was more labored.  He typically walks pushing a walker or his wheelchair but states this morning he was not able to stand.  At baseline patient is nonverbal and is very hard of hearing.  [name] POA confirms patient is full code and she is agreeable with labs, imaging, IVF, antibiotics, cardiac and pulmonary resuscitation including compression, medicines and intubation/ventilator support.   [CG]  1644 The heart  is within normal limits and size given the AP projection. There is mild tortuosity of the thoracic aorta. Low lung volumes with vascular crowding and streaky basilar atelectasis. No definite pleural effusions. Pulmonary scarring changes.    DG Chest Port 1 View [CG]  2585 Previously < 2, 1 L IVF ordered   Creatinine(!): 4.09 [CG]  1644 BUN(!): 109 [CG]  1644 Glucose(!): 248 [CG]  1644 Anion gap(!): 20 [CG]  1644 GFR, Est Non African American(!): 12 [CG]  1644 1 L IVF ordered   Lactic Acid, Venous(!!): 2.9 [CG]  1644 WBC(!): 11.3 [CG]  1644 pH, Ven(!): 7.449 [CG]  1644 pCO2, Ven: 44.1 [CG]  1644 D-Dimer, Quant(!): 15.51 [CG]  1713 COVID results faxed, detected on 12/14.    [CG]    Clinical Course User Index [CG] Kinnie Feil, PA-C   MDM Rules/Calculators/A&P                      Pt arrives hypothermic, tachycardic, tachypnic and borderline SpO2 89-93% on RA at rest.  Appears ill, clinically dehydrated. Carries diagnosis of COVID but unknown date.     Spoke to family early on to confirm goals of care.  Per family report he is non verbal but can ambulate slowly with walker/wheelchair.  Daughter POA confirms full code.   Meets SIRS criteria with source likely COVID/respiratory.  Significant purulent urethral meatus discharge noted, UTI also possible. Will initiate antibiotics, gentle IVF hydration given borderline pulse ox and h/o HF, diagnosis of COVID, pending CXR. He is HD stable, labs pending at this time. Will closely monitor.  1700: Pt re-evaluated, tachycardia improving and HD stable otherwise.  Facility faxed COVID results confirmed positive 12/14.   ER work up personally reviewed as above.   Labs consistent with COVID illness including inflammatory markers and d-dimer 15.1.  CXR non acute with atelectasis and scarring.   Mild leukocytosis with lactic acidosis 2.9, hyperglycemia, hypernatremia likely from hyperglycemia/dehydration, AKI. VBG with pH 7.45 normal Co2,  elevated bicarb and O2. Flu negative.   1 L IVF ordered for lactic acidosis, hyperglycemia and no edema on CXR.  1725: No HD instability at this time.  Broad spectrum abx, IVF ordered. Will consult for admission for AKI, dehydration, COVID illness.  Pending UA.   1800: Spoke to hospitalist who will admit. Pending UA. Lactic acid normalized.  Final Clinical Impression(s) / ED Diagnoses Final diagnoses:  AKI (acute kidney injury) [facility])    Rx / DC Orders ED Discharge Orders    None       Kinnie Feil, PA-C 05/17/2019 1801    Margette Fast, MD 05/08/2019 1930

## 2019-05-20 ENCOUNTER — Inpatient Hospital Stay (HOSPITAL_COMMUNITY): Payer: Medicare HMO

## 2019-05-20 DIAGNOSIS — Z515 Encounter for palliative care: Secondary | ICD-10-CM

## 2019-05-20 DIAGNOSIS — N189 Chronic kidney disease, unspecified: Secondary | ICD-10-CM

## 2019-05-20 DIAGNOSIS — N179 Acute kidney failure, unspecified: Secondary | ICD-10-CM | POA: Diagnosis present

## 2019-05-20 DIAGNOSIS — Z7189 Other specified counseling: Secondary | ICD-10-CM

## 2019-05-20 DIAGNOSIS — E870 Hyperosmolality and hypernatremia: Secondary | ICD-10-CM | POA: Diagnosis present

## 2019-05-20 DIAGNOSIS — U071 COVID-19: Secondary | ICD-10-CM | POA: Diagnosis present

## 2019-05-20 DIAGNOSIS — G9341 Metabolic encephalopathy: Secondary | ICD-10-CM

## 2019-05-20 LAB — BLOOD GAS, ARTERIAL
Acid-Base Excess: 1.9 mmol/L (ref 0.0–2.0)
Bicarbonate: 26.5 mmol/L (ref 20.0–28.0)
Drawn by: 317771
FIO2: 21
O2 Saturation: 96.5 %
Patient temperature: 37.4
pCO2 arterial: 34.8 mmHg (ref 32.0–48.0)
pH, Arterial: 7.474 — ABNORMAL HIGH (ref 7.350–7.450)
pO2, Arterial: 87 mmHg (ref 83.0–108.0)

## 2019-05-20 LAB — CBC WITH DIFFERENTIAL/PLATELET
Abs Immature Granulocytes: 0.08 10*3/uL — ABNORMAL HIGH (ref 0.00–0.07)
Basophils Absolute: 0.1 10*3/uL (ref 0.0–0.1)
Basophils Relative: 1 %
Eosinophils Absolute: 0 10*3/uL (ref 0.0–0.5)
Eosinophils Relative: 0 %
HCT: 46.2 % (ref 39.0–52.0)
Hemoglobin: 14.3 g/dL (ref 13.0–17.0)
Immature Granulocytes: 1 %
Lymphocytes Relative: 3 %
Lymphs Abs: 0.5 10*3/uL — ABNORMAL LOW (ref 0.7–4.0)
MCH: 28.5 pg (ref 26.0–34.0)
MCHC: 31 g/dL (ref 30.0–36.0)
MCV: 92 fL (ref 80.0–100.0)
Monocytes Absolute: 0.6 10*3/uL (ref 0.1–1.0)
Monocytes Relative: 4 %
Neutro Abs: 14.6 10*3/uL — ABNORMAL HIGH (ref 1.7–7.7)
Neutrophils Relative %: 91 %
Platelets: 203 10*3/uL (ref 150–400)
RBC: 5.02 MIL/uL (ref 4.22–5.81)
RDW: 15.3 % (ref 11.5–15.5)
WBC: 15.9 10*3/uL — ABNORMAL HIGH (ref 4.0–10.5)
nRBC: 0 % (ref 0.0–0.2)

## 2019-05-20 LAB — COMPREHENSIVE METABOLIC PANEL
ALT: 58 U/L — ABNORMAL HIGH (ref 0–44)
AST: 61 U/L — ABNORMAL HIGH (ref 15–41)
Albumin: 2.6 g/dL — ABNORMAL LOW (ref 3.5–5.0)
Alkaline Phosphatase: 98 U/L (ref 38–126)
Anion gap: 19 — ABNORMAL HIGH (ref 5–15)
BUN: 120 mg/dL — ABNORMAL HIGH (ref 8–23)
CO2: 24 mmol/L (ref 22–32)
Calcium: 8.6 mg/dL — ABNORMAL LOW (ref 8.9–10.3)
Chloride: 114 mmol/L — ABNORMAL HIGH (ref 98–111)
Creatinine, Ser: 4.58 mg/dL — ABNORMAL HIGH (ref 0.61–1.24)
GFR calc Af Amer: 12 mL/min — ABNORMAL LOW (ref >60)
GFR calc non Af Amer: 10 mL/min — ABNORMAL LOW (ref >60)
Glucose, Bld: 289 mg/dL — ABNORMAL HIGH (ref 70–99)
Potassium: 3.1 mmol/L — ABNORMAL LOW (ref 3.5–5.1)
Sodium: 157 mmol/L — ABNORMAL HIGH (ref 135–145)
Total Bilirubin: 1.5 mg/dL — ABNORMAL HIGH (ref 0.3–1.2)
Total Protein: 6.1 g/dL — ABNORMAL LOW (ref 6.5–8.1)

## 2019-05-20 LAB — C-REACTIVE PROTEIN: CRP: 5.5 mg/dL — ABNORMAL HIGH (ref <1.0)

## 2019-05-20 LAB — PHOSPHORUS: Phosphorus: 5.3 mg/dL — ABNORMAL HIGH (ref 2.5–4.6)

## 2019-05-20 LAB — GLUCOSE, CAPILLARY
Glucose-Capillary: 278 mg/dL — ABNORMAL HIGH (ref 70–99)
Glucose-Capillary: 228 mg/dL — ABNORMAL HIGH (ref 70–99)
Glucose-Capillary: 148 mg/dL — ABNORMAL HIGH (ref 70–99)
Glucose-Capillary: 156 mg/dL — ABNORMAL HIGH (ref 70–99)

## 2019-05-20 LAB — FIBRINOGEN: Fibrinogen: 401 mg/dL (ref 210–475)

## 2019-05-20 LAB — APTT: aPTT: 24 seconds (ref 24–36)

## 2019-05-20 LAB — HEPARIN LEVEL (UNFRACTIONATED)
Heparin Unfractionated: <0.1 IU/mL — ABNORMAL LOW (ref 0.30–0.70)
Heparin Unfractionated: 1.28 IU/mL — ABNORMAL HIGH (ref 0.30–0.70)

## 2019-05-20 LAB — FERRITIN: Ferritin: 213 ng/mL (ref 24–336)

## 2019-05-20 LAB — LACTATE DEHYDROGENASE: LDH: 150 U/L (ref 98–192)

## 2019-05-20 LAB — MAGNESIUM: Magnesium: 2.9 mg/dL — ABNORMAL HIGH (ref 1.7–2.4)

## 2019-05-20 LAB — TSH: TSH: 0.286 u[IU]/mL — ABNORMAL LOW (ref 0.350–4.500)

## 2019-05-20 LAB — T4, FREE: Free T4: 1.33 ng/dL — ABNORMAL HIGH (ref 0.61–1.12)

## 2019-05-20 LAB — D-DIMER, QUANTITATIVE: D-Dimer, Quant: 9.92 ug/mL-FEU — ABNORMAL HIGH (ref 0.00–0.50)

## 2019-05-20 MED ORDER — POTASSIUM CHLORIDE 10 MEQ/100ML IV SOLN
10.0000 meq | INTRAVENOUS | Status: AC
  Administered 2019-05-20 (×2): 10 meq via INTRAVENOUS
  Filled 2019-05-20 (×2): qty 100

## 2019-05-20 MED ORDER — HEPARIN SODIUM (PORCINE) 5000 UNIT/ML IJ SOLN
5000.0000 [IU] | Freq: Three times a day (TID) | INTRAMUSCULAR | Status: DC
  Administered 2019-05-20 – 2019-05-21 (×3): 5000 [IU] via SUBCUTANEOUS
  Filled 2019-05-20 (×4): qty 1

## 2019-05-20 MED ORDER — SODIUM CHLORIDE 0.45 % IV SOLN: INTRAVENOUS | Status: DC

## 2019-05-20 MED ORDER — HEPARIN (PORCINE) 25000 UT/250ML-% IV SOLN
800.0000 [IU]/h | INTRAVENOUS | Status: DC
  Administered 2019-05-20: 1100 [IU]/h via INTRAVENOUS
  Filled 2019-05-20: qty 250

## 2019-05-20 MED ORDER — SODIUM CHLORIDE 0.9 % IV SOLN
1.0000 g | INTRAVENOUS | Status: DC
  Administered 2019-05-20: 1 g via INTRAVENOUS
  Filled 2019-05-20 (×2): qty 1

## 2019-05-20 MED ORDER — SODIUM CHLORIDE 0.9 % IV BOLUS
500.0000 mL | Freq: Once | INTRAVENOUS | Status: AC
  Administered 2019-05-20: 500 mL via INTRAVENOUS

## 2019-05-20 NOTE — Consult Note (Signed)
Consultation Note Date: 05/20/2019   Patient Name: [name]  DOB: 07/01/1924  MRN: [id number]  Age / Sex: 85 y.o., male  PCP: Janora Norlander, DO Referring Physician: Rodena Goldmann, DO  Reason for Consultation: Establishing goals of care  HPI/Patient Profile: 85 y.o. male  with past medical history of advanced dementia, CAD, CKD 3, DM2, HTN  admitted on 05/04/2019 with acute metabolic encephalopathy. He is COVID positive, has stopped eating and drinking. He as acute on chronic kidney injury that is slowly improving with IV fluids. He is very somnolent- sleeping more than awake. Palliative medicine consulted for goals of care.   Clinical Assessment and Goals of Care: Received report from patient's RN regarding his status.  Called his daughterVickii Chafe and discussed goals of care.  Patient previously worked as a Psychologist, sport and exercise. Prior to his onset of dementia he enjoyed working on things such as Gaffer.  I reviewed patient's current status and possible trajectories with [name].  We discussed the fact that he is not eating or drinking- and although IV fluids are temporarily fixing his kidneys and keeping him alive now- once they are stopped he would likely decline quickly.  Options of continued aggressive care vs transition to full comfort measures was discussed. [name] stated that if patient could seem himself as he was now- he would say to transition to comfort measures and allow for natural dying process with comfort.  [name] notes that she had discussion with primary MD earlier who recommended continuing care until Friday. We discussed this is of course an option- also recommended [name] consider what patient's preference would be if he could tell us.   Primary Decision Maker NEXT OF KIN- patient's daughter- [name]    SUMMARY OF RECOMMENDATIONS -Continue current care- [name] is considering transition to  comfort- she is very reliant on recommendations from attending MD -PMT contact information given to [name]- she will call if she has questions or makes a decision    Code Status/Advance Care Planning:  DNR  Palliative Prophylaxis:   Delirium Protocol  Additional Recommendations (Limitations, Scope, Preferences):  Full Scope Treatment  Prognosis:    Unable to determine  Discharge Planning: To Be Determined  Primary Diagnoses: Present on Admission: . Acute metabolic encephalopathy . COVID-19 virus infection . Dehydration . AKI (acute kidney injury) ([place]) . Hypokalemia . Hypernatremia   I have reviewed the medical record, interviewed the patient and family, and examined the patient. The following aspects are pertinent.  Past Medical History:  Diagnosis Date  . Aortic insufficiency   . Aortic valve sclerosis   . Bronchitis   . CHF (congestive heart failure) ([place])   . Dementia ([place])   . Diabetes mellitus    Diet control   . Diverticulosis   . Dyslipidemia   . GERD (gastroesophageal reflux disease)   . Hard of hearing   . Hemorrhoids   . Hernia cerebri ([place])   . History of pneumonia   . Hypertension   . Hypothyroidism   . MI (myocardial infarction) (  [place]) 1996   percutaneous coronary intervention   . Small bowel obstruction ([facility])    Social History   Socioeconomic History  . Marital status: Married    Spouse name: Not on file  . Number of children: Not on file  . Years of education: Not on file  . Highest education level: Not on file  Occupational History  . Occupation: Retired  Tobacco Use  . Smoking status: Former Smoker    Quit date: 08/09/1942    Years since quitting: 76.8  . Smokeless tobacco: Never Used  Substance and Sexual Activity  . Alcohol use: No  . Drug use: No  . Sexual activity: Never  Other Topics Concern  . Not on file  Social History Narrative  . Not on file   Social Determinants of Health   Financial Resource Strain:   .  Difficulty of Paying Living Expenses: Not on file  Food Insecurity:   . Worried About Charity fundraiser in the Last Year: Not on file  . Ran Out of Food in the Last Year: Not on file  Transportation Needs:   . Lack of Transportation (Medical): Not on file  . Lack of Transportation (Non-Medical): Not on file  Physical Activity:   . Days of Exercise per Week: Not on file  . Minutes of Exercise per Session: Not on file  Stress:   . Feeling of Stress : Not on file  Social Connections:   . Frequency of Communication with Friends and Family: Not on file  . Frequency of Social Gatherings with Friends and Family: Not on file  . Attends Religious Services: Not on file  . Active Member of Clubs or Organizations: Not on file  . Attends Archivist Meetings: Not on file  . Marital Status: Not on file   Family History  Problem Relation Age of Onset  . Colon cancer Neg Hx    Scheduled Meds: . heparin injection (subcutaneous)  5,000 Units Subcutaneous Q8H  . insulin aspart  0-6 Units Subcutaneous TID WC  . mouth rinse  15 mL Mouth Rinse BID  . sodium chloride flush  3 mL Intravenous Q12H   Continuous Infusions: . sodium chloride 100 mL/hr at 05/20/19 1007  . ceFEPime (MAXIPIME) IV     PRN Meds:.acetaminophen **OR** acetaminophen Medications Prior to Admission:  Prior to Admission medications   Medication Sig Start Date End Date Taking? Authorizing Provider  albuterol (PROVENTIL) (2.5 MG/3ML) 0.083% nebulizer solution Take 3 mLs (2.5 mg total) by nebulization every 6 (six) hours as needed for wheezing or shortness of breath. 07/02/14   Chipper Herb, MD  aspirin 81 MG tablet Take 81 mg by mouth daily.    [provider]  Blood Glucose Monitoring Suppl (ACCU-CHEK AVIVA PLUS) w/Device KIT USE  AS  INSTRUCTED 07/05/17   Timmothy Euler, MD  cholecalciferol (VITAMIN D) 1000 units tablet Take 1,000 Units by mouth daily.    [provider]  clotrimazole-betamethasone  Donalynn Furlong) cream  04/26/17   [provider]  Diaper Rash Products (DESITIN EX) Apply 1 application topically daily.    [provider]  docusate sodium (COLACE) 100 MG capsule Take 1 capsule (100 mg total) by mouth at bedtime. 11/01/15   Claretta Fraise, MD  fluticasone (FLONASE) 50 MCG/ACT nasal spray Place 1 spray into both nostrils 2 (two) times daily as needed for allergies or rhinitis. 02/26/18   Janora Norlander, DO  furosemide (LASIX) 40 MG tablet Take 1 tablet (  40 mg total) by mouth 2 (two) times daily. 03/18/19   Janora Norlander, DO  gabapentin (NEURONTIN) 300 MG capsule Take 300 mg by mouth at bedtime. 08/27/18   [provider]  glucose blood (ACCU-CHEK AVIVA) test strip 1 each by Other route daily. Use as instructed 04/23/17   Timmothy Euler, MD  JANUVIA 50 MG tablet TAKE 1 TABLET DAILY 03/18/19   Ronnie Doss M, DO  ketoconazole (NIZORAL) 2 % shampoo Apply 1 application topically 2 (two) times a week. x2-3 weeks 12/19/18   Janora Norlander, DO  Lancet Devices (ACCU-CHEK [place]) lancets 1 each by Other route daily. 04/23/17   Timmothy Euler, MD  levothyroxine (SYNTHROID) 88 MCG tablet TAKE 1 TABLET DAILY 03/04/19   Ronnie Doss M, DO  meclizine (ANTIVERT) 25 MG tablet Take 1 tablet (25 mg total) by mouth 3 (three) times daily as needed for dizziness. 07/19/16   Terald Sleeper, PA-C  metoprolol succinate (TOPROL-XL) 25 MG 24 hr tablet TAKE 1 TABLET DAILY 03/04/19   Ronnie Doss M, DO  mirtazapine (REMERON) 7.5 MG tablet Take 1 tablet (7.5 mg total) by mouth at bedtime. 11/15/18   Janora Norlander, DO  Natural Senna Laxative 64.8-324 MG TABS Take 1 tablet by mouth at bedtime.     [provider]  Omega-3 Fatty Acids (FISH OIL) 1000 MG CAPS Take 1 capsule by mouth daily.    [provider]  omeprazole (PRILOSEC) 20 MG capsule TAKE (1) CAPSULE DAILY 02/26/18   Ronnie Doss M, DO  potassium chloride (K-DUR) 10 MEQ  tablet Take 1 tablet (10 mEq total) by mouth 2 (two) times daily. As a potassium supplement 12/26/18   Ronnie Doss M, DO  simvastatin (ZOCOR) 20 MG tablet TAKE 1 TABLET EVERY DAY 03/04/19   Janora Norlander, DO   No Known Allergies Review of Systems  Physical Exam  Vital Signs: BP (!) 96/59 (BP Location: Right Arm)   Pulse (!) 101   Temp (!) 97.4 F (36.3 C) (Oral)   Resp (!) 22   Ht _0  (1.803 m)   Wt 68.4 kg   SpO2 92%   BMI 21.03 kg/m  Pain Scale: Faces       SpO2: SpO2: 92 % O2 Device:SpO2: 92 % O2 Flow Rate: .   IO: Intake/output summary:   Intake/Output Summary (Last 24 hours) at 05/20/2019 1625 Last data filed at 05/20/2019 1500 Gross per 24 hour  Intake 874.41 ml  Output -  Net 874.41 ml    LBM: Last BM Date: (unsure; pt poor historian) Baseline Weight: Weight: 68 kg Most recent weight: Weight: 68.4 kg     Palliative Assessment/Data: PPS: 10%    The above conversation was completed via telephone due to the visitor restrictions during the COVID-19 pandemic. Thorough chart review and discussion with necessary members of the care team was completed as part of assessment. All issues were discussed and addressed but no physical exam was performed.    Thank you for this consult. Palliative medicine will continue to follow and assist as needed.   Time In:1600 Time Out: 1715 Time Total: 75 mins Greater than 50%  of this time was spent counseling and coordinating care related to the above assessment and plan.  Signed by: Mariana Kaufman, AGNP-C Palliative Medicine    Please contact Palliative Medicine Team phone at [phone] for questions and concerns.  For individual provider: See Shea Evans

## 2019-05-20 NOTE — Progress Notes (Signed)
Brief note regarding plan, with full H&P to follow:  85 year old male with history of advanced dementia, stage III CKD baseline creatinine of 1.4-1.6, DM2, who is admitted with acute metabolic encephalopathy in the setting of COVID-19 after presenting from SNF for further evaluation of increased somnolence, generalized weakness, and diminished oral intake.  Presentation associated with acute kidney injury superimposed on chronic kidney disease in the setting of suspected dehydration.  Receiving gentle IV fluids.  Presenting D-dimer elevated to 15, prompting initiation of intermediate dose subcutaneous heparin in setting of underlying COVID-19.  Oxygen saturations have been in the mid 90s on room air, without any evidence of associated respiratory distress. Will follow for result of UA.    Babs Bertin, DO Hospitalist

## 2019-05-20 NOTE — TOC Initial Note (Addendum)
Transition of Care [facility]) - Initial/Assessment Note    Patient Details  Name: [name] MRN: [id number] Date of Birth: [birth year]  [facility]) CM/SW Contact:    Ihor Gully, LCSW Phone Number: 05/20/2019, 2:22 PM  Clinical Narrative:                 Patient from [place]. Admitted for Acute metabolic encephalopathy. Covid+. Has been at facility since 03/21/2019 in [place] unit. Uses wheelchair. Max assist for bathing, grooming, dressing and toileting. Feeds self.  Can return to facility at discharge.  Palliative consulted. TOC following.   Expected Discharge Plan: Assisted Living Barriers to Discharge: Continued Medical Work up   Patient Goals and CMS Choice        Expected Discharge Plan and Services Expected Discharge Plan: Assisted Living       Living arrangements for the past 2 months: [place]                                      Prior Living Arrangements/Services Living arrangements for the past 2 months: [place] Lives with:: Facility Resident Patient language and need for interpreter reviewed:: Yes Do you feel safe going back to the place where you live?: Yes      Need for Family Participation in Patient Care: Yes (Comment) Care giver support system in place?: Yes (comment) Current home services: DME Criminal Activity/Legal Involvement Pertinent to Current Situation/Hospitalization: No - Comment as needed  Activities of Daily Living      Permission Sought/Granted Permission sought to share information with : Biomedical scientist granted to share info w Relationship: Juliann Pulse at [employer] Appearance:: Appears stated age   Affect (typically observed): Unable to Assess   Alcohol / Substance Use: Not Applicable Psych Involvement: No (comment)  Admission diagnosis:  AKI (acute kidney injury) ([place]) 123456 Acute  metabolic encephalopathy [id number] Patient Active Problem List   Diagnosis Date Noted  . COVID-19 virus infection 05/20/2019  . AKI (acute kidney injury) ([place]) 05/20/2019  . Hypernatremia 05/20/2019  . Acute metabolic encephalopathy [id number]  . Pressure injury of skin 02/18/2017  . Generalized weakness 02/16/2017  . Hypokalemia 02/14/2017  . Vertigo 02/13/2017  . CKD (chronic kidney disease), stage III 02/13/2017  . Chronic diastolic CHF (congestive heart failure) ([place]) 02/13/2017  . Orthostasis 02/13/2017  . Eczema 08/31/2015  . Ascites 08/31/2015  . Right hip pain 08/10/2015  . Diabetic polyneuropathy associated with type 2 diabetes mellitus ([place]) 08/10/2015  . Dehydration 08/10/2015  . Alzheimer's dementia ([place]) 08/10/2015  . Hypothyroid 12/04/2012  . Chronic back pain 12/04/2012  . Hard of hearing 09/03/2012  . Aortic insufficiency 09/03/2012  . Vitamin D deficiency 09/03/2012  . Arthritis 09/03/2012  . Insomnia 09/03/2012  . Iron deficiency anemia, unspecified 07/31/2011  . Diabetes mellitus type II, non insulin dependent ([place]) 11/30/2009  . Hyperlipidemia 11/30/2009  . Essential hypertension 11/30/2009  . CORONARY ATHEROSCLEROSIS NATIVE CORONARY ARTERY 11/30/2009   PCP:  Janora Norlander, DO Pharmacy:   [facility], [place] [address] Phone: [phone] Fax: [facility] [place], [place] [address] Phone: [phone] Fax: [phone]  Social Determinants of Health (SDOH) Interventions    Readmission Risk Interventions No flowsheet data found.

## 2019-05-20 NOTE — Progress Notes (Signed)
PROGRESS NOTE    [name]  [id number] DOB: 17-Apr-1925 DOA: 05/01/2019 PCP: Janora Norlander, DO   Brief Narrative:  Per HPI: [name] is a 85 y.o. male with medical history significant for advanced dementia, non-verbal at baseline, coronary artery disease status post PCI in 1996, stage III chronic kidney disease with baseline creatinine of 1.4-1.6, type 2 diabetes mellitus, hypertension, acquired hypothyroidism, who is admitted to [facility] on 05/01/2019 with acute metabolic encephalopathy after presenting from SNF to [facility] emergency department for further evaluation of increased somnolence.  In the setting of the patient's advanced dementia, the following history is provided by the patient's daughter [name] [facility]), my discussions with the emergency department physician, and via chart review.  The patient has reportedly been demonstrating to 3 days of increased somnolence, sleeping several hours more relative to baseline sleep requirements.  This period time is also been associated with diminished oral intake.  Not associated with any vomiting or diarrhea.  Additionally, SNF staff has reportedly noted generalized weakness over the last week.  Specifically, the patient reportedly ambulates with a walker at baseline, but in the setting of progressive generalized weakness over the last week, has been too weak to ambulate at all.  Outpatient COVID-19 testing performed on 05/12/2019 was found to be positive, and confirmed via FAX sent by [facility] to [facility] emergency department this evening.  The patient has a distant smoking history, having reportedly he completely quit smoking in the 1940s, and possesses no known chronic underlying pulmonary conditions.  Per discussions with the patient's POA, the patient is to be full code.   12/22: Patient was admitted with acute metabolic encephalopathy in the setting of advanced dementia and likely related to AKI on CKD  stage III.  He also may have a UTI and has been started on cefepime empirically.  He continues to remain encephalopathic and I have had discussion with his daughter who is now agreeable to DNR.  Palliative care consultation ordered and pending.  Continue IV fluids and monitor labs.  Further work-up of AKI pending.  Avoid nephrology consultation for now.  Assessment & Plan:   Principal Problem:   Acute metabolic encephalopathy Active Problems:   Dehydration   Hypokalemia   COVID-19 virus infection   AKI (acute kidney injury) (HCC)   Hypernatremia   Acute metabolic encephalopathy in the setting of advanced dementia -Continue to monitor closely, but is likely related to AKI -Appreciate palliative care evaluation for goals of care and likely hospice potentially by discharge  AKI on CKD stage III (baseline creatinine 1.4-1.6) -Continues to have elevated creatinine -Monitor while on half-normal saline IV fluid -Avoid nephrotoxic agents -Strict I's and O's  Hypernatremia -Half-normal saline and monitor repeat labs  COVID-19 infection -Currently on room air with no need for steroids or remdesivir -Maintain on isolation precautions  Severe sepsis possibly related to UTI -Continue on cefepime -Check urine analysis and urine culture once this can be obtained -No growth on blood cultures noted thus far -Repeat CBC and lactic in a.m.  Hypokalemia -Continue to replete IV and monitor on labs  Type 2 diabetes -Currently with some hyperglycemia  Hypothyroidism -TSH noted to be low will check free T4 -Hold home thyroid medication for now  DVT prophylaxis: Change heparin drip to subcu heparin Code Status: DNR Family Communication: Discussed with daughter on phone Disposition Plan: Continue on IV fluid and monitor labs.  Palliative evaluation for goals of care and likely need for  hospice care soon.   Consultants:   Palliative care  Procedures:   None  Antimicrobials:   Anti-infectives (From admission, onward)   Start     Dose/Rate Route Frequency Ordered Stop   05/20/19 1600  ceFEPIme (MAXIPIME) 500 mg in dextrose 5 % 50 mL IVPB  Status:  Discontinued     500 mg 100 mL/hr over 30 Minutes Intravenous Every 24 hours 05/04/2019 1951 05/20/19 1026   05/20/19 1600  ceFEPIme (MAXIPIME) 1 g in sodium chloride 0.9 % 100 mL IVPB     1 g 200 mL/hr over 30 Minutes Intravenous Every 24 hours 05/20/19 1027     05/15/2019 1515  ceFEPIme (MAXIPIME) 2 g in sodium chloride 0.9 % 100 mL IVPB     2 g 200 mL/hr over 30 Minutes Intravenous  Once 05/15/2019 1508 05/06/2019 1602   05/04/2019 1515  metroNIDAZOLE (FLAGYL) IVPB 500 mg     500 mg 100 mL/hr over 60 Minutes Intravenous  Once 05/16/2019 1508 05/14/2019 1718   05/21/2019 1515  vancomycin (VANCOCIN) IVPB 1000 mg/200 mL premix     1,000 mg 200 mL/hr over 60 Minutes Intravenous  Once 05/04/2019 1508 05/14/2019 1718       Subjective: Patient seen and evaluated today and continues to remain somnolent and mostly unresponsive.  Currently on room air.  Objective: Vitals:   05/20/19 0139 05/20/19 0225 05/20/19 0413 05/20/19 0416  BP: (!) 88/57 (!) 90/58 (!) 96/59   Pulse:   (!) 101   Resp:   (!) 22   Temp:      TempSrc:      SpO2:   92%   Weight:    68.4 kg  Height:        Intake/Output Summary (Last 24 hours) at 05/20/2019 1028 Last data filed at 05/21/2019 1732 Gross per 24 hour  Intake 636.72 ml  Output --  Net 636.72 ml   Filed Weights   04/30/2019 1743 04/30/2019 2221 05/20/19 0416  Weight: 68 kg 66.4 kg 68.4 kg    Examination:  General exam: Appears somnolent Respiratory system: Clear to auscultation. Respiratory effort normal.  Currently on room air. Cardiovascular system: S1 & S2 heard, RRR. No JVD, murmurs, rubs, gallops or clicks. No pedal edema. Gastrointestinal system: Abdomen is nondistended, soft and nontender. No organomegaly or masses felt. Normal bowel sounds heard. Central nervous system: Somnolent and  minimally responsive. Extremities: No significant edema Skin: No rashes, lesions or ulcers Psychiatry: Cannot be determined based on patient evaluation.    Data Reviewed: I have personally reviewed following labs and imaging studies  CBC: Recent Labs  Lab 05/18/2019 1527 05/20/19 0600  WBC 11.3* 15.9*  NEUTROABS 9.3* 14.6*  HGB 16.7 14.3  HCT 53.8* 46.2  MCV 92.8 92.0  PLT 250 [id number]   Basic Metabolic Panel: Recent Labs  Lab 05/26/2019 1527 05/20/19 0600  NA 157* 157*  K 3.0* 3.1*  CL 110 114*  CO2 27 24  GLUCOSE 248* 289*  BUN 109* 120*  CREATININE 4.09* 4.58*  CALCIUM 9.5 8.6*  MG 3.2* 2.9*  PHOS  --  5.3*   GFR: Estimated Creatinine Clearance: 10 mL/min (A) (by C-G formula based on SCr of 4.58 mg/dL (H)). Liver Function Tests: Recent Labs  Lab 05/14/2019 1527 05/20/19 0600  AST 85* 61*  ALT 66* 58*  ALKPHOS 124 98  BILITOT 2.0* 1.5*  PROT 8.0 6.1*  ALBUMIN 3.5 2.6*   No results for input(s): LIPASE, AMYLASE in the last 168 hours. No  results for input(s): AMMONIA in the last 168 hours. Coagulation Profile: No results for input(s): INR, PROTIME in the last 168 hours. Cardiac Enzymes: No results for input(s): CKTOTAL, CKMB, CKMBINDEX, TROPONINI in the last 168 hours. BNP (last 3 results) No results for input(s): PROBNP in the last 8760 hours. HbA1C: No results for input(s): HGBA1C in the last 72 hours. CBG: Recent Labs  Lab 05/27/2019 2322 05/20/19 0751  GLUCAP 207* 278*   Lipid Profile: Recent Labs    05/17/2019 1529  TRIG 316*   Thyroid Function Tests: Recent Labs    05/20/19 0100  TSH 0.286*   Anemia Panel: Recent Labs    05/26/2019 1529 05/20/19 0600  FERRITIN 260 213   Sepsis Labs: Recent Labs  Lab 05/11/2019 1525 04/29/2019 1529 05/29/2019 1704  PROCALCITON  --  0.13  --   LATICACIDVEN 2.9*  --  1.6    Recent Results (from the past 240 hour(s))  Blood Culture (routine x 2)     Status: None (Preliminary result)   Collection Time:  04/30/2019  3:27 PM   Specimen: Right Antecubital; Blood  Result Value Ref Range Status   Specimen Description RIGHT ANTECUBITAL  Final   Special Requests   Final    BOTTLES DRAWN AEROBIC AND ANAEROBIC Blood Culture adequate volume   Culture   Final    NO GROWTH < 24 HOURS Performed at [facility], [address] [place], [place] [address]    Report Status PENDING  Incomplete  Blood Culture (routine x 2)     Status: None (Preliminary result)   Collection Time: 05/24/2019  3:28 PM   Specimen: BLOOD LEFT WRIST  Result Value Ref Range Status   Specimen Description BLOOD LEFT WRIST  Final   Special Requests   Final    BOTTLES DRAWN AEROBIC AND ANAEROBIC Blood Culture adequate volume   Culture   Final    NO GROWTH < 24 HOURS Performed at [facility], [address] [place], [place] [address]    Report Status PENDING  Incomplete         Radiology Studies: DG Chest Port 1 View  Result Date: 05/28/2019 CLINICAL DATA:  Weakness and shortness of breath. EXAM: PORTABLE CHEST 1 VIEW COMPARISON:  02/16/2017 FINDINGS: The heart is within normal limits and size given the AP projection. There is mild tortuosity of the thoracic aorta. Low lung volumes with vascular crowding and streaky basilar atelectasis. No definite pleural effusions. Pulmonary scarring changes. IMPRESSION: Low lung volumes with vascular crowding and atelectasis. Pulmonary scarring changes. Electronically Signed   By: Marijo Sanes M.D.   On: 05/26/2019 15:48        Scheduled Meds: . heparin injection (subcutaneous)  5,000 Units Subcutaneous Q8H  . insulin aspart  0-6 Units Subcutaneous TID WC  . mouth rinse  15 mL Mouth Rinse BID  . sodium chloride flush  3 mL Intravenous Q12H   Continuous Infusions: . sodium chloride 100 mL/hr at 05/20/19 1007  . ceFEPime (MAXIPIME) IV    . potassium chloride 10 mEq (05/20/19 1008)     LOS: 1 day    Time spent: 30 minutes    Montel Vanderhoof Darleen Crocker, DO [facility] Pager  [phone]  If 7PM-7AM, please contact night-coverage [email or web address] Password [facility] 05/20/2019, 10:28 AM

## 2019-05-20 NOTE — Progress Notes (Addendum)
[name] for heparin infusion Indication: r/o PE     No Known Allergies  Patient Measurements: Height: 5\' 11"  (180.3 cm) Weight: 150 lb 12.7 oz (68.4 kg) IBW/kg (Calculated) : 75.3 Heparin Dosing Weight: HEPARIN DW (KG): 66.4  Vital Signs: BP: 96/59 (12/22 0413) Pulse Rate: 101 (12/22 0413)  Labs: Recent Labs    05/07/2019 1527 05/20/19 0100 05/20/19 0600 05/20/19 0908  HGB 16.7  --  14.3  --   HCT 53.8*  --  46.2  --   PLT 250  --  203  --   APTT  --  24  --   --   HEPARINUNFRC  --  <0.10*  --  1.28*  CREATININE 4.09*  --  4.58*  --     Estimated Creatinine Clearance: 10 mL/min (A) (by C-G formula based on SCr of 4.58 mg/dL (H)).   Medical History: Past Medical History:  Diagnosis Date  . Aortic insufficiency   . Aortic valve sclerosis   . Bronchitis   . CHF (congestive heart failure) ([place])   . Dementia ([place])   . Diabetes mellitus    Diet control   . Diverticulosis   . Dyslipidemia   . GERD (gastroesophageal reflux disease)   . Hard of hearing   . Hemorrhoids   . Hernia cerebri ([place])   . History of pneumonia   . Hypertension   . Hypothyroidism   . MI (myocardial infarction) ([place]) 1996   percutaneous coronary intervention   . Small bowel obstruction [facility])      Assessment: Pharmacy consulted to dose heparin infusion for this 85 yo male admitted with shortness of breath and hypoxemia with known  CoVid diagnosis. His D-Dimer is elevated at 15.5 mcg-mL/FEU. He wasn't on any anti-coagulants PTA and has been receiving heparin 7500 units sub-q tid since admission.  HL 1.28   Goal of Therapy:  Heparin level 0.3-0.7 units/ml Monitor platelets by anticoagulation protocol: Yes   Plan:   Discontinuing heparin drip and starting heparin subcutaneous.  Margot Ables, PharmD Clinical Pharmacist 05/20/2019 10:24 AM

## 2019-05-20 NOTE — Progress Notes (Addendum)
ANTICOAGULATION CONSULT NOTE - Initial Consult  Pharmacy Consult for heparin infusion Indication: r/o PE     No Known Allergies  Patient Measurements: Height: 5\' 11"  (180.3 cm) Weight: 146 lb 6.2 oz (66.4 kg) IBW/kg (Calculated) : 75.3 Heparin Dosing Weight: HEPARIN DW (KG): 66.4  Vital Signs: Temp: 97.4 F (36.3 C) (12/21 2203) Temp Source: Oral (12/21 2203) BP: 80/59 (12/21 2313) Pulse Rate: 107 (12/21 2313)  Labs: Recent Labs    04/30/2019 1527  HGB 16.7  HCT 53.8*  PLT 250  CREATININE 4.09*    Estimated Creatinine Clearance: 10.8 mL/min (A) (by C-G formula based on SCr of 4.09 mg/dL (H)).   Medical History: Past Medical History:  Diagnosis Date  . Aortic insufficiency   . Aortic valve sclerosis   . Bronchitis   . CHF (congestive heart failure) ([place])   . Dementia ([place])   . Diabetes mellitus    Diet control   . Diverticulosis   . Dyslipidemia   . GERD (gastroesophageal reflux disease)   . Hard of hearing   . Hemorrhoids   . Hernia cerebri ([place])   . History of pneumonia   . Hypertension   . Hypothyroidism   . MI (myocardial infarction) ([place]) 1996   percutaneous coronary intervention   . Small bowel obstruction [facility])      Assessment: Pharmacy consulted to dose heparin infusion for this 85 yo male admitted with shortness of breath and hypoxemia with known  CoVid diagnosis. His D-Dimer is elevated at 15.5 mcg-mL/FEU. He wasn't on any anti-coagulants PTA and has been receiving heparin 7500 units sub-q tid since admission.   Goal of Therapy:  Heparin level 0.3-0.7 units/ml Monitor platelets by anticoagulation protocol: Yes   Plan:  Discontinue heparin 7500 units q8h No heparin bolus since patient has last sub-q dose at  2344 on 12/21 Start heparin infusion at 1100 units/hr Check anti-Xa level in 6-8 hours and daily while on heparin Continue to monitor H&H and platelets  Despina Pole 05/20/2019,12:25 AM

## 2019-05-21 LAB — CBC
HCT: 37.3 % — ABNORMAL LOW (ref 39.0–52.0)
Hemoglobin: 11.5 g/dL — ABNORMAL LOW (ref 13.0–17.0)
MCH: 28.9 pg (ref 26.0–34.0)
MCHC: 30.8 g/dL (ref 30.0–36.0)
MCV: 93.7 fL (ref 80.0–100.0)
Platelets: 150 10*3/uL (ref 150–400)
RBC: 3.98 MIL/uL — ABNORMAL LOW (ref 4.22–5.81)
RDW: 15.5 % (ref 11.5–15.5)
WBC: 8.7 10*3/uL (ref 4.0–10.5)
nRBC: 0 % (ref 0.0–0.2)

## 2019-05-21 LAB — BASIC METABOLIC PANEL
Anion gap: 15 (ref 5–15)
BUN: 128 mg/dL — ABNORMAL HIGH (ref 8–23)
CO2: 25 mmol/L (ref 22–32)
Calcium: 8.2 mg/dL — ABNORMAL LOW (ref 8.9–10.3)
Chloride: 117 mmol/L — ABNORMAL HIGH (ref 98–111)
Creatinine, Ser: 4.89 mg/dL — ABNORMAL HIGH (ref 0.61–1.24)
GFR calc Af Amer: 11 mL/min — ABNORMAL LOW (ref >60)
GFR calc non Af Amer: 10 mL/min — ABNORMAL LOW (ref >60)
Glucose, Bld: 178 mg/dL — ABNORMAL HIGH (ref 70–99)
Potassium: 2.9 mmol/L — ABNORMAL LOW (ref 3.5–5.1)
Sodium: 157 mmol/L — ABNORMAL HIGH (ref 135–145)

## 2019-05-21 LAB — RESPIRATORY PANEL BY RT PCR (FLU A&B, COVID)
Influenza A by PCR: NEGATIVE
Influenza B by PCR: NEGATIVE
SARS Coronavirus 2 by RT PCR: POSITIVE — AB

## 2019-05-21 LAB — MAGNESIUM: Magnesium: 2.7 mg/dL — ABNORMAL HIGH (ref 1.7–2.4)

## 2019-05-21 LAB — GLUCOSE, CAPILLARY
Glucose-Capillary: 156 mg/dL — ABNORMAL HIGH (ref 70–99)
Glucose-Capillary: 155 mg/dL — ABNORMAL HIGH (ref 70–99)

## 2019-05-21 LAB — LACTIC ACID, PLASMA: Lactic Acid, Venous: 1.5 mmol/L (ref 0.5–1.9)

## 2019-05-21 MED ORDER — POTASSIUM CHLORIDE 10 MEQ/100ML IV SOLN: 10.0000 meq | INTRAVENOUS | Status: DC

## 2019-05-21 MED ORDER — LORAZEPAM 2 MG/ML IJ SOLN: 1.0000 mg | INTRAMUSCULAR | Status: DC | PRN

## 2019-05-21 MED ORDER — OXYCODONE HCL 20 MG/ML PO CONC
5.0000 mg | ORAL | Status: DC
  Administered 2019-05-21 – 2019-05-25 (×15): 5 mg via ORAL
  Filled 2019-05-21 (×17): qty 1

## 2019-05-21 MED ORDER — HALOPERIDOL LACTATE 2 MG/ML PO CONC: 0.5000 mg | ORAL | Status: DC | PRN

## 2019-05-21 MED ORDER — ONDANSETRON 4 MG PO TBDP: 4.0000 mg | ORAL_TABLET | Freq: Four times a day (QID) | ORAL | Status: DC | PRN

## 2019-05-21 MED ORDER — HALOPERIDOL LACTATE 5 MG/ML IJ SOLN: 0.5000 mg | INTRAMUSCULAR | Status: DC | PRN

## 2019-05-21 MED ORDER — GLYCOPYRROLATE 0.2 MG/ML IJ SOLN: 0.2000 mg | INTRAMUSCULAR | Status: DC | PRN

## 2019-05-21 MED ORDER — HALOPERIDOL 0.5 MG PO TABS: 0.5000 mg | ORAL_TABLET | ORAL | Status: DC | PRN

## 2019-05-21 MED ORDER — ONDANSETRON HCL 4 MG/2ML IJ SOLN: 4.0000 mg | Freq: Four times a day (QID) | INTRAMUSCULAR | Status: DC | PRN

## 2019-05-21 MED ORDER — SODIUM CHLORIDE 0.9 % IV BOLUS
500.0000 mL | Freq: Once | INTRAVENOUS | Status: AC
  Administered 2019-05-21: 500 mL via INTRAVENOUS

## 2019-05-21 MED ORDER — GLYCOPYRROLATE 1 MG PO TABS: 1.0000 mg | ORAL_TABLET | ORAL | Status: DC | PRN

## 2019-05-21 MED ORDER — MORPHINE SULFATE (PF) 2 MG/ML IV SOLN: 2.0000 mg | INTRAVENOUS | Status: DC | PRN

## 2019-05-21 NOTE — Care Management Important Message (Signed)
Important Message  Patient Details  Name: [name] MRN: [id number] Date of Birth: 01-Jan-1925   Medicare Important Message Given:  Yes     Ihor Gully, LCSW 05/21/2019, 12:09 PM

## 2019-05-21 NOTE — Plan of Care (Signed)
Pt has been made comfort care. Goal of care is to keep patient as comfortable as possible. Foley catheter has been inserted. Oral and IV medications ordered for comfort. Family aware and agreeable to plan.  Problem: Coping: Goal: Level of anxiety will decrease Outcome: Progressing   Problem: Elimination: Goal: Will not experience complications related to urinary retention Outcome: Progressing   Problem: Pain Managment: Goal: General experience of comfort will improve Outcome: Progressing   Problem: Safety: Goal: Ability to remain free from injury will improve Outcome: Progressing   Problem: Skin Integrity: Goal: Risk for impaired skin integrity will decrease Outcome: Progressing

## 2019-05-21 NOTE — Progress Notes (Signed)
Called and spoke with pt's daughter [name] regarding visitation policy for comfort care patients. Explained 2 visitor allowance, 15 minutes per day, and must wear PPE. Mrs. [name] stated understanding and states she and her daughter will be visiting later this afternoon.

## 2019-05-21 NOTE — Progress Notes (Signed)
Palliative-  Per discussion with Dr. Manuella Ghazi and RN patient has been transitioned to full comfort only.  Patient seen, lying in bed being cleaned- noted grimacing with cleaning- per report he has wounds on sacrum and penis is ulcerated.   Plan:  - Insert foley catheter for comfort care -discontinue labs and other measures not intended for comfort -Schedule oxycodone intensol q4hrs for likely pain in patient unable to communicate - Other comfort measures as ordered.   Mariana Kaufman, AGNP-C Palliative Medicine   Greater than 50%  of this time was spent counseling and coordinating care related to the above assessment and plan  Total time: 25 minutes

## 2019-05-21 NOTE — Progress Notes (Signed)
Pt continues to be hypotensive, 80/41. MD made aware.

## 2019-05-21 NOTE — Progress Notes (Signed)
PROGRESS NOTE    [name]  [id number] DOB: 1924/10/20 DOA: 05/08/2019 PCP: Janora Norlander, DO   Brief Narrative:  Per HPI: Craig Lucy Kendrickis a 85 y.o.malewith medical history significant foradvanced dementia,non-verbal at baseline,coronary artery disease status post PCI in 1996, stage III chronic kidney disease with baseline creatinine of 1.4-1.6, type 2 diabetes mellitus, hypertension, acquired hypothyroidism, who is admitted to [facility] on 05/12/2019 with acute metabolic encephalopathy after presenting from [place] emergency department for further evaluation of increased somnolence.  In the setting of the patient's advanced dementia, the following history is provided by the patient'sdaughter [name] (POA),my discussions with the emergency department physician, and via chart review.  The patient has reportedly been demonstrating to 3 days of increased somnolence, sleeping several hours more relative to baseline sleep requirements. This period time is also been associated with diminished oral intake.Not associated with any vomiting or diarrhea. Additionally, SNFstaff has reportedly noted generalized weakness over the last week.Specifically, the patient reportedly ambulates with a walker at baseline, but in the setting of progressive generalized weakness over the last week, has been too weak to ambulate at all.Outpatient COVID-19 testing performed on 05/12/2019 was found to be positive, and confirmed via FAX sent by [facility] emergency department this evening.The patient has a distant smoking history, having reportedly he completely quit smoking in the 1940s, and possesses no known chronic underlying pulmonary conditions. Per discussions with the patient's POA, the patient is to be full code.  12/22: Patient was admitted with acute metabolic encephalopathy in the setting of advanced dementia and likely related to AKI on CKD  stage III.  He also may have a UTI and has been started on cefepime empirically.  He continues to remain encephalopathic and I have had discussion with his daughter who is now agreeable to DNR.  Palliative care consultation ordered and pending.  Continue IV fluids and monitor labs.  Further work-up of AKI pending.  Avoid nephrology consultation for now.  12/23: Patient continues to have worsening renal function and little to no urine output.  His blood pressures are soft and he is not eating or responding very much.  I have discussed this with family members as well as with palliative care and patient is now comfort measures.  Assessment & Plan:   Principal Problem:   Acute metabolic encephalopathy Active Problems:   Dehydration   Hypokalemia   COVID-19 virus infection   AKI (acute kidney injury) (HCC)   Hypernatremia   Acute renal failure superimposed on chronic kidney disease (HCC)   Goals of care, counseling/discussion   Advanced care planning/counseling discussion   Palliative care by specialist   Acute metabolic encephalopathy in the setting of advanced dementia -Continue to monitor closely, but is likely related to AKI -Now on comfort measures  AKI on CKD stage III (baseline creatinine 1.4-1.6) -Continues to have elevated creatinine  Hypernatremia  COVID-19 infection -Currently on room air with no need for steroids or remdesivir  Severe sepsis possibly related to UTI  Hypokalemia  Type 2 diabetes  Hypothyroidism  Patient now on comfort measures   DVT prophylaxis:  None Code Status: DNR/comfort measures Family Communication: Discussed with daughter on phone Disposition Plan:  Continue on comfort care   Consultants:   Palliative care  Procedures:   None  Antimicrobials:  Anti-infectives (From admission, onward)   Start     Dose/Rate Route Frequency Ordered Stop   05/20/19 1600  ceFEPIme (MAXIPIME) 500 mg in dextrose  5 % 50 mL IVPB  Status:   Discontinued     500 mg 100 mL/hr over 30 Minutes Intravenous Every 24 hours 05/18/2019 1951 05/20/19 1026   05/20/19 1600  ceFEPIme (MAXIPIME) 1 g in sodium chloride 0.9 % 100 mL IVPB  Status:  Discontinued     1 g 200 mL/hr over 30 Minutes Intravenous Every 24 hours 05/20/19 1027 05/21/19 1141   05/20/2019 1515  ceFEPIme (MAXIPIME) 2 g in sodium chloride 0.9 % 100 mL IVPB     2 g 200 mL/hr over 30 Minutes Intravenous  Once 05/08/2019 1508 05/24/2019 1602   05/26/2019 1515  metroNIDAZOLE (FLAGYL) IVPB 500 mg     500 mg 100 mL/hr over 60 Minutes Intravenous  Once 05/02/2019 1508 05/20/2019 1718   05/18/2019 1515  vancomycin (VANCOCIN) IVPB 1000 mg/200 mL premix     1,000 mg 200 mL/hr over 60 Minutes Intravenous  Once 05/17/2019 1508 05/27/2019 1718       Subjective: Patient seen and evaluated today and is very minimally responsive and has soft blood pressure readings.  He is not eating.  Objective: Vitals:   05/20/19 1600 05/20/19 2106 05/21/19 0458 05/21/19 0830  BP: (!) 90/44 (!) 100/56 (!) 80/41 110/64  Pulse: 91 97 95 92  Resp: 16 20 20 20   Temp: (!) 97.5 F (36.4 C) 98.2 F (36.8 C) 98.4 F (36.9 C)   TempSrc: Axillary Oral Oral   SpO2: 93% 93% 92%   Weight:   69.9 kg   Height:        Intake/Output Summary (Last 24 hours) at 05/21/2019 1430 Last data filed at 05/21/2019 0900 Gross per 24 hour  Intake 487.69 ml  Output --  Net 487.69 ml   Filed Weights   05/11/2019 2221 05/20/19 0416 05/21/19 0458  Weight: 66.4 kg 68.4 kg 69.9 kg    Examination:  General exam: Appears calm and comfortable, poorly responsive and somnolent Respiratory system: Clear to auscultation. Respiratory effort normal.  Currently on room air. Cardiovascular system: S1 & S2 heard, RRR. No JVD, murmurs, rubs, gallops or clicks. No pedal edema. Gastrointestinal system: Abdomen is nondistended, soft and nontender. No organomegaly or masses felt. Normal bowel sounds heard. Central nervous system: Somnolent and  unresponsive. Extremities: No edema Skin: No rashes, lesions or ulcers Psychiatry: Cannot be assessed given patient condition    Data Reviewed: I have personally reviewed following labs and imaging studies  CBC: Recent Labs  Lab 05/09/2019 1527 05/20/19 0600 05/21/19 0626  WBC 11.3* 15.9* 8.7  NEUTROABS 9.3* 14.6*  --   HGB 16.7 14.3 11.5*  HCT 53.8* 46.2 37.3*  MCV 92.8 92.0 93.7  PLT 250 203 [id number]   Basic Metabolic Panel: Recent Labs  Lab 05/08/2019 1527 05/20/19 0600 05/21/19 0626  NA 157* 157* 157*  K 3.0* 3.1* 2.9*  CL 110 114* 117*  CO2 27 24 25   GLUCOSE 248* 289* 178*  BUN 109* 120* 128*  CREATININE 4.09* 4.58* 4.89*  CALCIUM 9.5 8.6* 8.2*  MG 3.2* 2.9* 2.7*  PHOS  --  5.3*  --    GFR: Estimated Creatinine Clearance: 9.5 mL/min (A) (by C-G formula based on SCr of 4.89 mg/dL (H)). Liver Function Tests: Recent Labs  Lab 05/05/2019 1527 05/20/19 0600  AST 85* 61*  ALT 66* 58*  ALKPHOS 124 98  BILITOT 2.0* 1.5*  PROT 8.0 6.1*  ALBUMIN 3.5 2.6*   No results for input(s): LIPASE, AMYLASE in the last 168 hours. No results for input(s):  AMMONIA in the last 168 hours. Coagulation Profile: No results for input(s): INR, PROTIME in the last 168 hours. Cardiac Enzymes: No results for input(s): CKTOTAL, CKMB, CKMBINDEX, TROPONINI in the last 168 hours. BNP (last 3 results) No results for input(s): PROBNP in the last 8760 hours. HbA1C: No results for input(s): HGBA1C in the last 72 hours. CBG: Recent Labs  Lab 05/20/19 1109 05/20/19 1640 05/20/19 2104 05/21/19 0749 05/21/19 1115  GLUCAP 228* 148* 156* 156* 155*   Lipid Profile: Recent Labs    05/27/2019 1529  TRIG 316*   Thyroid Function Tests: Recent Labs    05/20/19 0100 05/20/19 0908  TSH 0.286*  --   FREET4  --  1.33*   Anemia Panel: Recent Labs    05/20/2019 1529 05/20/19 0600  FERRITIN 260 213   Sepsis Labs: Recent Labs  Lab 05/26/2019 1525 05/26/2019 1529 04/29/2019 1704 05/21/19 0626   PROCALCITON  --  0.13  --   --   LATICACIDVEN 2.9*  --  1.6 1.5    Recent Results (from the past 240 hour(s))  Blood Culture (routine x 2)     Status: None (Preliminary result)   Collection Time: 05/28/2019  3:27 PM   Specimen: Right Antecubital; Blood  Result Value Ref Range Status   Specimen Description RIGHT ANTECUBITAL  Final   Special Requests   Final    BOTTLES DRAWN AEROBIC AND ANAEROBIC Blood Culture adequate volume   Culture   Final    NO GROWTH 2 DAYS Performed at [facility], [address], [place], [place] [address]    Report Status PENDING  Incomplete  Blood Culture (routine x 2)     Status: None (Preliminary result)   Collection Time: 05/27/2019  3:28 PM   Specimen: BLOOD LEFT WRIST  Result Value Ref Range Status   Specimen Description BLOOD LEFT WRIST  Final   Special Requests   Final    BOTTLES DRAWN AEROBIC AND ANAEROBIC Blood Culture adequate volume   Culture   Final    NO GROWTH 2 DAYS Performed at [facility], [address], [place], [place] [address]    Report Status PENDING  Incomplete         Radiology Studies: US RENAL  Result Date: 05/20/2019 CLINICAL DATA:  Inpatient.  Acute kidney injury. EXAM: RENAL / URINARY TRACT ULTRASOUND COMPLETE COMPARISON:  02/16/2017 CT abdomen/pelvis. FINDINGS: Patient combative and unable to move, significantly limiting evaluation. Right Kidney: Renal measurements: 9.8 x 4.2 cm on limited sagittal views. Thin renal parenchyma. Echogenicity within normal limits. No mass or hydronephrosis visualized. Left Kidney: Renal measurements: 11.6 x 6.06.0 cm = volume: 223 mL. Thin renal parenchyma. Echogenicity within normal limits. No mass or hydronephrosis visualized. Bladder: Nonvisualization of the bladder, which is reportedly decompressed by a Foley catheter. Other: None. IMPRESSION: 1. Very limited scan.  No hydronephrosis. 2. Atrophic renal parenchyma in both kidneys. 3. Bladder reportedly decompressed by Foley catheter, not  visualized. Electronically Signed   By: Ilona Sorrel M.D.   On: 05/20/2019 11:26   US Venous Img Lower Bilateral (DVT)  Result Date: 05/20/2019 CLINICAL DATA:  Elevated D-dimer. EXAM: BILATERAL LOWER EXTREMITY VENOUS DOPPLER ULTRASOUND TECHNIQUE: Gray-scale sonography with graded compression, as well as color Doppler and duplex ultrasound were performed to evaluate the lower extremity deep venous systems from the level of the common femoral vein and including the common femoral, femoral, profunda femoral, popliteal and calf veins including the posterior tibial, peroneal and gastrocnemius veins when visible. The superficial  great saphenous vein was also interrogated. Spectral Doppler was utilized to evaluate flow at rest and with distal augmentation maneuvers in the common femoral, femoral and popliteal veins. COMPARISON:  None. FINDINGS: RIGHT LOWER EXTREMITY Common Femoral Vein: No evidence of thrombus. Normal compressibility, respiratory phasicity and response to augmentation. Saphenofemoral Junction: No evidence of thrombus. Normal compressibility and flow on color Doppler imaging. Profunda Femoral Vein: No evidence of thrombus. Normal compressibility and flow on color Doppler imaging. Femoral Vein: No evidence of thrombus. Normal compressibility, respiratory phasicity and response to augmentation. Popliteal Vein: No evidence of thrombus. Normal compressibility, respiratory phasicity and response to augmentation. Calf Veins: No evidence of thrombus. Normal compressibility and flow on color Doppler imaging. Superficial Great Saphenous Vein: No evidence of thrombus. Normal compressibility. Venous Reflux:  None. Other Findings: No evidence of superficial thrombophlebitis or abnormal fluid collection. LEFT LOWER EXTREMITY Common Femoral Vein: No evidence of thrombus. Normal compressibility, respiratory phasicity and response to augmentation. Saphenofemoral Junction: No evidence of thrombus. Normal  compressibility and flow on color Doppler imaging. Profunda Femoral Vein: No evidence of thrombus. Normal compressibility and flow on color Doppler imaging. Femoral Vein: No evidence of thrombus. Normal compressibility, respiratory phasicity and response to augmentation. Popliteal Vein: No evidence of thrombus. Normal compressibility, respiratory phasicity and response to augmentation. Calf Veins: No evidence of thrombus. Normal compressibility and flow on color Doppler imaging. Superficial Great Saphenous Vein: No evidence of thrombus. Normal compressibility. Venous Reflux:  None. Other Findings: No evidence of superficial thrombophlebitis or abnormal fluid collection. IMPRESSION: No evidence of deep venous thrombosis in either lower extremity. Electronically Signed   By: Aletta Edouard M.D.   On: 05/20/2019 12:40   DG Chest Port 1 View  Result Date: 04/29/2019 CLINICAL DATA:  Weakness and shortness of breath. EXAM: PORTABLE CHEST 1 VIEW COMPARISON:  02/16/2017 FINDINGS: The heart is within normal limits and size given the AP projection. There is mild tortuosity of the thoracic aorta. Low lung volumes with vascular crowding and streaky basilar atelectasis. No definite pleural effusions. Pulmonary scarring changes. IMPRESSION: Low lung volumes with vascular crowding and atelectasis. Pulmonary scarring changes. Electronically Signed   By: Marijo Sanes M.D.   On: 05/10/2019 15:48        Scheduled Meds: . mouth rinse  15 mL Mouth Rinse BID  . sodium chloride flush  3 mL Intravenous Q12H   Continuous Infusions:   LOS: 2 days    Time spent: 30 minutes    Tomika Eckles Darleen Crocker, DO [facility] Pager [phone]  If 7PM-7AM, please contact night-coverage [email or web address] Password [email or web address] 05/21/2019, 2:30 PM

## 2019-05-22 DIAGNOSIS — Z515 Encounter for palliative care: Secondary | ICD-10-CM

## 2019-05-22 NOTE — Progress Notes (Signed)
Pt's IV site noted to have bleeding under dressing. Tape loose, leaked when attempted to flush. IV site removed and bandage applied. Spoke with Kasi, NP Palliative Care who came for daily rounding, she stated no restart of IV at this point unless he required IV morphine for pain control and comfort. Currently pt is stable with oral medication.

## 2019-05-22 NOTE — Progress Notes (Signed)
Daily Progress Note   Patient Name: [name]       Date: 05/22/2019 DOB: [birth year]  Age: 85 y.o. MRN#: [id number] Attending Physician: Rodena Goldmann, DO Primary Care Physician: Janora Norlander, DO Admit Date: 05/29/2019  Reason for Consultation/Follow-up: Establishing goals of care  Subjective: Patient in bed, appears comfortable. Continues to decline food and drink.   Review of Systems  Unable to perform ROS: Acuity of condition    Length of Stay: 3  Current Medications: Scheduled Meds:  . mouth rinse  15 mL Mouth Rinse BID  . oxyCODONE  5 mg Oral Q4H  . sodium chloride flush  3 mL Intravenous Q12H    Continuous Infusions:   PRN Meds: acetaminophen **OR** acetaminophen, glycopyrrolate **OR** glycopyrrolate **OR** glycopyrrolate, haloperidol **OR** [DISCONTINUED] haloperidol **OR** haloperidol lactate, LORazepam, morphine injection, ondansetron **OR** ondansetron (ZOFRAN) IV  Physical Exam Vitals and nursing note reviewed.  Constitutional:      Appearance: He is ill-appearing.  Cardiovascular:     Rate and Rhythm: Normal rate.  Pulmonary:     Effort: Pulmonary effort is normal.  Neurological:     Comments: Opens eyes, does not follow commands             Vital Signs: BP 121/65   Pulse 82   Temp 98.4 F (36.9 C) (Oral)   Resp 18   Ht 5\' 11"  (1.803 m)   Wt 69.9 kg   SpO2 91%   BMI 21.49 kg/m  SpO2: SpO2: 91 % O2 Device: O2 Device: Room Air O2 Flow Rate:    Intake/output summary:   Intake/Output Summary (Last 24 hours) at 05/22/2019 1130 Last data filed at 05/22/2019 0900 Gross per 24 hour  Intake 0 ml  Output 975 ml  Net -975 ml   LBM: Last BM Date: (unknown) Baseline Weight: Weight: 68 kg Most recent weight: Weight: 69.9 kg        Palliative Assessment/Data: PPS: 10%      Patient Active Problem List   Diagnosis Date Noted  . COVID-19 virus infection 05/20/2019  . AKI (acute kidney injury) ([place]) 05/20/2019  . Hypernatremia 05/20/2019  . Acute renal failure superimposed on chronic kidney disease ([place])   . Goals of care, counseling/discussion   . Advanced care planning/counseling discussion   . Palliative care by specialist   .  Acute metabolic encephalopathy [id number]  . Pressure injury of skin 02/18/2017  . Generalized weakness 02/16/2017  . Hypokalemia 02/14/2017  . Vertigo 02/13/2017  . CKD (chronic kidney disease), stage III 02/13/2017  . Chronic diastolic CHF (congestive heart failure) ([place]) 02/13/2017  . Orthostasis 02/13/2017  . Eczema 08/31/2015  . Ascites 08/31/2015  . Right hip pain 08/10/2015  . Diabetic polyneuropathy associated with type 2 diabetes mellitus ([place]) 08/10/2015  . Dehydration 08/10/2015  . Alzheimer's dementia ([place]) 08/10/2015  . Hypothyroid 12/04/2012  . Chronic back pain 12/04/2012  . Hard of hearing 09/03/2012  . Aortic insufficiency 09/03/2012  . Vitamin D deficiency 09/03/2012  . Arthritis 09/03/2012  . Insomnia 09/03/2012  . Iron deficiency anemia, unspecified 07/31/2011  . Diabetes mellitus type II, non insulin dependent ([place]) 11/30/2009  . Hyperlipidemia 11/30/2009  . Essential hypertension 11/30/2009  . CORONARY ATHEROSCLEROSIS NATIVE CORONARY ARTERY 11/30/2009    Palliative Care Assessment & Plan   Patient Profile: 85 y.o. male  with past medical history of advanced dementia, CAD, CKD 3, DM2, HTN  admitted on 05/13/2019 with acute metabolic encephalopathy. He is COVID positive, has stopped eating and drinking. He as acute on chronic kidney injury that is slowly improving with IV fluids. He is very somnolent- sleeping more than awake. Palliative medicine consulted for goals of care.   Assessment/Recommendations/Plan  Continue current comfort care  Goals of  Care and Additional Recommendations: Limitations on Scope of Treatment: Full Comfort Care  Code Status: DNR  Prognosis:  < 2 weeks due to COVID- not eating, drinking, in the setting of advanced dementia  Discharge Planning: Anticipated Hospital Death  Care plan was discussed with patient's RN  Thank you for allowing the Palliative Medicine Team to assist in the care of this patient.   Time In: 0945 Time Out: 1020 Total Time 35 mins Prolonged Time Billed no      Greater than 50%  of this time was spent counseling and coordinating care related to the above assessment and plan.  Mariana Kaufman, AGNP-C Palliative Medicine   Please contact Palliative Medicine Team phone at [phone] for questions and concerns.

## 2019-05-22 NOTE — Progress Notes (Addendum)
PROGRESS NOTE    [name]  [id number] DOB: [birth year] DOA: 05/11/2019 PCP: Janora Norlander, DO   Brief Narrative:  Per HPI: [name] a 85 y.o.malewith medical history significant foradvanced dementia,non-verbal at baseline,coronary artery disease status post PCI in 1996, stage III chronic kidney disease with baseline creatinine of 1.4-1.6, type 2 diabetes mellitus, hypertension, acquired hypothyroidism, who is admitted to [facility] on 05/18/2019 with acute metabolic encephalopathy after presenting from [place] emergency department for further evaluation of increased somnolence.  In the setting of the patient's advanced dementia, the following history is provided by the patient'sdaughter [name] (POA),my discussions with the emergency department physician, and via chart review.  The patient has reportedly been demonstrating to 3 days of increased somnolence, sleeping several hours more relative to baseline sleep requirements. This period time is also been associated with diminished oral intake.Not associated with any vomiting or diarrhea. Additionally, SNFstaff has reportedly noted generalized weakness over the last week.Specifically, the patient reportedly ambulates with a walker at baseline, but in the setting of progressive generalized weakness over the last week, has been too weak to ambulate at all.Outpatient COVID-19 testing performed on 05/12/2019 was found to be positive, and confirmed via FAX sent by [facility] emergency department this evening.The patient has a distant smoking history, having reportedly he completely quit smoking in the 1940s, and possesses no known chronic underlying pulmonary conditions. Per discussions with the patient's POA, the patient is to be full code.  12/22:Patient was admitted with acute metabolic encephalopathy in the setting of advanced dementia and likely related to AKI on CKD  stage III. He also may have a UTI and has been started on cefepime empirically. He continues to remain encephalopathic and I have had discussion with his daughter who is now agreeable to DNR. Palliative care consultation ordered and pending. Continue IV fluids and monitor labs. Further work-up of AKI pending. Avoid nephrology consultation for now.  12/23: Patient continues to have worsening renal function and little to no urine output.  His blood pressures are soft and he is not eating or responding very much.  I have discussed this with family members as well as with palliative care and patient is now comfort measures.  12/24: Patient remains on comfort measures with no acute events noted overnight.  Assessment & Plan:   Principal Problem:   Acute metabolic encephalopathy Active Problems:   Dehydration   Hypokalemia   COVID-19 virus infection   AKI (acute kidney injury) (HCC)   Hypernatremia   Acute renal failure superimposed on chronic kidney disease (HCC)   Goals of care, counseling/discussion   Advanced care planning/counseling discussion   Palliative care by specialist   Comfort measures only status   Acute metabolic encephalopathy in the setting of advanced dementia -Continue to monitor closely, but is likely related to AKI -Now on comfort measures  AKI on CKD stage III (baseline creatinine 1.4-1.6) -Continues to have elevated creatinine  Hypernatremia  COVID-19 infection -Currently on room air with no need for steroids or remdesivir  Severe sepsis possibly related to UTI  Hypokalemia  Type 2 diabetes  Hypothyroidism  Patient now on comfort measures   DVT prophylaxis: None Code Status:DNR/comfort measures Family Communication:Discussed with daughter on phone Disposition Plan: Continue on comfort care, anticipate in-hospital death.   Consultants:  Palliative care  Procedures:  None  Antimicrobials:  Anti-infectives (From  admission, onward)   Start     Dose/Rate Route Frequency Ordered Stop  05/20/19 1600  ceFEPIme (MAXIPIME) 500 mg in dextrose 5 % 50 mL IVPB  Status:  Discontinued     500 mg 100 mL/hr over 30 Minutes Intravenous Every 24 hours 05/06/2019 1951 05/20/19 1026   05/20/19 1600  ceFEPIme (MAXIPIME) 1 g in sodium chloride 0.9 % 100 mL IVPB  Status:  Discontinued     1 g 200 mL/hr over 30 Minutes Intravenous Every 24 hours 05/20/19 1027 05/21/19 1141   05/18/2019 1515  ceFEPIme (MAXIPIME) 2 g in sodium chloride 0.9 % 100 mL IVPB     2 g 200 mL/hr over 30 Minutes Intravenous  Once 05/07/2019 1508 05/20/2019 1602   05/04/2019 1515  metroNIDAZOLE (FLAGYL) IVPB 500 mg     500 mg 100 mL/hr over 60 Minutes Intravenous  Once 05/09/2019 1508 05/07/2019 1718   05/05/2019 1515  vancomycin (VANCOCIN) IVPB 1000 mg/200 mL premix     1,000 mg 200 mL/hr over 60 Minutes Intravenous  Once 05/14/2019 1508 05/18/2019 1718       Subjective: Patient is currently somnolent and minimally responsive.  Objective: Vitals:   05/20/19 2106 05/21/19 0458 05/21/19 0830 05/22/19 0519  BP: (!) 100/56 (!) 80/41 110/64 121/65  Pulse: 97 95 92 82  Resp: 20 20 20 18   Temp: 98.2 F (36.8 C) 98.4 F (36.9 C)  98.4 F (36.9 C)  TempSrc: Oral Oral  Oral  SpO2: 93% 92%  91%  Weight:  69.9 kg    Height:        Intake/Output Summary (Last 24 hours) at 05/22/2019 1134 Last data filed at 05/22/2019 0900 Gross per 24 hour  Intake 0 ml  Output 975 ml  Net -975 ml   Filed Weights   05/23/2019 2221 05/20/19 0416 05/21/19 0458  Weight: 66.4 kg 68.4 kg 69.9 kg    Examination:  General exam: Appears somnolent and minimally responsive Respiratory system: Clear to auscultation. Respiratory effort normal. Cardiovascular system: S1 & S2 heard, RRR. No JVD, murmurs, rubs, gallops or clicks. No pedal edema. Gastrointestinal system: Abdomen is nondistended, soft and nontender. No organomegaly or masses felt. Normal bowel sounds heard. Central  nervous system: Minimally responsive Extremities: No edema. Skin: No rashes, lesions or ulcers Psychiatry: Cannot be assessed given patient condition    Data Reviewed: I have personally reviewed following labs and imaging studies  CBC: Recent Labs  Lab 05/15/2019 1527 05/20/19 0600 05/21/19 0626  WBC 11.3* 15.9* 8.7  NEUTROABS 9.3* 14.6*  --   HGB 16.7 14.3 11.5*  HCT 53.8* 46.2 37.3*  MCV 92.8 92.0 93.7  PLT 250 203 Q000111Q   Basic Metabolic Panel: Recent Labs  Lab 05/24/2019 1527 05/20/19 0600 05/21/19 0626  NA 157* 157* 157*  K 3.0* 3.1* 2.9*  CL 110 114* 117*  CO2 27 24 25   GLUCOSE 248* 289* 178*  BUN 109* 120* 128*  CREATININE 4.09* 4.58* 4.89*  CALCIUM 9.5 8.6* 8.2*  MG 3.2* 2.9* 2.7*  PHOS  --  5.3*  --    GFR: Estimated Creatinine Clearance: 9.5 mL/min (A) (by C-G formula based on SCr of 4.89 mg/dL (H)). Liver Function Tests: Recent Labs  Lab 05/29/2019 1527 05/20/19 0600  AST 85* 61*  ALT 66* 58*  ALKPHOS 124 98  BILITOT 2.0* 1.5*  PROT 8.0 6.1*  ALBUMIN 3.5 2.6*   No results for input(s): LIPASE, AMYLASE in the last 168 hours. No results for input(s): AMMONIA in the last 168 hours. Coagulation Profile: No results for input(s): INR, PROTIME in the  last 168 hours. Cardiac Enzymes: No results for input(s): CKTOTAL, CKMB, CKMBINDEX, TROPONINI in the last 168 hours. BNP (last 3 results) No results for input(s): PROBNP in the last 8760 hours. HbA1C: No results for input(s): HGBA1C in the last 72 hours. CBG: Recent Labs  Lab 05/20/19 1109 05/20/19 1640 05/20/19 2104 05/21/19 0749 05/21/19 1115  GLUCAP 228* 148* 156* 156* 155*   Lipid Profile: Recent Labs    05/13/2019 1529  TRIG 316*   Thyroid Function Tests: Recent Labs    05/20/19 0100 05/20/19 0908  TSH 0.286*  --   FREET4  --  1.33*   Anemia Panel: Recent Labs    05/03/2019 1529 05/20/19 0600  FERRITIN 260 213   Sepsis Labs: Recent Labs  Lab 05/09/2019 1525 05/02/2019 1529  05/29/2019 1704 05/21/19 0626  PROCALCITON  --  0.13  --   --   LATICACIDVEN 2.9*  --  1.6 1.5    Recent Results (from the past 240 hour(s))  Blood Culture (routine x 2)     Status: None (Preliminary result)   Collection Time: 05/23/2019  3:27 PM   Specimen: Right Antecubital; Blood  Result Value Ref Range Status   Specimen Description RIGHT ANTECUBITAL  Final   Special Requests   Final    BOTTLES DRAWN AEROBIC AND ANAEROBIC Blood Culture adequate volume   Culture   Final    NO GROWTH 3 DAYS Performed at [facility], [address], [place], [place] [address]    Report Status PENDING  Incomplete  Blood Culture (routine x 2)     Status: None (Preliminary result)   Collection Time: 05/05/2019  3:28 PM   Specimen: BLOOD LEFT WRIST  Result Value Ref Range Status   Specimen Description BLOOD LEFT WRIST  Final   Special Requests   Final    BOTTLES DRAWN AEROBIC AND ANAEROBIC Blood Culture adequate volume   Culture   Final    NO GROWTH 3 DAYS Performed at [facility], [address], [place], [place] [address]    Report Status PENDING  Incomplete  Respiratory Panel by RT PCR (Flu A&B, Covid) - Nasopharyngeal Swab     Status: Abnormal   Collection Time: 05/21/19  2:47 PM   Specimen: Nasopharyngeal Swab  Result Value Ref Range Status   SARS Coronavirus 2 by RT PCR POSITIVE (A) NEGATIVE Final    Comment: RESULT CALLED TO, READ BACK BY AND VERIFIED WITH: RENALDS,RN AT 1626 ON 12.23.20 BY ISLEY,B (NOTE) SARS-CoV-2 target nucleic acids are DETECTED. SARS-CoV-2 RNA is generally detectable in upper respiratory specimens  during the acute phase of infection. Positive results are indicative of the presence of the identified virus, but do not rule out bacterial infection or co-infection with other pathogens not detected by the test. Clinical correlation with patient history and other diagnostic information is necessary to determine patient infection status. The expected result is  Negative. Fact Sheet for Patients:  PinkCheek.be Fact Sheet for Healthcare Providers: GravelBags.it This test is not yet approved or cleared by the [place] FDA and  has been authorized for detection and/or diagnosis of SARS-CoV-2 by FDA under an Emergency Use Authorization (EUA).  This EUA will remain in effect (meaning this test can be u sed) for the duration of  the COVID-19 declaration under Section 564(b)(1) of the Act, 21 U.S.C. section 360bbb-3(b)(1), unless the authorization is terminated or revoked sooner.    Influenza A by PCR NEGATIVE NEGATIVE Final   Influenza B by PCR NEGATIVE NEGATIVE  Final    Comment: (NOTE) The Xpert Xpress SARS-CoV-2/FLU/RSV assay is intended as an aid in  the diagnosis of influenza from Nasopharyngeal swab specimens and  should not be used as a sole basis for treatment. Nasal washings and  aspirates are unacceptable for Xpert Xpress SARS-CoV-2/FLU/RSV  testing. Fact Sheet for Patients: PinkCheek.be Fact Sheet for Healthcare Providers: GravelBags.it This test is not yet approved or cleared by the [place] FDA and  has been authorized for detection and/or diagnosis of SARS-CoV-2 by  FDA under an Emergency Use Authorization (EUA). This EUA will remain  in effect (meaning this test can be used) for the duration of the  Covid-19 declaration under Section 564(b)(1) of the Act, 21  U.S.C. section 360bbb-3(b)(1), unless the authorization is  terminated or revoked. Performed at [facility], [address] [place], [place] [address]          Radiology Studies: No results found.      Scheduled Meds: . mouth rinse  15 mL Mouth Rinse BID  . oxyCODONE  5 mg Oral Q4H  . sodium chloride flush  3 mL Intravenous Q12H   Continuous Infusions:   LOS: 3 days    Time spent: 30 minutes    Telesha Deguzman Darleen Crocker, DO [facility] Pager [phone]  If 7PM-7AM, please contact night-coverage [email or web address] Password [facility] 05/22/2019, 11:34 AM

## 2019-05-22 NOTE — Progress Notes (Signed)
MD made aware of scheduled Roxicodone not given d/t pyxis machine malfunction.

## 2019-05-23 NOTE — Progress Notes (Signed)
PROGRESS NOTE    [name]  [id number] DOB: [birth year] DOA: 05/28/2019 PCP: Janora Norlander, DO   Brief Narrative:  Per HPI: [name] a 85 y.o.malewith medical history significant foradvanced dementia,non-verbal at baseline,coronary artery disease status post PCI in 1996, stage III chronic kidney disease with baseline creatinine of 1.4-1.6, type 2 diabetes mellitus, hypertension, acquired hypothyroidism, who is admitted to [facility] on 05/24/2019 with acute metabolic encephalopathy after presenting from [place] emergency department for further evaluation of increased somnolence.  In the setting of the patient's advanced dementia, the following history is provided by the patient'sdaughter [name] (POA),my discussions with the emergency department physician, and via chart review.  The patient has reportedly been demonstrating to 3 days of increased somnolence, sleeping several hours more relative to baseline sleep requirements. This period time is also been associated with diminished oral intake.Not associated with any vomiting or diarrhea. Additionally, SNFstaff has reportedly noted generalized weakness over the last week.Specifically, the patient reportedly ambulates with a walker at baseline, but in the setting of progressive generalized weakness over the last week, has been too weak to ambulate at all.Outpatient COVID-19 testing performed on 05/12/2019 was found to be positive, and confirmed via FAX sent by [facility] emergency department this evening.The patient has a distant smoking history, having reportedly he completely quit smoking in the 1940s, and possesses no known chronic underlying pulmonary conditions. Per discussions with the patient's POA, the patient is to be full code.  12/22:Patient was admitted with acute metabolic encephalopathy in the setting of advanced dementia and likely related to AKI on CKD  stage III. He also may have a UTI and has been started on cefepime empirically. He continues to remain encephalopathic and I have had discussion with his daughter who is now agreeable to DNR. Palliative care consultation ordered and pending. Continue IV fluids and monitor labs. Further work-up of AKI pending. Avoid nephrology consultation for now.  12/23:Patient continues to have worsening renal function and little to no urine output. His blood pressures are soft and he is not eating or responding very much. I have discussed this with family members as well as with palliative care and patient is now comfort measures.  12/24-25: Patient remains on comfort measures with no acute events noted overnight.   Assessment & Plan:   Principal Problem:   Acute metabolic encephalopathy Active Problems:   Dehydration   Hypokalemia   COVID-19 virus infection   AKI (acute kidney injury) (HCC)   Hypernatremia   Acute renal failure superimposed on chronic kidney disease (HCC)   Goals of care, counseling/discussion   Advanced care planning/counseling discussion   Palliative care by specialist   Comfort measures only status   Acute metabolic encephalopathy in the setting of advanced dementia -Continue to monitor closely, but is likely related to AKI -Now on comfort measures  AKI on CKD stage III (baseline creatinine 1.4-1.6) -Continues to have elevated creatinine  Hypernatremia  COVID-19 infection -Currently on room air with no need for steroids or remdesivir  Severe sepsis possibly related to UTI  Hypokalemia  Type 2 diabetes  Hypothyroidism  Patient now on comfort measures   DVT prophylaxis:None Code Status:DNR/comfort measures Family Communication:Discussed with daughter on phone Disposition Plan:Continue on comfort care, anticipate in-hospital death.   Consultants:  Palliative care  Procedures:  None  Antimicrobials:  Anti-infectives  (From admission, onward)   Start     Dose/Rate Route Frequency Ordered Stop   05/20/19 1600  ceFEPIme (MAXIPIME) 500 mg in dextrose 5 % 50 mL IVPB  Status:  Discontinued     500 mg 100 mL/hr over 30 Minutes Intravenous Every 24 hours 05/14/2019 1951 05/20/19 1026   05/20/19 1600  ceFEPIme (MAXIPIME) 1 g in sodium chloride 0.9 % 100 mL IVPB  Status:  Discontinued     1 g 200 mL/hr over 30 Minutes Intravenous Every 24 hours 05/20/19 1027 05/21/19 1141   05/24/2019 1515  ceFEPIme (MAXIPIME) 2 g in sodium chloride 0.9 % 100 mL IVPB     2 g 200 mL/hr over 30 Minutes Intravenous  Once 05/27/2019 1508 05/15/2019 1602   05/21/2019 1515  metroNIDAZOLE (FLAGYL) IVPB 500 mg     500 mg 100 mL/hr over 60 Minutes Intravenous  Once 05/23/2019 1508 05/05/2019 1718   05/13/2019 1515  vancomycin (VANCOCIN) IVPB 1000 mg/200 mL premix     1,000 mg 200 mL/hr over 60 Minutes Intravenous  Once 05/15/2019 1508 04/30/2019 1718       Subjective: Patient seen and evaluated today with no new acute complaints or concerns. No acute concerns or events noted overnight.  Objective: Vitals:   05/21/19 0830 05/22/19 0519 05/22/19 2017 05/23/19 1025  BP: 110/64 121/65 (!) 101/54 99/64  Pulse: 92 82 98 (!) 112  Resp: 20 18  18   Temp:  98.4 F (36.9 C) 97.7 F (36.5 C) 100 F (37.8 C)  TempSrc:  Oral Oral Oral  SpO2:  91% (!) 86% (!) 84%  Weight:      Height:        Intake/Output Summary (Last 24 hours) at 05/23/2019 1307 Last data filed at 05/23/2019 0700 Gross per 24 hour  Intake 25 ml  Output 1325 ml  Net -1300 ml   Filed Weights   05/28/2019 2221 05/20/19 0416 05/21/19 0458  Weight: 66.4 kg 68.4 kg 69.9 kg    Examination:  General exam: Appears calm and comfortable  Respiratory system: Clear to auscultation. Respiratory effort normal. Cardiovascular system: S1 & S2 heard, RRR. No JVD, murmurs, rubs, gallops or clicks. No pedal edema. Gastrointestinal system: Abdomen is nondistended, soft and nontender. No  organomegaly or masses felt. Normal bowel sounds heard. Central nervous system: Somnolent and unresponsive Extremities: Symmetric 5 x 5 power. Skin: No rashes, lesions or ulcers Psychiatry: Cannot be evaluated given patient condition    Data Reviewed: I have personally reviewed following labs and imaging studies  CBC: Recent Labs  Lab 05/14/2019 1527 05/20/19 0600 05/21/19 0626  WBC 11.3* 15.9* 8.7  NEUTROABS 9.3* 14.6*  --   HGB 16.7 14.3 11.5*  HCT 53.8* 46.2 37.3*  MCV 92.8 92.0 93.7  PLT 250 203 Q000111Q   Basic Metabolic Panel: Recent Labs  Lab 05/24/2019 1527 05/20/19 0600 05/21/19 0626  NA 157* 157* 157*  K 3.0* 3.1* 2.9*  CL 110 114* 117*  CO2 27 24 25   GLUCOSE 248* 289* 178*  BUN 109* 120* 128*  CREATININE 4.09* 4.58* 4.89*  CALCIUM 9.5 8.6* 8.2*  MG 3.2* 2.9* 2.7*  PHOS  --  5.3*  --    GFR: Estimated Creatinine Clearance: 9.5 mL/min (A) (by C-G formula based on SCr of 4.89 mg/dL (H)). Liver Function Tests: Recent Labs  Lab 05/17/2019 1527 05/20/19 0600  AST 85* 61*  ALT 66* 58*  ALKPHOS 124 98  BILITOT 2.0* 1.5*  PROT 8.0 6.1*  ALBUMIN 3.5 2.6*   No results for input(s): LIPASE, AMYLASE in the last 168 hours. No results for input(s): AMMONIA in  the last 168 hours. Coagulation Profile: No results for input(s): INR, PROTIME in the last 168 hours. Cardiac Enzymes: No results for input(s): CKTOTAL, CKMB, CKMBINDEX, TROPONINI in the last 168 hours. BNP (last 3 results) No results for input(s): PROBNP in the last 8760 hours. HbA1C: No results for input(s): HGBA1C in the last 72 hours. CBG: Recent Labs  Lab 05/20/19 1109 05/20/19 1640 05/20/19 2104 05/21/19 0749 05/21/19 1115  GLUCAP 228* 148* 156* 156* 155*   Lipid Profile: No results for input(s): CHOL, HDL, LDLCALC, TRIG, CHOLHDL, LDLDIRECT in the last 72 hours. Thyroid Function Tests: No results for input(s): TSH, T4TOTAL, FREET4, T3FREE, THYROIDAB in the last 72 hours. Anemia Panel: No  results for input(s): VITAMINB12, FOLATE, FERRITIN, TIBC, IRON, RETICCTPCT in the last 72 hours. Sepsis Labs: Recent Labs  Lab 05/24/2019 1525 05/16/2019 1529 05/27/2019 1704 05/21/19 0626  PROCALCITON  --  0.13  --   --   LATICACIDVEN 2.9*  --  1.6 1.5    Recent Results (from the past 240 hour(s))  Blood Culture (routine x 2)     Status: None (Preliminary result)   Collection Time: 05/20/2019  3:27 PM   Specimen: Right Antecubital; Blood  Result Value Ref Range Status   Specimen Description RIGHT ANTECUBITAL  Final   Special Requests   Final    BOTTLES DRAWN AEROBIC AND ANAEROBIC Blood Culture adequate volume   Culture   Final    NO GROWTH 4 DAYS Performed at [facility], [address] [place], [place] [address]    Report Status PENDING  Incomplete  Blood Culture (routine x 2)     Status: None (Preliminary result)   Collection Time: 05/12/2019  3:28 PM   Specimen: BLOOD LEFT WRIST  Result Value Ref Range Status   Specimen Description BLOOD LEFT WRIST  Final   Special Requests   Final    BOTTLES DRAWN AEROBIC AND ANAEROBIC Blood Culture adequate volume   Culture   Final    NO GROWTH 4 DAYS Performed at [facility], [address] [place], [place] [address]    Report Status PENDING  Incomplete  Respiratory Panel by RT PCR (Flu A&B, Covid) - Nasopharyngeal Swab     Status: Abnormal   Collection Time: 05/21/19  2:47 PM   Specimen: Nasopharyngeal Swab  Result Value Ref Range Status   SARS Coronavirus 2 by RT PCR POSITIVE (A) NEGATIVE Final    Comment: RESULT CALLED TO, READ BACK BY AND VERIFIED WITH: RENALDS,RN AT 1626 ON 12.23.20 BY ISLEY,B (NOTE) SARS-CoV-2 target nucleic acids are DETECTED. SARS-CoV-2 RNA is generally detectable in upper respiratory specimens  during the acute phase of infection. Positive results are indicative of the presence of the identified virus, but do not rule out bacterial infection or co-infection with other pathogens not detected by the test.  Clinical correlation with patient history and other diagnostic information is necessary to determine patient infection status. The expected result is Negative. Fact Sheet for Patients:  PinkCheek.be Fact Sheet for Healthcare Providers: GravelBags.it This test is not yet approved or cleared by the [place] FDA and  has been authorized for detection and/or diagnosis of SARS-CoV-2 by FDA under an Emergency Use Authorization (EUA).  This EUA will remain in effect (meaning this test can be u sed) for the duration of  the COVID-19 declaration under Section 564(b)(1) of the Act, 21 U.S.C. section 360bbb-3(b)(1), unless the authorization is terminated or revoked sooner.    Influenza A by PCR NEGATIVE NEGATIVE Final  Influenza B by PCR NEGATIVE NEGATIVE Final    Comment: (NOTE) The Xpert Xpress SARS-CoV-2/FLU/RSV assay is intended as an aid in  the diagnosis of influenza from Nasopharyngeal swab specimens and  should not be used as a sole basis for treatment. Nasal washings and  aspirates are unacceptable for Xpert Xpress SARS-CoV-2/FLU/RSV  testing. Fact Sheet for Patients: PinkCheek.be Fact Sheet for Healthcare Providers: GravelBags.it This test is not yet approved or cleared by the [place] FDA and  has been authorized for detection and/or diagnosis of SARS-CoV-2 by  FDA under an Emergency Use Authorization (EUA). This EUA will remain  in effect (meaning this test can be used) for the duration of the  Covid-19 declaration under Section 564(b)(1) of the Act, 21  U.S.C. section 360bbb-3(b)(1), unless the authorization is  terminated or revoked. Performed at [facility], [address], [place], [place] [address]          Radiology Studies: No results found.      Scheduled Meds: . mouth rinse  15 mL Mouth Rinse BID  . oxyCODONE  5 mg Oral Q4H  .  sodium chloride flush  3 mL Intravenous Q12H   Continuous Infusions:   LOS: 4 days    Time spent: 30 minutes    Alycen Mack Darleen Crocker, DO [facility] Pager [phone]  If 7PM-7AM, please contact night-coverage [email or web address] Password [email or web address] 05/23/2019, 1:07 PM

## 2019-05-24 LAB — CULTURE, BLOOD (ROUTINE X 2)
Culture: NO GROWTH
Culture: NO GROWTH
Special Requests: ADEQUATE
Special Requests: ADEQUATE

## 2019-05-24 NOTE — Progress Notes (Signed)
PROGRESS NOTE    [name]  [id number] DOB: 1924/07/29 DOA: 05/09/2019 PCP: Janora Norlander, DO   Brief Narrative:  Per HPI: Craig Lucy Kendrickis a 85 y.o.malewith medical history significant foradvanced dementia,non-verbal at baseline,coronary artery disease status post PCI in 1996, stage III chronic kidney disease with baseline creatinine of 1.4-1.6, type 2 diabetes mellitus, hypertension, acquired hypothyroidism, who is admitted to [facility] on 05/24/2019 with acute metabolic encephalopathy after presenting from [place] emergency department for further evaluation of increased somnolence.  In the setting of the patient's advanced dementia, the following history is provided by the patient'sdaughter Craig (POA),my discussions with the emergency department physician, and via chart review.  The patient has reportedly been demonstrating to 3 days of increased somnolence, sleeping several hours more relative to baseline sleep requirements. This period time is also been associated with diminished oral intake.Not associated with any vomiting or diarrhea. Additionally, SNFstaff has reportedly noted generalized weakness over the last week.Specifically, the patient reportedly ambulates with a walker at baseline, but in the setting of progressive generalized weakness over the last week, has been too weak to ambulate at all.Outpatient COVID-19 testing performed on 05/12/2019 was found to be positive, and confirmed via FAX sent by [facility] emergency department this evening.The patient has a distant smoking history, having reportedly he completely quit smoking in the 1940s, and possesses no known chronic underlying pulmonary conditions. Per discussions with the patient's POA, the patient is to be full code.  12/22:Patient was admitted with acute metabolic encephalopathy in the setting of advanced dementia and likely related to AKI on CKD  stage III. He also may have a UTI and has been started on cefepime empirically. He continues to remain encephalopathic and I have had discussion with his daughter who is now agreeable to DNR. Palliative care consultation ordered and pending. Continue IV fluids and monitor labs. Further work-up of AKI pending. Avoid nephrology consultation for now.  12/23:Patient continues to have worsening renal function and little to no urine output. His blood pressures are soft and he is not eating or responding very much. I have discussed this with family members as well as with palliative care and patient is now comfort measures.  12/24-26:Patient remains on comfort measures with no acute events noted overnight.  He was placed on 5 L nasal cannula overnight which will be gradually weaned as he is on comfort protocol.   Assessment & Plan:   Principal Problem:   Acute metabolic encephalopathy Active Problems:   Dehydration   Hypokalemia   COVID-19 virus infection   AKI (acute kidney injury) (HCC)   Hypernatremia   Acute renal failure superimposed on chronic kidney disease (HCC)   Goals of care, counseling/discussion   Advanced care planning/counseling discussion   Palliative care by specialist   Comfort measures only status   Acute metabolic encephalopathy in the setting of advanced dementia -Continue to monitor closely, but is likely related to AKI -Now on comfort measures  AKI on CKD stage III (baseline creatinine 1.4-1.6) -Continues to have elevated creatinine  Hypernatremia  COVID-19 infection -Currently on 5L [place] which will be weaned as he is on comfort care  Severe sepsis possibly related to UTI  Hypokalemia  Type 2 diabetes  Hypothyroidism  Patient continues on comfort measures   DVT prophylaxis:None Code Status:DNR/comfort measures Family Communication:Discussed with daughter on phone Disposition Plan:Continue on comfort care, anticipate  in-hospital death versus residential hospice should he be alive by 12/28.  Consultants:  Palliative care  Procedures:  None  Antimicrobials:  Anti-infectives (From admission, onward)   Start     Dose/Rate Route Frequency Ordered Stop   05/20/19 1600  ceFEPIme (MAXIPIME) 500 mg in dextrose 5 % 50 mL IVPB  Status:  Discontinued     500 mg 100 mL/hr over 30 Minutes Intravenous Every 24 hours 05/21/2019 1951 05/20/19 1026   05/20/19 1600  ceFEPIme (MAXIPIME) 1 g in sodium chloride 0.9 % 100 mL IVPB  Status:  Discontinued     1 g 200 mL/hr over 30 Minutes Intravenous Every 24 hours 05/20/19 1027 05/21/19 1141   05/20/2019 1515  ceFEPIme (MAXIPIME) 2 g in sodium chloride 0.9 % 100 mL IVPB     2 g 200 mL/hr over 30 Minutes Intravenous  Once 05/17/2019 1508 05/12/2019 1602   05/08/2019 1515  metroNIDAZOLE (FLAGYL) IVPB 500 mg     500 mg 100 mL/hr over 60 Minutes Intravenous  Once 05/18/2019 1508 05/05/2019 1718   05/04/2019 1515  vancomycin (VANCOCIN) IVPB 1000 mg/200 mL premix     1,000 mg 200 mL/hr over 60 Minutes Intravenous  Once 05/06/2019 1508 04/30/2019 1718       Subjective: Patient seen and evaluated today with no new acute complaints or concerns. No acute concerns or events noted overnight.  He was placed on 5 L nasal cannula oxygen.  Appears to have no new respiratory distress and is otherwise unresponsive.  Objective: Vitals:   05/23/19 1025 05/23/19 2100 05/23/19 2146 05/24/19 0541  BP: 99/64  101/60 110/66  Pulse: (!) 112 (!) 107 (!) 105 98  Resp: 18 (!) 28 (!) 22 20  Temp: 100 F (37.8 C)  97.8 F (36.6 C) 98.3 F (36.8 C)  TempSrc: Oral     SpO2: (!) 84% 90% 90% 98%  Weight:      Height:        Intake/Output Summary (Last 24 hours) at 05/24/2019 1244 Last data filed at 05/24/2019 0500 Gross per 24 hour  Intake --  Output 400 ml  Net -400 ml   Filed Weights   05/09/2019 2221 05/20/19 0416 05/21/19 0458  Weight: 66.4 kg 68.4 kg 69.9 kg    Examination:  General  exam: Appears calm and comfortable, unresponsive Respiratory system: Clear to auscultation. Respiratory effort normal.  Currently on 5 L nasal cannula oxygen Cardiovascular system: S1 & S2 heard, RRR. No JVD, murmurs, rubs, gallops or clicks. No pedal edema. Gastrointestinal system: Abdomen is nondistended, soft and nontender. No organomegaly or masses felt. Normal bowel sounds heard. Central nervous system: Unresponsive Extremities: No edema Skin: No rashes, lesions or ulcers Psychiatry: Cannot be assessed given patient condition    Data Reviewed: I have personally reviewed following labs and imaging studies  CBC: Recent Labs  Lab 05/10/2019 1527 05/20/19 0600 05/21/19 0626  WBC 11.3* 15.9* 8.7  NEUTROABS 9.3* 14.6*  --   HGB 16.7 14.3 11.5*  HCT 53.8* 46.2 37.3*  MCV 92.8 92.0 93.7  PLT 250 203 [id number]   Basic Metabolic Panel: Recent Labs  Lab 05/26/2019 1527 05/20/19 0600 05/21/19 0626  NA 157* 157* 157*  K 3.0* 3.1* 2.9*  CL 110 114* 117*  CO2 27 24 25   GLUCOSE 248* 289* 178*  BUN 109* 120* 128*  CREATININE 4.09* 4.58* 4.89*  CALCIUM 9.5 8.6* 8.2*  MG 3.2* 2.9* 2.7*  PHOS  --  5.3*  --    GFR: Estimated Creatinine Clearance: 9.5 mL/min (A) (by C-G formula based on SCr  of 4.89 mg/dL (H)). Liver Function Tests: Recent Labs  Lab 05/04/2019 1527 05/20/19 0600  AST 85* 61*  ALT 66* 58*  ALKPHOS 124 98  BILITOT 2.0* 1.5*  PROT 8.0 6.1*  ALBUMIN 3.5 2.6*   No results for input(s): LIPASE, AMYLASE in the last 168 hours. No results for input(s): AMMONIA in the last 168 hours. Coagulation Profile: No results for input(s): INR, PROTIME in the last 168 hours. Cardiac Enzymes: No results for input(s): CKTOTAL, CKMB, CKMBINDEX, TROPONINI in the last 168 hours. BNP (last 3 results) No results for input(s): PROBNP in the last 8760 hours. HbA1C: No results for input(s): HGBA1C in the last 72 hours. CBG: Recent Labs  Lab 05/20/19 1109 05/20/19 1640 05/20/19 2104  05/21/19 0749 05/21/19 1115  GLUCAP 228* 148* 156* 156* 155*   Lipid Profile: No results for input(s): CHOL, HDL, LDLCALC, TRIG, CHOLHDL, LDLDIRECT in the last 72 hours. Thyroid Function Tests: No results for input(s): TSH, T4TOTAL, FREET4, T3FREE, THYROIDAB in the last 72 hours. Anemia Panel: No results for input(s): VITAMINB12, FOLATE, FERRITIN, TIBC, IRON, RETICCTPCT in the last 72 hours. Sepsis Labs: Recent Labs  Lab 05/11/2019 1525 05/20/2019 1529 05/21/2019 1704 05/21/19 0626  PROCALCITON  --  0.13  --   --   LATICACIDVEN 2.9*  --  1.6 1.5    Recent Results (from the past 240 hour(s))  Blood Culture (routine x 2)     Status: None   Collection Time: 05/20/2019  3:27 PM   Specimen: Right Antecubital; Blood  Result Value Ref Range Status   Specimen Description RIGHT ANTECUBITAL  Final   Special Requests   Final    BOTTLES DRAWN AEROBIC AND ANAEROBIC Blood Culture adequate volume   Culture   Final    NO GROWTH 5 DAYS Performed at [facility], [address] [place], [place] [address]    Report Status 05/24/2019 FINAL  Final  Blood Culture (routine x 2)     Status: None   Collection Time: 05/09/2019  3:28 PM   Specimen: BLOOD LEFT WRIST  Result Value Ref Range Status   Specimen Description BLOOD LEFT WRIST  Final   Special Requests   Final    BOTTLES DRAWN AEROBIC AND ANAEROBIC Blood Culture adequate volume   Culture   Final    NO GROWTH 5 DAYS Performed at [facility], [address] [place], [place] [address]    Report Status 05/24/2019 FINAL  Final  Respiratory Panel by RT PCR (Flu A&B, Covid) - Nasopharyngeal Swab     Status: Abnormal   Collection Time: 05/21/19  2:47 PM   Specimen: Nasopharyngeal Swab  Result Value Ref Range Status   SARS Coronavirus 2 by RT PCR POSITIVE (A) NEGATIVE Final    Comment: RESULT CALLED TO, READ BACK BY AND VERIFIED WITH: RENALDS,RN AT 1626 ON 12.23.20 BY ISLEY,B (NOTE) SARS-CoV-2 target nucleic acids are DETECTED. SARS-CoV-2 RNA is  generally detectable in upper respiratory specimens  during the acute phase of infection. Positive results are indicative of the presence of the identified virus, but do not rule out bacterial infection or co-infection with other pathogens not detected by the test. Clinical correlation with patient history and other diagnostic information is necessary to determine patient infection status. The expected result is Negative. Fact Sheet for Patients:  PinkCheek.be Fact Sheet for Healthcare Providers: GravelBags.it This test is not yet approved or cleared by the [place] FDA and  has been authorized for detection and/or diagnosis of SARS-CoV-2 by FDA  under an Emergency Use Authorization (EUA).  This EUA will remain in effect (meaning this test can be u sed) for the duration of  the COVID-19 declaration under Section 564(b)(1) of the Act, 21 U.S.C. section 360bbb-3(b)(1), unless the authorization is terminated or revoked sooner.    Influenza A by PCR NEGATIVE NEGATIVE Final   Influenza B by PCR NEGATIVE NEGATIVE Final    Comment: (NOTE) The Xpert Xpress SARS-CoV-2/FLU/RSV assay is intended as an aid in  the diagnosis of influenza from Nasopharyngeal swab specimens and  should not be used as a sole basis for treatment. Nasal washings and  aspirates are unacceptable for Xpert Xpress SARS-CoV-2/FLU/RSV  testing. Fact Sheet for Patients: PinkCheek.be Fact Sheet for Healthcare Providers: GravelBags.it This test is not yet approved or cleared by the [place] FDA and  has been authorized for detection and/or diagnosis of SARS-CoV-2 by  FDA under an Emergency Use Authorization (EUA). This EUA will remain  in effect (meaning this test can be used) for the duration of the  Covid-19 declaration under Section 564(b)(1) of the Act, 21  U.S.C. section 360bbb-3(b)(1), unless the  authorization is  terminated or revoked. Performed at [facility], [address] [place], [place] [address]          Radiology Studies: No results found.      Scheduled Meds: . mouth rinse  15 mL Mouth Rinse BID  . oxyCODONE  5 mg Oral Q4H  . sodium chloride flush  3 mL Intravenous Q12H   Continuous Infusions:   LOS: 5 days    Time spent: 30 minutes    Kevontay Burks Darleen Crocker, DO [facility] Pager [phone]  If 7PM-7AM, please contact night-coverage [email or web address] Password [facility] [place] 05/24/2019, 12:44 PM

## 2019-05-25 DIAGNOSIS — N1832 Chronic kidney disease, stage 3b: Secondary | ICD-10-CM

## 2019-05-25 DIAGNOSIS — E860 Dehydration: Secondary | ICD-10-CM

## 2019-05-25 DIAGNOSIS — N179 Acute kidney failure, unspecified: Secondary | ICD-10-CM

## 2019-05-25 DIAGNOSIS — E870 Hyperosmolality and hypernatremia: Secondary | ICD-10-CM

## 2019-05-25 DIAGNOSIS — Z7189 Other specified counseling: Secondary | ICD-10-CM

## 2019-05-25 DIAGNOSIS — U071 COVID-19: Secondary | ICD-10-CM

## 2019-05-30 NOTE — Discharge Summary (Signed)
Death Summary  [name] [id number] DOB: [birth year] DOA: Jun 16, 2019  PCP: Janora Norlander, DO PCP/Office notified: notified through [place].  Admit date: 2019-06-16 Date of Death: Jun 22, 2019  Final Diagnoses:  Principal Problem:   Acute metabolic encephalopathy Active Problems:   Dehydration   Hypokalemia   COVID-19 virus infection   AKI (acute kidney injury) (HCC)   Hypernatremia   Acute renal failure superimposed on chronic kidney disease (HCC)   Goals of care, counseling/discussion   Advanced care planning/counseling discussion   Palliative care by specialist   Comfort measures only status Multiple pressure injuries stage 1 and stage 2 (present prior to admission)   History of present illness:  Per HPI: Craig H Kendrickis a 86 y.o.malewith medical history significant foradvanced dementia,non-verbal at baseline,coronary artery disease status post PCI in 1996, stage III chronic kidney disease with baseline creatinine of 1.4-1.6, type 2 diabetes mellitus, hypertension, acquired hypothyroidism, who is admitted to [facility] on June 16, 2019 with acute metabolic encephalopathy after presenting from [place] emergency department for further evaluation of increased somnolence.  In the setting of the patient's advanced dementia, the following history is provided by the patient'sdaughter [name] (POA),my discussions with the emergency department physician, and via chart review.  The patient has reportedly been demonstrating to 3 days of increased somnolence, sleeping several hours more relative to baseline sleep requirements. This period time is also been associated with diminished oral intake.Not associated with any vomiting or diarrhea. Additionally, SNFstaff has reportedly noted generalized weakness over the last week.Specifically, the patient reportedly ambulates with a walker at baseline, but in the setting of progressive generalized weakness over  the last week, has been too weak to ambulate at all.Outpatient COVID-19 testing performed on 05/12/2019 was found to be positive, and confirmed via FAX sent by [facility] emergency department this evening.The patient has a distant smoking history, having reportedly he completely quit smoking in the 1940s, and possesses no known chronic underlying pulmonary conditions. Per discussions with the patient's POA, the patient is to be full code.  Hospital Course:  12/22:Patient was admitted with acute metabolic encephalopathy in the setting of advanced dementia and likely related to AKI on CKD stage III. He also may have a UTI and has been started on cefepime empirically. He continues to remain encephalopathic and I have had discussion with his daughter who is now agreeable to DNR. Palliative care consultation ordered and pending. Continue IV fluids and monitor labs. Further work-up of AKI pending. Avoid nephrology consultation for now.  12/23:Patient continues to have worsening renal function and little to no urine output. His blood pressures are soft and he is not eating or responding very much. I have discussed this with family members as well as with palliative care and patient is now comfort measures.  12/24-12/26:Patient remains on comfort measures with no acute events noted overnight.  He was placed on 5 L nasal cannula overnight which will be gradually weaned as he is on comfort protocol.  2023-06-22: while receiving comfort measures around 8:50 AM patient peacefully passed away.  Hospital course by problem -Acute metabolic encephalopathy in the setting of advanced dementia acute relationship and worsening mentation due to uremia for an acute kidney injury. -Patient was no eating or drinking -He was kept on comfort measurements after discussion with preventive care no family -No further blood work-up anticipated -Baseline chronic kidney disease stage IIIb with  creatinine 1.4-1.6 level. -Despite fluid resuscitation and minimizing nephrotoxic agents patient renal function continued to  decline. -Not a candidate for dialysis.  Hyponatremia/dehydration -Patient received fluid resuscitation -Minimal improvement in his electrolytes impairments -After he was transitioned to full comfort no further blood work were taken.  COVID-19 infection -Requiring 5 L nasal cannula supplementation -Patient received treatment initially with remdesivir and steroids -Transition to call for measurements received morphine for air hunger. -Oxygen supplementation was weaned for comfort.  Severe sepsis possibly with relation to UTI -Cultures pending at time of passing away -Initially covered with IV antibiotics -Transition to comfort measures antibiotic therapy discontinued  Multiple pressure injuries (stage II left hip, stage I right heel, stage I right heel and a stage I bilateral buttocks) -Injuries were present prior to admission -Preventive measures will follow with repositioning and barrier creams.   Time: 25 minutes.  Signed:  Barton Dubois  [facility] 06/19/19, 10:52 AM

## 2019-05-30 NOTE — Progress Notes (Signed)
   May 27, 2019 0900  Attending [place]  Attending Physician Notified Y  Attending Physician (First and Last Name) Dillard Cannon the above attending physician sign death certificate? Yes  Post Mortem Checklist  Date of Death 2019/05/27  Time of Death 0850  Pronounced By L Laloni [place]ton/ P [place]  Next of kin notified Yes  Contact Person's Relationship to Patient Daughter  Contact Person's Phone Number [phone]  Was the patient a No Code Blue or a Limited Code Blue? Yes  Did the patient die unattended? Yes  Patient restrained? Not applicable  Height 5\' 11"  (1.803 m)  Weight 69.9 kg  [place] [facility]  Notification Date 2019-05-27  Notification Time [place] [facility] Number [id number]  Is patient a potential donor? N  Autopsy  Autopsy requested by N/A  Patient Belongings/Medications Returned  Patient belongings from bedside/safe/pharmacy returned  None  Dead on Arrival (Emergency Department)  Patient dead on arrival? No  Notifications  Patient Placement notified that Post Mortem checklist is complete Yes  Patient Placement notified body transferred Transported to [place]  [place] Notified Y  [place] - Name of person who notified funeral home [place]  Anton Ruiz - Date funeral home notified 2019/05/27  [place] - Time funeral home notified [phone]  [place] - Name of person at funeral home with whom you spoke [employer]  Is this a medical examiner's case? [facility] home name/address/phone # [place] [phone]  Planned location of pickup [place]

## 2019-05-30 DEATH — deceased
